# Patient Record
Sex: Male | Born: 1945
Health system: Southern US, Community
[De-identification: ages and names within clinical notes are randomized; demographics above are authoritative.]

## PROBLEM LIST (undated history)

## (undated) DIAGNOSIS — I77819 Aortic ectasia, unspecified site: Secondary | ICD-10-CM

## (undated) DIAGNOSIS — I10 Essential (primary) hypertension: Secondary | ICD-10-CM

## (undated) DIAGNOSIS — I77810 Thoracic aortic ectasia: Secondary | ICD-10-CM

## (undated) DIAGNOSIS — M1990 Unspecified osteoarthritis, unspecified site: Secondary | ICD-10-CM

## (undated) DIAGNOSIS — C801 Malignant (primary) neoplasm, unspecified: Secondary | ICD-10-CM

## (undated) DIAGNOSIS — I4510 Unspecified right bundle-branch block: Secondary | ICD-10-CM

## (undated) DIAGNOSIS — I5032 Chronic diastolic (congestive) heart failure: Secondary | ICD-10-CM

## (undated) DIAGNOSIS — K219 Gastro-esophageal reflux disease without esophagitis: Secondary | ICD-10-CM

## (undated) DIAGNOSIS — J45909 Unspecified asthma, uncomplicated: Secondary | ICD-10-CM

## (undated) DIAGNOSIS — G4733 Obstructive sleep apnea (adult) (pediatric): Secondary | ICD-10-CM

## (undated) DIAGNOSIS — I480 Paroxysmal atrial fibrillation: Secondary | ICD-10-CM

## (undated) HISTORY — DX: Chronic diastolic (congestive) heart failure: I50.32

## (undated) HISTORY — DX: Morbid (severe) obesity due to excess calories: E66.01

## (undated) HISTORY — PX: SHOULDER SURGERY: SHX246

## (undated) HISTORY — PX: CATARACT EXTRACTION W/ INTRAOCULAR LENS IMPLANT: SHX1309

## (undated) HISTORY — DX: Paroxysmal atrial fibrillation: I48.0

## (undated) HISTORY — PX: OTHER SURGICAL HISTORY: SHX169

## (undated) HISTORY — DX: Obstructive sleep apnea (adult) (pediatric): G47.33

## (undated) HISTORY — DX: Thoracic aortic ectasia: I77.810

## (undated) HISTORY — DX: Unspecified right bundle-branch block: I45.10

## (undated) HISTORY — DX: Aortic ectasia, unspecified site: I77.819

---

## 1998-02-22 ENCOUNTER — Emergency Department (HOSPITAL_COMMUNITY): Admission: EM | Admit: 1998-02-22 | Discharge: 1998-02-22 | Payer: Self-pay | Admitting: Emergency Medicine

## 2001-01-03 ENCOUNTER — Encounter: Payer: Self-pay | Admitting: Emergency Medicine

## 2001-01-03 ENCOUNTER — Emergency Department (HOSPITAL_COMMUNITY): Admission: EM | Admit: 2001-01-03 | Discharge: 2001-01-03 | Payer: Self-pay | Admitting: Emergency Medicine

## 2004-11-02 ENCOUNTER — Inpatient Hospital Stay (HOSPITAL_COMMUNITY): Admission: EM | Admit: 2004-11-02 | Discharge: 2004-11-11 | Payer: Self-pay | Admitting: Emergency Medicine

## 2005-06-18 ENCOUNTER — Encounter: Admission: RE | Admit: 2005-06-18 | Discharge: 2005-06-18 | Payer: Self-pay | Admitting: Internal Medicine

## 2005-10-13 ENCOUNTER — Encounter: Admission: RE | Admit: 2005-10-13 | Discharge: 2005-10-13 | Payer: Self-pay | Admitting: Gastroenterology

## 2006-10-21 ENCOUNTER — Encounter: Admission: RE | Admit: 2006-10-21 | Discharge: 2006-10-21 | Payer: Self-pay | Admitting: Internal Medicine

## 2010-08-30 ENCOUNTER — Encounter: Payer: Self-pay | Admitting: Gastroenterology

## 2012-08-16 ENCOUNTER — Other Ambulatory Visit: Payer: Self-pay | Admitting: Orthopedic Surgery

## 2012-08-16 DIAGNOSIS — M25512 Pain in left shoulder: Secondary | ICD-10-CM

## 2012-08-17 ENCOUNTER — Ambulatory Visit
Admission: RE | Admit: 2012-08-17 | Discharge: 2012-08-17 | Disposition: A | Discharge status: Home / Self Care | Payer: Medicare Other | Source: Ambulatory Visit | Attending: Orthopedic Surgery | Admitting: Orthopedic Surgery

## 2012-08-17 DIAGNOSIS — M25512 Pain in left shoulder: Secondary | ICD-10-CM

## 2012-08-24 ENCOUNTER — Ambulatory Visit
Admission: RE | Admit: 2012-08-24 | Discharge: 2012-08-24 | Disposition: A | Discharge status: Home / Self Care | Payer: Medicare Other | Source: Ambulatory Visit | Attending: Orthopedic Surgery | Admitting: Orthopedic Surgery

## 2012-11-29 ENCOUNTER — Emergency Department (HOSPITAL_COMMUNITY): Payer: Medicare Other

## 2012-11-29 ENCOUNTER — Inpatient Hospital Stay (HOSPITAL_COMMUNITY)
Admission: EM | Admit: 2012-11-29 | Discharge: 2012-12-02 | DRG: 419 | Disposition: A | Discharge status: Home / Self Care | Payer: Medicare Other | Attending: General Surgery | Admitting: General Surgery

## 2012-11-29 ENCOUNTER — Encounter (HOSPITAL_COMMUNITY): Payer: Self-pay | Admitting: *Deleted

## 2012-11-29 DIAGNOSIS — K8000 Calculus of gallbladder with acute cholecystitis without obstruction: Secondary | ICD-10-CM

## 2012-11-29 DIAGNOSIS — G4733 Obstructive sleep apnea (adult) (pediatric): Secondary | ICD-10-CM | POA: Diagnosis present

## 2012-11-29 DIAGNOSIS — Z7982 Long term (current) use of aspirin: Secondary | ICD-10-CM

## 2012-11-29 DIAGNOSIS — Z79899 Other long term (current) drug therapy: Secondary | ICD-10-CM

## 2012-11-29 DIAGNOSIS — K810 Acute cholecystitis: Principal | ICD-10-CM

## 2012-11-29 DIAGNOSIS — J45909 Unspecified asthma, uncomplicated: Secondary | ICD-10-CM | POA: Diagnosis present

## 2012-11-29 HISTORY — DX: Unspecified asthma, uncomplicated: J45.909

## 2012-11-29 LAB — CBC WITH DIFFERENTIAL/PLATELET
Eosinophils Relative: 0 % (ref 0–5)
HCT: 43.7 % (ref 39.0–52.0)
Hemoglobin: 15.1 g/dL (ref 13.0–17.0)
Lymphs Abs: 0.8 10*3/uL (ref 0.7–4.0)
MCH: 28.1 pg (ref 26.0–34.0)
MCV: 81.2 fL (ref 78.0–100.0)
Monocytes Absolute: 2 10*3/uL — ABNORMAL HIGH (ref 0.1–1.0)
Neutro Abs: 17.3 10*3/uL — ABNORMAL HIGH (ref 1.7–7.7)
Platelets: 208 10*3/uL (ref 150–400)
RBC: 5.38 MIL/uL (ref 4.22–5.81)
RDW: 13.9 % (ref 11.5–15.5)
WBC: 20.1 10*3/uL — ABNORMAL HIGH (ref 4.0–10.5)

## 2012-11-29 LAB — COMPREHENSIVE METABOLIC PANEL
ALT: 21 U/L (ref 0–53)
AST: 20 U/L (ref 0–37)
Alkaline Phosphatase: 64 U/L (ref 39–117)
BUN: 24 mg/dL — ABNORMAL HIGH (ref 6–23)
CO2: 22 mEq/L (ref 19–32)
Calcium: 9.1 mg/dL (ref 8.4–10.5)
Creatinine, Ser: 0.96 mg/dL (ref 0.50–1.35)
Glucose, Bld: 140 mg/dL — ABNORMAL HIGH (ref 70–99)
Potassium: 3.8 mEq/L (ref 3.5–5.1)
Sodium: 132 mEq/L — ABNORMAL LOW (ref 135–145)
Total Protein: 6.9 g/dL (ref 6.0–8.3)

## 2012-11-29 MED ORDER — HYDROMORPHONE HCL PF 1 MG/ML IJ SOLN
1.0000 mg | Freq: Once | INTRAMUSCULAR | Status: AC
  Administered 2012-11-29: 1 mg via INTRAVENOUS
  Filled 2012-11-29: qty 1

## 2012-11-29 MED ORDER — PIPERACILLIN-TAZOBACTAM 3.375 G IVPB 30 MIN
3.3750 g | Freq: Once | INTRAVENOUS | Status: AC
  Administered 2012-11-29: 3.375 g via INTRAVENOUS
  Filled 2012-11-29: qty 50

## 2012-11-29 MED ORDER — MORPHINE SULFATE 2 MG/ML IJ SOLN
2.0000 mg | INTRAMUSCULAR | Status: DC | PRN
  Administered 2012-11-29 – 2012-11-30 (×2): 2 mg via INTRAVENOUS
  Filled 2012-11-29 (×2): qty 1

## 2012-11-29 MED ORDER — ONDANSETRON HCL 4 MG/2ML IJ SOLN
4.0000 mg | Freq: Once | INTRAMUSCULAR | Status: AC
  Administered 2012-11-29: 4 mg via INTRAVENOUS
  Filled 2012-11-29: qty 2

## 2012-11-29 MED ORDER — PIPERACILLIN-TAZOBACTAM 3.375 G IVPB 30 MIN: 3.3750 g | Freq: Once | INTRAVENOUS | Status: DC

## 2012-11-29 MED ORDER — ACETAMINOPHEN 650 MG RE SUPP: 650.0000 mg | Freq: Four times a day (QID) | RECTAL | Status: DC | PRN

## 2012-11-29 MED ORDER — SODIUM CHLORIDE 0.9 % IV BOLUS (SEPSIS)
1000.0000 mL | Freq: Once | INTRAVENOUS | Status: AC
  Administered 2012-11-29: 1000 mL via INTRAVENOUS

## 2012-11-29 MED ORDER — ONDANSETRON HCL 4 MG/2ML IJ SOLN
4.0000 mg | Freq: Four times a day (QID) | INTRAMUSCULAR | Status: DC | PRN
  Administered 2012-12-01: 4 mg via INTRAVENOUS
  Filled 2012-11-29: qty 2

## 2012-11-29 MED ORDER — PIPERACILLIN-TAZOBACTAM 3.375 G IVPB
3.3750 g | Freq: Three times a day (TID) | INTRAVENOUS | Status: DC
  Administered 2012-11-30 – 2012-12-02 (×7): 3.375 g via INTRAVENOUS
  Filled 2012-11-29 (×10): qty 50

## 2012-11-29 MED ORDER — HEPARIN SODIUM (PORCINE) 5000 UNIT/ML IJ SOLN
5000.0000 [IU] | Freq: Three times a day (TID) | INTRAMUSCULAR | Status: DC
  Administered 2012-11-30 – 2012-12-02 (×6): 5000 [IU] via SUBCUTANEOUS
  Filled 2012-11-29 (×12): qty 1

## 2012-11-29 MED ORDER — PANTOPRAZOLE SODIUM 40 MG IV SOLR
40.0000 mg | Freq: Every day | INTRAVENOUS | Status: DC
  Administered 2012-11-30 – 2012-12-01 (×3): 40 mg via INTRAVENOUS
  Filled 2012-11-29 (×4): qty 40

## 2012-11-29 MED ORDER — ACETAMINOPHEN 325 MG PO TABS: 650.0000 mg | ORAL_TABLET | Freq: Four times a day (QID) | ORAL | Status: DC | PRN

## 2012-11-29 MED ORDER — SODIUM CHLORIDE 0.9 % IV SOLN
INTRAVENOUS | Status: DC
  Administered 2012-11-30 (×2): 75 mL/h via INTRAVENOUS
  Administered 2012-12-02: 09:00:00 via INTRAVENOUS

## 2012-11-29 NOTE — ED Notes (Signed)
Pt has CBC and urinalysis results from today here with him.

## 2012-11-29 NOTE — H&P (Addendum)
Victor Medina is an 67 y.o. male.   Chief Complaint: ruq pain, n/v HPI: 34 yom who presents with first episode of ruq pain.  He ate dinner on Easter and then had n/v overnight.  Since then he has had ruq pain that has been unrelenting and worsening.  He has not had more n/v.  Has had low grade fevers.  There have been no relieving factors.  Aggravated by palpation.  He has not really eaten anything since Sunday either.  He denies any prior symptoms.    Past Medical History  Diagnosis Date  . Asthma     Past Surgical History  Procedure Laterality Date  . Shoulder surgery    recently with Dr. Ave Filter  History reviewed. No pertinent family history. Social History:  reports that he has never smoked. He does not have any smokeless tobacco history on file. He reports that  drinks alcohol. He reports that he does not use illicit drugs.  Allergies: No Known Allergies  Meds advair  Results for orders placed during the hospital encounter of 11/29/12 (from the past 48 hour(s))  COMPREHENSIVE METABOLIC PANEL     Status: Abnormal   Collection Time    11/29/12  7:44 PM      Result Value Range   Sodium 132 (*) 135 - 145 mEq/L   Potassium 3.8  3.5 - 5.1 mEq/L   Chloride 96  96 - 112 mEq/L   CO2 22  19 - 32 mEq/L   Glucose, Bld 140 (*) 70 - 99 mg/dL   BUN 24 (*) 6 - 23 mg/dL   Creatinine, Ser 1.61  0.50 - 1.35 mg/dL   Calcium 9.1  8.4 - 09.6 mg/dL   Total Protein 6.9  6.0 - 8.3 g/dL   Albumin 3.3 (*) 3.5 - 5.2 g/dL   AST 20  0 - 37 U/L   ALT 21  0 - 53 U/L   Alkaline Phosphatase 64  39 - 117 U/L   Total Bilirubin 1.6 (*) 0.3 - 1.2 mg/dL   GFR calc non Af Amer 84 (*) >90 mL/min   GFR calc Af Amer >90  >90 mL/min   Comment:            The eGFR has been calculated     using the CKD EPI equation.     This calculation has not been     validated in all clinical     situations.     eGFR's persistently     <90 mL/min signify     possible Chronic Kidney Disease.  LIPASE, BLOOD      Status: None   Collection Time    11/29/12  7:44 PM      Result Value Range   Lipase 11  11 - 59 U/L  CBC WITH DIFFERENTIAL     Status: Abnormal   Collection Time    11/29/12  9:09 PM      Result Value Range   WBC 20.1 (*) 4.0 - 10.5 K/uL   Comment: WHITE COUNT CONFIRMED ON SMEAR   RBC 5.38  4.22 - 5.81 MIL/uL   Hemoglobin 15.1  13.0 - 17.0 g/dL   HCT 04.5  40.9 - 81.1 %   MCV 81.2  78.0 - 100.0 fL   MCH 28.1  26.0 - 34.0 pg   MCHC 34.6  30.0 - 36.0 g/dL   RDW 91.4  78.2 - 95.6 %   Platelets 208  150 - 400 K/uL  Neutrophils Relative 86 (*) 43 - 77 %   Lymphocytes Relative 4 (*) 12 - 46 %   Monocytes Relative 10  3 - 12 %   Eosinophils Relative 0  0 - 5 %   Basophils Relative 0  0 - 1 %   Smear Review MORPHOLOGY UNREMARKABLE     Neutro Abs 17.3 (*) 1.7 - 7.7 K/uL   Lymphs Abs 0.8  0.7 - 4.0 K/uL   Monocytes Absolute 2.0 (*) 0.1 - 1.0 K/uL   Eosinophils Absolute 0.0  0.0 - 0.7 K/uL   Basophils Absolute 0.0  0.0 - 0.1 K/uL   US Abdomen Complete  11/29/2012  *RADIOLOGY REPORT*  Clinical Data:  Right upper quadrant abdominal pain  COMPLETE ABDOMINAL ULTRASOUND  Comparison:  None.  Findings:  Gallbladder:  The gallbladder is distended with echogenic debris. There is gallbladder wall thickening at 7 mm.  Negative Murphy's sign per the sonographer.  Common bile duct:  Proximally measures 4.5 mm. Distal duct obscured.  Liver:  Enlarged and increased in echogenicity.  Focal lesion detection is limited in this setting.  IVC:  Poorly visualized.  Pancreas:  Inadequately visualized due to limited acoustic windows/bowel gas artifact.  Spleen:  Measures 9.2 cm.  Right Kidney:  Measures 11.7 cm.  No hydronephrosis or focal abnormality.  Left Kidney:  Measures 12 cm.  No hydronephrosis or focal abnormality.  Abdominal aorta:  Poorly visualized due to limited acoustic windows/bowel gas artifact.  The mid/distal aorta was obscured. Proximally, measures approximately 2.6 cm.  IMPRESSION: Technically  challenging examination due to patient body habitus and bowel gas artifact, resulting limited acoustic windows as above.  The gallbladder is distended with echogenic debris (sludge , blood, or pus) and thick-walled.  No shadowing gallstones. Acute cholecystitis not excluded.  Hepatic steatosis.   Original Report Authenticated By: Jearld Lesch, M.D.     Review of Systems  Constitutional: Positive for fever and malaise/fatigue. Negative for chills.  Respiratory: Negative for cough.   Cardiovascular: Positive for leg swelling. Negative for chest pain and palpitations.  Gastrointestinal: Positive for nausea, vomiting and abdominal pain.    Blood pressure 136/76, pulse 96, temperature 100.8 F (38.2 C), temperature source Oral, resp. rate 20, SpO2 99.00%. Physical Exam  Vitals reviewed. Constitutional: He is oriented to person, place, and time. He appears well-developed and well-nourished.  Eyes: No scleral icterus.  Neck: Neck supple.  Cardiovascular: Normal rate, regular rhythm and normal heart sounds.   Respiratory: Effort normal and breath sounds normal. He has no wheezes.  GI: Normal appearance and bowel sounds are normal. He exhibits no distension. There is tenderness in the right upper quadrant. There is positive Murphy's sign.  Musculoskeletal: Normal range of motion. He exhibits edema.  Lymphadenopathy:    He has no cervical adenopathy.  Neurological: He is alert and oriented to person, place, and time.  Skin: He is not diaphoretic.     Assessment/Plan Acute cholecystitis  I discussed the procedure in detail.  Will place on antibiotics, npo plan lap chole possible open chole which I quoted 10-20% risk of.  We discussed the risks and benefits of a laparoscopic cholecystectomy and possible cholangiogram including, but not limited to bleeding, infection, injury to surrounding structures such as the intestine or liver, bile leak, retained gallstones, need to convert to an open  procedure, prolonged diarrhea, blood clots such as  DVT, common bile duct injury, anesthesia risks, and possible need for additional procedures.  The likelihood of improvement in symptoms  and return to the patient's normal status is good. We discussed the typical post-operative recovery course.   Victor Medina 11/29/2012, 11:25 PM

## 2012-11-29 NOTE — ED Notes (Signed)
Surgery in to assess - wife remains at bedside

## 2012-11-29 NOTE — ED Notes (Signed)
Pt began vomiting Sunday night through Monday after traveling for Easter.  Since then, has very little appetite and RLQ abdominal pain.  Denies n/v/d at this time.

## 2012-11-29 NOTE — ED Provider Notes (Signed)
History     CSN: [id number]  Arrival date & time 11/29/12  4098   First MD Initiated Contact with Patient 11/29/12 2049      Chief Complaint  Patient presents with  . Abdominal Pain    (Consider location/radiation/quality/duration/timing/severity/associated sxs/prior treatment) Patient is a 67 y.o. male presenting with abdominal pain. The history is provided by the patient and the spouse.  Abdominal Pain Pain location:  RUQ Pain quality: aching and fullness   Pain radiates to:  Does not radiate Pain severity:  Moderate Onset quality:  Gradual Duration:  3 days Timing:  Constant Progression:  Worsening Chronicity:  New Context comment:  Biliary colic 40 yrs ago Worsened by:  Palpation and movement Ineffective treatments:  None tried Associated symptoms: anorexia, chills, fatigue, fever and nausea   Associated symptoms: no chest pain, no constipation, no cough, no diarrhea, no shortness of breath and no vomiting   Risk factors: obesity     Past Medical History  Diagnosis Date  . Asthma     Past Surgical History  Procedure Laterality Date  . Shoulder surgery      History reviewed. No pertinent family history.  History  Substance Use Topics  . Smoking status: Never Smoker   . Smokeless tobacco: Not on file  . Alcohol Use: Yes     Comment: rarely      Review of Systems  Constitutional: Positive for fever, chills, activity change, appetite change and fatigue. Negative for diaphoresis.  HENT: Negative for neck pain.   Respiratory: Negative for cough, chest tightness, shortness of breath and wheezing.   Cardiovascular: Negative for chest pain, palpitations and leg swelling.  Gastrointestinal: Positive for nausea, abdominal pain (RUQ) and anorexia. Negative for vomiting, diarrhea, constipation, blood in stool, abdominal distention and anal bleeding.  Skin: Negative for rash and wound.  Neurological: Negative for dizziness, syncope, weakness, light-headedness and  headaches.  All other systems reviewed and are negative.    Allergies  Review of patient's allergies indicates no known allergies.  Home Medications   Current Outpatient Rx  Name  Route  Sig  Dispense  Refill  . Fluticasone-Salmeterol (ADVAIR) 250-50 MCG/DOSE AEPB   Inhalation   Inhale 1 puff into the lungs every 12 (twelve) hours.           BP 129/72  Pulse 100  Temp(Src) 98.8 F (37.1 C) (Oral)  Resp 20  SpO2 97%  Physical Exam  Nursing note and vitals reviewed. Constitutional: He appears well-developed and well-nourished.  HENT:  Head: Normocephalic and atraumatic.  Right Ear: External ear normal.  Left Ear: External ear normal.  Nose: Nose normal.  Mouth/Throat: Oropharynx is clear and moist. No oropharyngeal exudate.  Scleral icterus   Eyes: Conjunctivae are normal. Pupils are equal, round, and reactive to light.  Neck: Normal range of motion. Neck supple.  Cardiovascular: Normal rate, regular rhythm, normal heart sounds and intact distal pulses.  Exam reveals no gallop and no friction rub.   No murmur heard. Pulmonary/Chest: Effort normal and breath sounds normal. No respiratory distress. He has no wheezes. He has no rales. He exhibits no tenderness.  Abdominal: Soft. Bowel sounds are normal. He exhibits no distension and no mass. There is tenderness (RUQ; Positive Murphy's sign). There is no rebound and no guarding.  Musculoskeletal: Normal range of motion. He exhibits no edema and no tenderness.  Neurological: He is alert. He displays normal reflexes. No cranial nerve deficit. He exhibits normal muscle tone. Coordination normal.  Skin: Skin  is warm and dry. No rash noted. No erythema. No pallor.  Psychiatric: He has a normal mood and affect. His behavior is normal. Judgment and thought content normal.    ED Course  Procedures (including critical care time)  Labs Reviewed  COMPREHENSIVE METABOLIC PANEL - Abnormal; Notable for the following:    Sodium 132  (*)    Glucose, Bld 140 (*)    BUN 24 (*)    Albumin 3.3 (*)    Total Bilirubin 1.6 (*)    GFR calc non Af Amer 84 (*)    All other components within normal limits  CBC WITH DIFFERENTIAL - Abnormal; Notable for the following:    WBC 20.1 (*)    Neutrophils Relative 86 (*)    Lymphocytes Relative 4 (*)    Neutro Abs 17.3 (*)    Monocytes Absolute 2.0 (*)    All other components within normal limits  CULTURE, BLOOD (ROUTINE X 2)  CULTURE, BLOOD (ROUTINE X 2)  LIPASE, BLOOD    Date: 11/30/2012  Rate: 95 bpm  Rhythm: normal sinus rhythm  QRS Axis: left  Intervals: normal  ST/T Wave abnormalities: normal  Conduction Disutrbances:right bundle branch block  Narrative Interpretation: Normal sinus rhythm with right bundle branch block  Old EKG Reviewed: none available    US Abdomen Complete  11/29/2012  *RADIOLOGY REPORT*  Clinical Data:  Right upper quadrant abdominal pain  COMPLETE ABDOMINAL ULTRASOUND  Comparison:  None.  Findings:  Gallbladder:  The gallbladder is distended with echogenic debris. There is gallbladder wall thickening at 7 mm.  Negative Murphy's sign per the sonographer.  Common bile duct:  Proximally measures 4.5 mm. Distal duct obscured.  Liver:  Enlarged and increased in echogenicity.  Focal lesion detection is limited in this setting.  IVC:  Poorly visualized.  Pancreas:  Inadequately visualized due to limited acoustic windows/bowel gas artifact.  Spleen:  Measures 9.2 cm.  Right Kidney:  Measures 11.7 cm.  No hydronephrosis or focal abnormality.  Left Kidney:  Measures 12 cm.  No hydronephrosis or focal abnormality.  Abdominal aorta:  Poorly visualized due to limited acoustic windows/bowel gas artifact.  The mid/distal aorta was obscured. Proximally, measures approximately 2.6 cm.  IMPRESSION: Technically challenging examination due to patient body habitus and bowel gas artifact, resulting limited acoustic windows as above.  The gallbladder is distended with echogenic  debris (sludge , blood, or pus) and thick-walled.  No shadowing gallstones. Acute cholecystitis not excluded.  Hepatic steatosis.   Original Report Authenticated By: Jearld Lesch, M.D.      1. Acute cholecystitis     MDM  67 yo M presents for 4 days of RUQ abdominal pain with associated nausea/vomiting. Limited PO intake since onset of symptoms. AFVSS. Scleral icterus on exam. TTP exquisitely over RUQ. Not peritonic. Concern for acute cholangitis vs acute cholecystitis; will obtain labs, RUQ ultrasound, and administer IVF, analgesics, anti-emetics.   Imaging c/w acute cholecystitis. Blood cultures obtained and IV Zosyn administered. General Surgery to admit.         Clemetine Marker, MD 11/30/12 [phone]

## 2012-11-30 ENCOUNTER — Encounter (HOSPITAL_COMMUNITY): Payer: Self-pay | Admitting: *Deleted

## 2012-11-30 ENCOUNTER — Inpatient Hospital Stay (HOSPITAL_COMMUNITY): Payer: Medicare Other

## 2012-11-30 LAB — COMPREHENSIVE METABOLIC PANEL
AST: 75 U/L — ABNORMAL HIGH (ref 0–37)
Albumin: 3 g/dL — ABNORMAL LOW (ref 3.5–5.2)
BUN: 26 mg/dL — ABNORMAL HIGH (ref 6–23)
Calcium: 8.6 mg/dL (ref 8.4–10.5)
Creatinine, Ser: 1.03 mg/dL (ref 0.50–1.35)
Total Bilirubin: 1.9 mg/dL — ABNORMAL HIGH (ref 0.3–1.2)
Total Protein: 6.3 g/dL (ref 6.0–8.3)

## 2012-11-30 LAB — CBC
Hemoglobin: 14.9 g/dL (ref 13.0–17.0)
MCH: 28.6 pg (ref 26.0–34.0)
MCV: 82.5 fL (ref 78.0–100.0)
Platelets: 209 10*3/uL (ref 150–400)
RBC: 5.21 MIL/uL (ref 4.22–5.81)
WBC: 20.7 10*3/uL — ABNORMAL HIGH (ref 4.0–10.5)

## 2012-11-30 LAB — SURGICAL PCR SCREEN
MRSA, PCR: NEGATIVE
Staphylococcus aureus: POSITIVE — AB

## 2012-11-30 MED ORDER — HYDROMORPHONE HCL PF 1 MG/ML IJ SOLN
1.0000 mg | INTRAMUSCULAR | Status: DC | PRN
  Administered 2012-11-30 – 2012-12-02 (×11): 1 mg via INTRAVENOUS
  Filled 2012-11-30 (×11): qty 1

## 2012-11-30 MED ORDER — MUPIROCIN 2 % EX OINT
1.0000 "application " | TOPICAL_OINTMENT | Freq: Two times a day (BID) | CUTANEOUS | Status: DC
  Administered 2012-11-30 – 2012-12-02 (×3): 1 via NASAL
  Filled 2012-11-30: qty 22

## 2012-11-30 MED ORDER — CHLORHEXIDINE GLUCONATE CLOTH 2 % EX PADS
6.0000 | MEDICATED_PAD | Freq: Every day | CUTANEOUS | Status: DC
  Administered 2012-12-01 – 2012-12-02 (×2): 6 via TOPICAL

## 2012-11-30 NOTE — Progress Notes (Signed)
UR complete. Ronny Flurry RN BSN

## 2012-11-30 NOTE — Progress Notes (Signed)
ANTIBIOTIC CONSULT NOTE - INITIAL  Pharmacy Consult for Zosyn Indication: Cholecystitis  No Known Allergies  Patient Measurements: Height: 6\' 1"  (185.4 cm) Weight: 343 lb 7.6 oz (155.8 kg) (bed weight) IBW/kg (Calculated) : 79.9 Adjusted Body Weight:   Vital Signs: Temp: 98.1 F (36.7 C) (04/24 1337) Temp src: Oral (04/24 1337) BP: 158/82 mmHg (04/24 1337) Pulse Rate: 101 (04/24 1337) Intake/Output from previous day:   Intake/Output from this shift: Total I/O In: 867.5 [P.O.:120; I.V.:697.5; IV Piggyback:50] Out: -   Labs:  Recent Labs  11/29/12 1944 11/29/12 2109 11/30/12 0535  WBC  --  20.1* 20.7*  HGB  --  15.1 14.9  PLT  --  208 209  CREATININE 0.96  --  1.03   Estimated Creatinine Clearance: 108.6 ml/min (by C-G formula based on Cr of 1.03). No results found for this basename: VANCOTROUGH, Leodis Binet, VANCORANDOM, GENTTROUGH, GENTPEAK, GENTRANDOM, TOBRATROUGH, TOBRAPEAK, TOBRARND, AMIKACINPEAK, AMIKACINTROU, AMIKACIN,  in the last 72 hours   Microbiology: Recent Results (from the past 720 hour(s))  SURGICAL PCR SCREEN     Status: Abnormal   Collection Time    11/30/12 12:51 AM      Result Value Range Status   MRSA, PCR NEGATIVE  NEGATIVE Final   Staphylococcus aureus POSITIVE (*) NEGATIVE Final   Comment:            The Xpert SA Assay (FDA     approved for NASAL specimens     in patients over 58 years of age),     is one component of     a comprehensive surveillance     program.  Test performance has     been validated by [employer] for patients greater     than or equal to 62 year old.     It is not intended     to diagnose infection nor to     guide or monitor treatment.    Medical History: Past Medical History  Diagnosis Date  . Asthma   . CHF (congestive heart failure)   . Acute CHF (congestive heart failure)     Medications:  Scheduled:  . Chlorhexidine Gluconate Cloth  6 each Topical Daily  . heparin  5,000 Units Subcutaneous  Q8H  . [COMPLETED]  HYDROmorphone (DILAUDID) injection  1 mg Intravenous Once  . mupirocin ointment  1 application Nasal BID  . [COMPLETED] ondansetron (ZOFRAN) IV  4 mg Intravenous Once  . pantoprazole (PROTONIX) IV  40 mg Intravenous QHS  . [COMPLETED] piperacillin-tazobactam  3.375 g Intravenous Once  . piperacillin-tazobactam  3.375 g Intravenous Once  . piperacillin-tazobactam (ZOSYN)  IV  3.375 g Intravenous Q8H  . [COMPLETED] sodium chloride  1,000 mL Intravenous Once  . [COMPLETED] sodium chloride  1,000 mL Intravenous Once   Assessment: 67yo male with cholecystitis, receiving Zosyn 3.375gm IV q8 with 4hr infusion.  Dose is appropriate for renal function.  Plan is for OR on Friday.  Goal of Therapy:  Resolution of infection  Plan:  1.  Continue Zosyn at current dose 2.  F/U in AM  Marisue Humble, PharmD Clinical Pharmacist [facility]

## 2012-11-30 NOTE — Progress Notes (Signed)
[facility] Progress Note    Subjective: Patient wants surgery right away.  I believe that he would benefit from 24 hours of antibiotics.  Objective: Vital signs in last 24 hours: Temp:  [98.6 F (37 C)-100.8 F (38.2 C)] 98.6 F (37 C) (04/24 0558) Pulse Rate:  [94-114] 102 (04/24 0558) Resp:  [18-20] 20 (04/24 0558) BP: (129-157)/(72-78) 138/74 mmHg (04/24 0558) SpO2:  [96 %-99 %] 96 % (04/24 0558) Weight:  [155.8 kg (343 lb 7.6 oz)] 155.8 kg (343 lb 7.6 oz) (04/24 0017) Last BM Date: 11/26/12  Intake/Output from previous day:   Intake/Output this shift:    General: Mild acute distress  Lungs: Clear.  On CPAP machine.  Abd: Soft except for RUQ where there is a distinct fullness around the inflamed gallbladder.  Extremities: No DVT signs or symptoms  Neuro: Intact  Lab Results:  @LABLAST2 (wbc:2,hgb:2,hct:2,plt:2) BMET  Recent Labs  11/29/12 1944 11/30/12 0535  NA 132* 137  K 3.8 3.6  CL 96 102  CO2 22 25  GLUCOSE 140* 125*  BUN 24* 26*  CREATININE 0.96 1.03  CALCIUM 9.1 8.6   PT/INR No results found for this basename: LABPROT, INR,  in the last 72 hours ABG No results found for this basename: PHART, PCO2, PO2, HCO3,  in the last 72 hours  Studies/Results: US Abdomen Complete  11/29/2012  *RADIOLOGY REPORT*  Clinical Data:  Right upper quadrant abdominal pain  COMPLETE ABDOMINAL ULTRASOUND  Comparison:  None.  Findings:  Gallbladder:  The gallbladder is distended with echogenic debris. There is gallbladder wall thickening at 7 mm.  Negative Murphy's sign per the sonographer.  Common bile duct:  Proximally measures 4.5 mm. Distal duct obscured.  Liver:  Enlarged and increased in echogenicity.  Focal lesion detection is limited in this setting.  IVC:  Poorly visualized.  Pancreas:  Inadequately visualized due to limited acoustic windows/bowel gas artifact.  Spleen:  Measures 9.2 cm.  Right Kidney:  Measures 11.7 cm.  No hydronephrosis or focal abnormality.  Left  Kidney:  Measures 12 cm.  No hydronephrosis or focal abnormality.  Abdominal aorta:  Poorly visualized due to limited acoustic windows/bowel gas artifact.  The mid/distal aorta was obscured. Proximally, measures approximately 2.6 cm.  IMPRESSION: Technically challenging examination due to patient body habitus and bowel gas artifact, resulting limited acoustic windows as above.  The gallbladder is distended with echogenic debris (sludge , blood, or pus) and thick-walled.  No shadowing gallstones. Acute cholecystitis not excluded.  Hepatic steatosis.   Original Report Authenticated By: Jearld Lesch, M.D.    Dg Chest Port 1 View  11/30/2012  *RADIOLOGY REPORT*  Clinical Data: Preoperative for cholecystectomy  PORTABLE CHEST - 1 VIEW  Comparison: 02/08/2012  Findings: Cardiomegaly.  Central vascular congestion.  Aortic atherosclerosis.  Mild lung base opacity on the left.  Interstitial prominence may be accentuated by overlying soft tissues.  No pleural effusion or pneumothorax.  No acute osseous finding.  IMPRESSION: Cardiomediastinal prominence, similar to prior.  Mild lung base opacity; atelectasis (favored) versus infiltrate.   Original Report Authenticated By: Jearld Lesch, M.D.     Anti-infectives: Anti-infectives   Start     Dose/Rate Route Frequency Ordered Stop   11/30/12 0600  piperacillin-tazobactam (ZOSYN) IVPB 3.375 g     3.375 g 12.5 mL/hr over 240 Minutes Intravenous 3 times per day 11/29/12 2334     11/29/12 2345  piperacillin-tazobactam (ZOSYN) IVPB 3.375 g     3.375 g 100 mL/hr over 30 Minutes  Intravenous  Once 11/29/12 2333     11/29/12 2300  piperacillin-tazobactam (ZOSYN) IVPB 3.375 g     3.375 g 100 mL/hr over 30 Minutes Intravenous  Once 11/29/12 2259 11/29/12 2329      Assessment/Plan: s/p  Advance diet NPO after MNContinue antibiotics for 24 hours.   Add to surgery schedule for tomorrow AM.  LOS: 1 day   Marta Lamas. Gae Bon, MD,  FACS [phone] [facility] 11/30/2012

## 2012-11-30 NOTE — Progress Notes (Signed)
Pt placed self on home CPAP and is resting comfortably.

## 2012-11-30 NOTE — Progress Notes (Signed)
Nutrition Brief Note  Patient identified on the Malnutrition Screening Tool (MST) Report  Body mass index is 45.33 kg/(m^2). Patient meets criteria for morbid obesity based on current BMI.   Current diet order is NPO for surgery. Labs and medications reviewed.   Pt states he hasn't had anything to eat in 3 days.  Pt reports some hunger, however no interest in food due to pain.  Pt is scheduled for gall bladder surgery tomorrow and is anticipating resuming diet. No nutrition interventions warranted at this time. If nutrition issues arise, please consult RD.   Loyce Dys, MS RD LDN Clinical Inpatient Dietitian Pager: [phone] Weekend/After hours pager: [phone]

## 2012-11-30 NOTE — Progress Notes (Signed)
Respiratory protocol assessment complete. No respiratory interventions indicated. Patient has asthma hx and takes Advair at home. Patient says he will not be taking any maintenance inhalers while at the hospital. Patient has a home CPAP.

## 2012-12-01 ENCOUNTER — Encounter (HOSPITAL_COMMUNITY): Admission: EM | Disposition: A | Discharge status: Home / Self Care | Payer: Self-pay | Source: Home / Self Care

## 2012-12-01 ENCOUNTER — Encounter (HOSPITAL_COMMUNITY): Payer: Self-pay | Admitting: Anesthesiology

## 2012-12-01 ENCOUNTER — Inpatient Hospital Stay (HOSPITAL_COMMUNITY): Payer: Medicare Other | Admitting: Anesthesiology

## 2012-12-01 HISTORY — PX: CHOLECYSTECTOMY: SHX55

## 2012-12-01 SURGERY — LAPAROSCOPIC CHOLECYSTECTOMY WITH INTRAOPERATIVE CHOLANGIOGRAM
Anesthesia: General

## 2012-12-01 SURGERY — LAPAROSCOPIC CHOLECYSTECTOMY
Anesthesia: General | Site: Abdomen | Wound class: Contaminated

## 2012-12-01 MED ORDER — PHENYLEPHRINE HCL 10 MG/ML IJ SOLN
INTRAMUSCULAR | Status: DC | PRN
  Administered 2012-12-01 (×2): 120 ug via INTRAVENOUS
  Administered 2012-12-01: 80 ug via INTRAVENOUS

## 2012-12-01 MED ORDER — GLYCOPYRROLATE 0.2 MG/ML IJ SOLN
INTRAMUSCULAR | Status: DC | PRN
  Administered 2012-12-01: 0.6 mg via INTRAVENOUS

## 2012-12-01 MED ORDER — ROCURONIUM BROMIDE 100 MG/10ML IV SOLN
INTRAVENOUS | Status: DC | PRN
  Administered 2012-12-01: 50 mg via INTRAVENOUS

## 2012-12-01 MED ORDER — SODIUM CHLORIDE 0.9 % IR SOLN
Status: DC | PRN
  Administered 2012-12-01 (×2): 1000 mL
  Administered 2012-12-01: 3000 mL

## 2012-12-01 MED ORDER — LACTATED RINGERS IV SOLN: INTRAVENOUS | Status: DC

## 2012-12-01 MED ORDER — FENTANYL CITRATE 0.05 MG/ML IJ SOLN
INTRAMUSCULAR | Status: DC | PRN
  Administered 2012-12-01: 100 ug via INTRAVENOUS
  Administered 2012-12-01 (×3): 50 ug via INTRAVENOUS

## 2012-12-01 MED ORDER — LACTATED RINGERS IV SOLN
INTRAVENOUS | Status: DC | PRN
  Administered 2012-12-01 (×2): via INTRAVENOUS

## 2012-12-01 MED ORDER — NEOSTIGMINE METHYLSULFATE 1 MG/ML IJ SOLN
INTRAMUSCULAR | Status: DC | PRN
  Administered 2012-12-01: 4 mg via INTRAVENOUS

## 2012-12-01 MED ORDER — LIDOCAINE HCL (CARDIAC) 20 MG/ML IV SOLN
INTRAVENOUS | Status: DC | PRN
  Administered 2012-12-01: 100 mg via INTRAVENOUS

## 2012-12-01 MED ORDER — ONDANSETRON HCL 4 MG/2ML IJ SOLN
INTRAMUSCULAR | Status: AC
  Filled 2012-12-01: qty 2

## 2012-12-01 MED ORDER — ONDANSETRON HCL 4 MG/2ML IJ SOLN
INTRAMUSCULAR | Status: DC | PRN
  Administered 2012-12-01: 4 mg via INTRAVENOUS

## 2012-12-01 MED ORDER — HYDROMORPHONE HCL PF 1 MG/ML IJ SOLN
0.2500 mg | INTRAMUSCULAR | Status: DC | PRN
  Administered 2012-12-01 (×2): 0.5 mg via INTRAVENOUS

## 2012-12-01 MED ORDER — 0.9 % SODIUM CHLORIDE (POUR BTL) OPTIME
TOPICAL | Status: DC | PRN
  Administered 2012-12-01: 1000 mL

## 2012-12-01 MED ORDER — SUCCINYLCHOLINE CHLORIDE 20 MG/ML IJ SOLN
INTRAMUSCULAR | Status: DC | PRN
  Administered 2012-12-01: 140 mg via INTRAVENOUS

## 2012-12-01 MED ORDER — HEMOSTATIC AGENTS (NO CHARGE) OPTIME
TOPICAL | Status: DC | PRN
  Administered 2012-12-01: 1 via TOPICAL

## 2012-12-01 MED ORDER — MIDAZOLAM HCL 5 MG/5ML IJ SOLN
INTRAMUSCULAR | Status: DC | PRN
  Administered 2012-12-01 (×2): 1 mg via INTRAVENOUS

## 2012-12-01 MED ORDER — OXYCODONE HCL 5 MG/5ML PO SOLN: 5.0000 mg | Freq: Once | ORAL | Status: AC | PRN

## 2012-12-01 MED ORDER — OXYCODONE HCL 5 MG PO TABS: 5.0000 mg | ORAL_TABLET | Freq: Once | ORAL | Status: AC | PRN

## 2012-12-01 MED ORDER — HYDROCODONE-ACETAMINOPHEN 5-325 MG PO TABS
1.0000 | ORAL_TABLET | ORAL | Status: DC | PRN
  Administered 2012-12-02 (×2): 2 via ORAL
  Filled 2012-12-01 (×2): qty 2

## 2012-12-01 MED ORDER — HYDROMORPHONE HCL PF 1 MG/ML IJ SOLN
INTRAMUSCULAR | Status: AC
  Administered 2012-12-01: 0.5 mg via INTRAVENOUS
  Filled 2012-12-01: qty 1

## 2012-12-01 MED ORDER — BUPIVACAINE-EPINEPHRINE 0.25% -1:200000 IJ SOLN
INTRAMUSCULAR | Status: DC | PRN
  Administered 2012-12-01: 20 mL

## 2012-12-01 MED ORDER — VECURONIUM BROMIDE 10 MG IV SOLR
INTRAVENOUS | Status: DC | PRN
  Administered 2012-12-01 (×2): 2 mg via INTRAVENOUS

## 2012-12-01 MED ORDER — PROMETHAZINE HCL 25 MG/ML IJ SOLN: 6.2500 mg | INTRAMUSCULAR | Status: DC | PRN

## 2012-12-01 MED ORDER — PROPOFOL 10 MG/ML IV BOLUS
INTRAVENOUS | Status: DC | PRN
  Administered 2012-12-01: 200 mg via INTRAVENOUS

## 2012-12-01 SURGICAL SUPPLY — 52 items
ADH SKN CLS APL DERMABOND .7 (GAUZE/BANDAGES/DRESSINGS) ×2
APPLIER CLIP 5 13 M/L LIGAMAX5 (MISCELLANEOUS) ×3
APPLIER CLIP ROT 10 11.4 M/L (STAPLE) ×3
APR CLP MED LRG 11.4X10 (STAPLE) ×2
APR CLP MED LRG 5 ANG JAW (MISCELLANEOUS) ×2
BAG SPEC RTRVL LRG 6X4 10 (ENDOMECHANICALS) ×2
BLADE SURG ROTATE 9660 (MISCELLANEOUS) ×2 IMPLANT
CANISTER SUCTION 2500CC (MISCELLANEOUS) ×3 IMPLANT
CHLORAPREP W/TINT 26ML (MISCELLANEOUS) ×3 IMPLANT
CLIP APPLIE 5 13 M/L LIGAMAX5 (MISCELLANEOUS) ×2 IMPLANT
CLIP APPLIE ROT 10 11.4 M/L (STAPLE) ×1 IMPLANT
CLOTH BEACON ORANGE TIMEOUT ST (SAFETY) ×3 IMPLANT
COVER MAYO STAND STRL (DRAPES) ×3 IMPLANT
COVER SURGICAL LIGHT HANDLE (MISCELLANEOUS) ×3 IMPLANT
DECANTER SPIKE VIAL GLASS SM (MISCELLANEOUS) ×2 IMPLANT
DERMABOND ADVANCED (GAUZE/BANDAGES/DRESSINGS) ×1
DERMABOND ADVANCED .7 DNX12 (GAUZE/BANDAGES/DRESSINGS) ×2 IMPLANT
DRAPE C-ARM 42X72 X-RAY (DRAPES) ×3 IMPLANT
DRAPE UTILITY 15X26 W/TAPE STR (DRAPE) ×6 IMPLANT
ELECT REM PT RETURN 9FT ADLT (ELECTROSURGICAL) ×3
ELECTRODE REM PT RTRN 9FT ADLT (ELECTROSURGICAL) ×2 IMPLANT
FILTER SMOKE EVAC LAPAROSHD (FILTER) ×2 IMPLANT
GLOVE BIO SURGEON STRL SZ8 (GLOVE) ×3 IMPLANT
GLOVE BIOGEL PI IND STRL 7.0 (GLOVE) ×1 IMPLANT
GLOVE BIOGEL PI IND STRL 7.5 (GLOVE) ×1 IMPLANT
GLOVE BIOGEL PI IND STRL 8 (GLOVE) ×3 IMPLANT
GLOVE BIOGEL PI INDICATOR 7.0 (GLOVE) ×1
GLOVE BIOGEL PI INDICATOR 7.5 (GLOVE) ×1
GLOVE BIOGEL PI INDICATOR 8 (GLOVE) ×2
GLOVE ECLIPSE 7.5 STRL STRAW (GLOVE) ×5 IMPLANT
GLOVE SURG SS PI 7.0 STRL IVOR (GLOVE) ×2 IMPLANT
GOWN PREVENTION PLUS XLARGE (GOWN DISPOSABLE) ×2 IMPLANT
GOWN STRL NON-REIN LRG LVL3 (GOWN DISPOSABLE) ×8 IMPLANT
HEMOSTAT SNOW SURGICEL 2X4 (HEMOSTASIS) ×2 IMPLANT
KIT BASIN OR (CUSTOM PROCEDURE TRAY) ×3 IMPLANT
KIT ROOM TURNOVER OR (KITS) ×3 IMPLANT
NS IRRIG 1000ML POUR BTL (IV SOLUTION) ×3 IMPLANT
PAD ARMBOARD 7.5X6 YLW CONV (MISCELLANEOUS) ×6 IMPLANT
POUCH SPECIMEN RETRIEVAL 10MM (ENDOMECHANICALS) ×2 IMPLANT
SCISSORS LAP 5X35 DISP (ENDOMECHANICALS) IMPLANT
SET CHOLANGIOGRAPH 5 50 .035 (SET/KITS/TRAYS/PACK) ×3 IMPLANT
SET IRRIG TUBING LAPAROSCOPIC (IRRIGATION / IRRIGATOR) ×3 IMPLANT
SLEEVE ENDOPATH XCEL 5M (ENDOMECHANICALS) ×6 IMPLANT
SPECIMEN JAR SMALL (MISCELLANEOUS) ×3 IMPLANT
SUT MNCRL AB 4-0 PS2 18 (SUTURE) ×3 IMPLANT
TOWEL OR 17X24 6PK STRL BLUE (TOWEL DISPOSABLE) ×3 IMPLANT
TOWEL OR 17X26 10 PK STRL BLUE (TOWEL DISPOSABLE) ×3 IMPLANT
TRAY LAPAROSCOPIC (CUSTOM PROCEDURE TRAY) ×3 IMPLANT
TROCAR XCEL BLUNT TIP 100MML (ENDOMECHANICALS) ×3 IMPLANT
TROCAR XCEL NON-BLD 11X100MML (ENDOMECHANICALS) ×2 IMPLANT
TROCAR XCEL NON-BLD 5MMX100MML (ENDOMECHANICALS) ×3 IMPLANT
WATER STERILE IRR 1000ML POUR (IV SOLUTION) IMPLANT

## 2012-12-01 NOTE — Progress Notes (Signed)
Patient sets self on cpap tolerating well at thsi time. sats 97% home machine

## 2012-12-01 NOTE — Anesthesia Preprocedure Evaluation (Addendum)
Anesthesia Evaluation  Patient identified by MRN, date of birth, ID band Patient awake    Reviewed: Allergy & Precautions, H&P , NPO status , Patient's Chart, lab work & pertinent test results  History of Anesthesia Complications Negative for: history of anesthetic complications  Airway Mallampati: II TM Distance: >3 FB Neck ROM: Full    Dental  (+) Caps and Dental Advisory Given   Pulmonary asthma , sleep apnea and Continuous Positive Airway Pressure Ventilation ,  CXR 11/30/12 Cardiomegaly.  Central vascular congestion.  Aortic atherosclerosis.  Mild lung base opacity on the left.  Interstitial prominence may be accentuated by overlying soft tissues.  No pleural effusion or pneumothorax.  No acute osseous finding.   IMPRESSION: Cardiomediastinal prominence, similar to prior.   Mild lung base opacity; atelectasis (favored) versus infiltrate.    Pulmonary exam normal       Cardiovascular +CHF (2006)  BLE swelling. Off lasix. 2006 admit with 60lb fluid gain. No problems since.   Neuro/Psych negative neurological ROS  negative psych ROS   GI/Hepatic Neg liver ROS, Acute GB dz +nausea    Endo/Other  Morbid obesity  Renal/GU negative Renal ROS     Musculoskeletal negative musculoskeletal ROS (+)   Abdominal (+) + obese,   Peds negative pediatric ROS (+)  Hematology negative hematology ROS (+)   Anesthesia Other Findings   Reproductive/Obstetrics negative OB ROS                      Anesthesia Physical Anesthesia Plan  ASA: III  Anesthesia Plan: General   Post-op Pain Management:    Induction: Intravenous and Rapid sequence  Airway Management Planned: Oral ETT  Additional Equipment:   Intra-op Plan:   Post-operative Plan: Extubation in OR  Informed Consent: I have reviewed the patients History and Physical, chart, labs and discussed the procedure including the risks, benefits and  alternatives for the proposed anesthesia with the patient or authorized representative who has indicated his/her understanding and acceptance.   Dental advisory given  Plan Discussed with: CRNA, Anesthesiologist and Surgeon  Anesthesia Plan Comments:        Anesthesia Quick Evaluation

## 2012-12-01 NOTE — Progress Notes (Signed)
For surgery today.  About 50% chance of needing to open.  Victor Medina. Gae Bon, MD, FACS [phone] [facility] Surgery

## 2012-12-01 NOTE — Preoperative (Signed)
Beta Blockers   Reason not to administer Beta Blockers:Not Applicable 

## 2012-12-01 NOTE — Op Note (Signed)
OPERATIVE REPORT  DATE OF OPERATION: 11/29/2012 - 12/01/2012  PATIENT:  Victor Medina  67 y.o. male  PRE-OPERATIVE DIAGNOSIS:  acute cholecystitis  POST-OPERATIVE DIAGNOSIS:  acute cholecystitis  PROCEDURE:  Procedure(s): LAPAROSCOPIC CHOLECYSTECTOMY  SURGEON:  Surgeon(s): Cherylynn Ridges, MD Liz Malady, MD  ASSISTANT: Janee Morn, M.D.  ANESTHESIA:   general  EBL: 50 ml  BLOOD ADMINISTERED: none  DRAINS: none   SPECIMEN:  Source of Specimen:  Gallbladder and stones  COUNTS CORRECT:  YES  PROCEDURE DETAILS: The patient was taken to the operating room and placed on the table in the supine position.  After an adequate endotracheal anesthetic was administered, (she/he) was prepped with (ChloroPrep/Betadine), and then draped in the usual manner exposing the entire abdomen laterally, inferiorly and up  to the costal margins.  After a proper timeout was performed including identifying the patient and the procedure to be performed, a supra-umbilical 1.5cm midline incision was made using a #15 blade.  This was taken down to the fascia which was then incised with a #15 blade.  The edges of the fascia were tented up with Kocher clamps as the preperitoneal space was penetrated with a Kelly clamp into the peritoneum.  Once this was done, a pursestring suture of 0 Vicryl was passed around the fascial opening.  This was subsequently used to secure the [facility] cannula which was passed into the peritoneal cavity.  Once the [facility] cannula was in place, carbon dioxide gas was insufflated into the peritoneal cavity up to a maximal intra-abdominal pressure of 15mm Hg.The laparoscope, with attached camera and light source, was passed into the peritoneal cavity to visualize the direct insertion of two right upper quadrant 5mm cannulas, and a sup-xiphoid 10mm cannula.  Once all cannulas were in place, the dissection was begun.  Two ratcheted graspers were attached to the dome and infundibulum of the  gallbladder and retracted towards the anterior abdominal wall and the right upper quadrant.  Using cautery attached to a dissecting forceps, the peritoneum overlaying the triangle of Chalot and the hepatoduodenal triangle was dissected away exposing the cystic duct and the cystic artery.  A clip was placed on the gallbladder side of the cystic duct, then the distal cystic duct was clipped multiple times then transected.  No cholangiogram was performed.  There was intense inflammation of the gallbladder, the wall was markedly thickened, and it was difficult to retract throughout the case.  A 30 degree 10mm scope was used and we placed and additional 5mm cannula for retraction. The gallbladder was then dissected out of the hepatic bed with significant difficulty, but safely and completely.  It was retrieved from the abdomen (using an EndoCatch bag).  Once the gallbladder was removed, the bed was inspected for hemostasis.  SurgiCel snow had to be used to help with hemostasis.  Electrocautery was also used.  Once excellent hemostasis was obtained all gas and fluids were aspirated from above the liver, then the cannulas were removed.  The supra-umbilical incision was closed using the pursestring suture which was in place.  0.25% bupivicaine with epinephrine was injected at all sites.  All 10mm or greater cannula sites were close using a running subcuticular stitch of 4-0 Monocryl.  5.32mm cannula sites were closed with Dermabond only.Steri-Strips and Tagaderm were used to complete the dressings at all sites.  At this point all needle, sponge, and instrument counts were correct.The patient was awakened from anesthesia and taken to the PACU in stable condition.  PATIENT DISPOSITION:  PACU - hemodynamically stable.   Cherylynn Ridges 4/25/201412:37 PM

## 2012-12-01 NOTE — Transfer of Care (Signed)
Immediate Anesthesia Transfer of Care Note  Patient: Victor Medina  Procedure(s) Performed: Procedure(s): LAPAROSCOPIC CHOLECYSTECTOMY (N/A)  Patient Location: PACU  Anesthesia Type:General  Level of Consciousness: awake, alert  and patient cooperative  Airway & Oxygen Therapy: Patient Spontanous Breathing and Patient connected to face mask oxygen  Post-op Assessment: Report given to PACU RN and Post -op Vital signs reviewed and stable  Post vital signs: Reviewed and stable  Complications: No apparent anesthesia complications

## 2012-12-01 NOTE — Anesthesia Procedure Notes (Signed)
Procedure Name: Intubation Date/Time: 12/01/2012 10:19 AM Performed by: Leona Singleton A Pre-anesthesia Checklist: Patient identified Patient Re-evaluated:Patient Re-evaluated prior to inductionOxygen Delivery Method: Circle system utilized Preoxygenation: Pre-oxygenation with 100% oxygen Intubation Type: IV induction Ventilation: Mask ventilation without difficulty and Oral airway inserted - appropriate to patient size Laryngoscope Size: Hyacinth Meeker and 2 Grade View: Grade II Tube type: Oral Tube size: 7.5 mm Number of attempts: 1 Airway Equipment and Method: Stylet,  LTA kit utilized and Patient positioned with wedge pillow Placement Confirmation: ETT inserted through vocal cords under direct vision,  positive ETCO2 and breath sounds checked- equal and bilateral Secured at: 23 cm Tube secured with: Tape Dental Injury: Teeth and Oropharynx as per pre-operative assessment

## 2012-12-02 DIAGNOSIS — K810 Acute cholecystitis: Secondary | ICD-10-CM

## 2012-12-02 LAB — COMPREHENSIVE METABOLIC PANEL
Albumin: 2.1 g/dL — ABNORMAL LOW (ref 3.5–5.2)
Alkaline Phosphatase: 104 U/L (ref 39–117)
BUN: 25 mg/dL — ABNORMAL HIGH (ref 6–23)
Chloride: 102 mEq/L (ref 96–112)
Glucose, Bld: 123 mg/dL — ABNORMAL HIGH (ref 70–99)
Potassium: 3.4 mEq/L — ABNORMAL LOW (ref 3.5–5.1)
Total Bilirubin: 0.9 mg/dL (ref 0.3–1.2)

## 2012-12-02 LAB — CBC WITH DIFFERENTIAL/PLATELET
Basophils Relative: 0 % (ref 0–1)
Hemoglobin: 12.6 g/dL — ABNORMAL LOW (ref 13.0–17.0)
Lymphs Abs: 0.9 10*3/uL (ref 0.7–4.0)
Monocytes Relative: 10 % (ref 3–12)
Neutro Abs: 7.7 10*3/uL (ref 1.7–7.7)
Neutrophils Relative %: 80 % — ABNORMAL HIGH (ref 43–77)
RBC: 4.4 MIL/uL (ref 4.22–5.81)
WBC: 9.7 10*3/uL (ref 4.0–10.5)

## 2012-12-02 MED ORDER — AMOXICILLIN-POT CLAVULANATE 875-125 MG PO TABS: 1.0000 | ORAL_TABLET | Freq: Two times a day (BID) | ORAL | Status: DC

## 2012-12-02 MED ORDER — HYDROCODONE-ACETAMINOPHEN 5-325 MG PO TABS: 1.0000 | ORAL_TABLET | ORAL | Status: DC | PRN

## 2012-12-02 NOTE — Progress Notes (Signed)
1 Day Post-Op  Subjective: He c/o incisional soreness.  Tolerating liquid diet.  Objective: Vital signs in last 24 hours: Temp:  [97 F (36.1 C)-100 F (37.8 C)] 98.6 F (37 C) (04/26 0936) Pulse Rate:  [89-106] 101 (04/26 0936) Resp:  [14-22] 20 (04/26 0936) BP: (120-187)/(73-87) 124/79 mmHg (04/26 0936) SpO2:  [93 %-99 %] 93 % (04/26 0936) Last BM Date: 11/26/12  Intake/Output from previous day: 04/25 0701 - 04/26 0700 In: 2950 [I.V.:2950] Out: 400 [Urine:200; Blood:200] Intake/Output this shift: Total I/O In: 240 [P.O.:240] Out: -   PE: General- In NAD Abdomen-soft,obese, incisions clean and intact  Lab Results:   Recent Labs  11/30/12 0535 12/02/12 0730  WBC 20.7* 9.7  HGB 14.9 12.6*  HCT 43.0 36.1*  PLT 209 207   BMET  Recent Labs  11/30/12 0535 12/02/12 0730  NA 137 135  K 3.6 3.4*  CL 102 102  CO2 25 25  GLUCOSE 125* 123*  BUN 26* 25*  CREATININE 1.03 0.94  CALCIUM 8.6 8.6   PT/INR No results found for this basename: LABPROT, INR,  in the last 72 hours Comprehensive Metabolic Panel:    Component Value Date/Time   NA 135 12/02/2012 0730   K 3.4* 12/02/2012 0730   CL 102 12/02/2012 0730   CO2 25 12/02/2012 0730   BUN 25* 12/02/2012 0730   CREATININE 0.94 12/02/2012 0730   GLUCOSE 123* 12/02/2012 0730   CALCIUM 8.6 12/02/2012 0730   AST 90* 12/02/2012 0730   ALT 115* 12/02/2012 0730   ALKPHOS 104 12/02/2012 0730   BILITOT 0.9 12/02/2012 0730   PROT 5.6* 12/02/2012 0730   ALBUMIN 2.1* 12/02/2012 0730     Studies/Results: No results found.  Anti-infectives: Anti-infectives   Start     Dose/Rate Route Frequency Ordered Stop   11/30/12 0600  piperacillin-tazobactam (ZOSYN) IVPB 3.375 g     3.375 g 12.5 mL/hr over 240 Minutes Intravenous 3 times per day 11/29/12 2334     11/29/12 2345  piperacillin-tazobactam (ZOSYN) IVPB 3.375 g     3.375 g 100 mL/hr over 30 Minutes Intravenous  Once 11/29/12 2333     11/29/12 2300  piperacillin-tazobactam  (ZOSYN) IVPB 3.375 g     3.375 g 100 mL/hr over 30 Minutes Intravenous  Once 11/29/12 2259 11/29/12 2329      Assessment Active Problems:   Cholecystitis, acute s/p laparoscopic cholecystectomy 12/01/12-stable overnight   OSA-using CPAP    LOS: 3 days   Plan: If he tolerates lunch okay, he can be discharged later today.  Discharge instructions were discussed with him.   Arrielle Mcginn J 12/02/2012

## 2012-12-02 NOTE — Progress Notes (Signed)
Discharge instructions gone over with patient. Home medications gone over. Follow up appointment to be made. Prescription given. Signs and symptoms of infection and when to call the doctor gone over. Diet, activity, and incisional care gone over. My chart discussed. Patient verbalized understanding of instructions.

## 2012-12-04 ENCOUNTER — Encounter (HOSPITAL_COMMUNITY): Payer: Self-pay | Admitting: General Surgery

## 2012-12-04 NOTE — ED Provider Notes (Signed)
I saw and evaluated the patient, reviewed the resident's note and I agree with the findings and plan.  Patient seen by me. Patient with four-day history of right upper quadrant abdominal pain. Workup an ultrasound consistent with acute cholecystitis. Neurosurgery contacted for admission they concur they will admit. EKG reviewed by me concur with the resident's interpretation of EKG.  Results for orders placed during the hospital encounter of 11/29/12  CULTURE, BLOOD (ROUTINE X 2)      Result Value Range   Specimen Description BLOOD LEFT ARM     Special Requests BOTTLES DRAWN AEROBIC ONLY 10CC     Culture  Setup Time 11/30/2012 04:21     Culture       Value:        BLOOD CULTURE RECEIVED NO GROWTH TO DATE CULTURE WILL BE HELD FOR 5 DAYS BEFORE ISSUING A FINAL NEGATIVE REPORT   Report Status PENDING    SURGICAL PCR SCREEN      Result Value Range   MRSA, PCR NEGATIVE  NEGATIVE   Staphylococcus aureus POSITIVE (*) NEGATIVE  CULTURE, BLOOD (ROUTINE X 2)      Result Value Range   Specimen Description BLOOD RIGHT ARM     Special Requests BOTTLES DRAWN AEROBIC ONLY 10CC     Culture  Setup Time 11/30/2012 04:21     Culture       Value:        BLOOD CULTURE RECEIVED NO GROWTH TO DATE CULTURE WILL BE HELD FOR 5 DAYS BEFORE ISSUING A FINAL NEGATIVE REPORT   Report Status PENDING    COMPREHENSIVE METABOLIC PANEL      Result Value Range   Sodium 132 (*) 135 - 145 mEq/L   Potassium 3.8  3.5 - 5.1 mEq/L   Chloride 96  96 - 112 mEq/L   CO2 22  19 - 32 mEq/L   Glucose, Bld 140 (*) 70 - 99 mg/dL   BUN 24 (*) 6 - 23 mg/dL   Creatinine, Ser 4.54  0.50 - 1.35 mg/dL   Calcium 9.1  8.4 - 09.8 mg/dL   Total Protein 6.9  6.0 - 8.3 g/dL   Albumin 3.3 (*) 3.5 - 5.2 g/dL   AST 20  0 - 37 U/L   ALT 21  0 - 53 U/L   Alkaline Phosphatase 64  39 - 117 U/L   Total Bilirubin 1.6 (*) 0.3 - 1.2 mg/dL   GFR calc non Af Amer 84 (*) >90 mL/min   GFR calc Af Amer >90  >90 mL/min  LIPASE, BLOOD      Result Value  Range   Lipase 11  11 - 59 U/L  CBC WITH DIFFERENTIAL      Result Value Range   WBC 20.1 (*) 4.0 - 10.5 K/uL   RBC 5.38  4.22 - 5.81 MIL/uL   Hemoglobin 15.1  13.0 - 17.0 g/dL   HCT 11.9  14.7 - 82.9 %   MCV 81.2  78.0 - 100.0 fL   MCH 28.1  26.0 - 34.0 pg   MCHC 34.6  30.0 - 36.0 g/dL   RDW 56.2  13.0 - 86.5 %   Platelets 208  150 - 400 K/uL   Neutrophils Relative 86 (*) 43 - 77 %   Lymphocytes Relative 4 (*) 12 - 46 %   Monocytes Relative 10  3 - 12 %   Eosinophils Relative 0  0 - 5 %   Basophils Relative 0  0 - 1 %  Smear Review MORPHOLOGY UNREMARKABLE     Neutro Abs 17.3 (*) 1.7 - 7.7 K/uL   Lymphs Abs 0.8  0.7 - 4.0 K/uL   Monocytes Absolute 2.0 (*) 0.1 - 1.0 K/uL   Eosinophils Absolute 0.0  0.0 - 0.7 K/uL   Basophils Absolute 0.0  0.0 - 0.1 K/uL  COMPREHENSIVE METABOLIC PANEL      Result Value Range   Sodium 137  135 - 145 mEq/L   Potassium 3.6  3.5 - 5.1 mEq/L   Chloride 102  96 - 112 mEq/L   CO2 25  19 - 32 mEq/L   Glucose, Bld 125 (*) 70 - 99 mg/dL   BUN 26 (*) 6 - 23 mg/dL   Creatinine, Ser 4.09  0.50 - 1.35 mg/dL   Calcium 8.6  8.4 - 81.1 mg/dL   Total Protein 6.3  6.0 - 8.3 g/dL   Albumin 3.0 (*) 3.5 - 5.2 g/dL   AST 75 (*) 0 - 37 U/L   ALT 73 (*) 0 - 53 U/L   Alkaline Phosphatase 97  39 - 117 U/L   Total Bilirubin 1.9 (*) 0.3 - 1.2 mg/dL   GFR calc non Af Amer 73 (*) >90 mL/min   GFR calc Af Amer 85 (*) >90 mL/min  CBC      Result Value Range   WBC 20.7 (*) 4.0 - 10.5 K/uL   RBC 5.21  4.22 - 5.81 MIL/uL   Hemoglobin 14.9  13.0 - 17.0 g/dL   HCT 91.4  78.2 - 95.6 %   MCV 82.5  78.0 - 100.0 fL   MCH 28.6  26.0 - 34.0 pg   MCHC 34.7  30.0 - 36.0 g/dL   RDW 21.3  08.6 - 57.8 %   Platelets 209  150 - 400 K/uL  CBC WITH DIFFERENTIAL      Result Value Range   WBC 9.7  4.0 - 10.5 K/uL   RBC 4.40  4.22 - 5.81 MIL/uL   Hemoglobin 12.6 (*) 13.0 - 17.0 g/dL   HCT 46.9 (*) 62.9 - 52.8 %   MCV 82.0  78.0 - 100.0 fL   MCH 28.6  26.0 - 34.0 pg   MCHC 34.9   30.0 - 36.0 g/dL   RDW 41.3  24.4 - 01.0 %   Platelets 207  150 - 400 K/uL   Neutrophils Relative 80 (*) 43 - 77 %   Neutro Abs 7.7  1.7 - 7.7 K/uL   Lymphocytes Relative 9 (*) 12 - 46 %   Lymphs Abs 0.9  0.7 - 4.0 K/uL   Monocytes Relative 10  3 - 12 %   Monocytes Absolute 1.0  0.1 - 1.0 K/uL   Eosinophils Relative 1  0 - 5 %   Eosinophils Absolute 0.1  0.0 - 0.7 K/uL   Basophils Relative 0  0 - 1 %   Basophils Absolute 0.0  0.0 - 0.1 K/uL  COMPREHENSIVE METABOLIC PANEL      Result Value Range   Sodium 135  135 - 145 mEq/L   Potassium 3.4 (*) 3.5 - 5.1 mEq/L   Chloride 102  96 - 112 mEq/L   CO2 25  19 - 32 mEq/L   Glucose, Bld 123 (*) 70 - 99 mg/dL   BUN 25 (*) 6 - 23 mg/dL   Creatinine, Ser 2.72  0.50 - 1.35 mg/dL   Calcium 8.6  8.4 - 53.6 mg/dL   Total Protein 5.6 (*) 6.0 - 8.3  g/dL   Albumin 2.1 (*) 3.5 - 5.2 g/dL   AST 90 (*) 0 - 37 U/L   ALT 115 (*) 0 - 53 U/L   Alkaline Phosphatase 104  39 - 117 U/L   Total Bilirubin 0.9  0.3 - 1.2 mg/dL   GFR calc non Af Amer 85 (*) >90 mL/min   GFR calc Af Amer >90  >90 mL/min   Patient with marked elevation of the white blood cell count. Bilirubin is normal. As stated above symptoms consistent with acute cholecystitis antibodies will be initiated as per general surgery's recommendation. Patient still with marked tenderness over the right upper quadrant is tenderness to palpation.  Shelda Jakes, MD 12/04/12 1055

## 2012-12-04 NOTE — Discharge Summary (Signed)
Patient ID: Victor Medina MRN: [id number] DOB/AGE: 1946-04-10 67 y.o.  Admit date: 11/29/2012 Discharge date: 12/02/2012  Procedures: lap chole   Consults: none  Reason for Admission:67 yom who presents with first episode of ruq pain. He ate dinner on Easter and then had n/v overnight. Since then he has had ruq pain that has been unrelenting and worsening. He has not had more n/v. Has had low grade fevers. There have been no relieving factors. Aggravated by palpation. He has not really eaten anything since Sunday either. He denies any prior symptoms.     Admission Diagnoses:  1. Acute cholecystitis 2. asthma  Hospital Course: The patient was admitted and placed on IV abx therapy.  He was given 24 hrs to cool down.  The following day he was taken to the operating room where he underwent a lap chole.  The patient tolerated this procedure well.  The following day the patient was tolerating a regular diet and oral pain medications.  He was stable for dc home.  Discharge Diagnoses:  Active Problems:   Cholecystitis, acute s/p laparoscopic cholecystectomy 12/01/12   Discharge Medications:   Medication List    TAKE these medications       acetaminophen 500 MG tablet  Commonly known as:  TYLENOL  Take 1,000 mg by mouth every 6 (six) hours as needed for pain.     amoxicillin-clavulanate 875-125 MG per tablet  Commonly known as:  AUGMENTIN  Take 1 tablet by mouth 2 (two) times daily.     aspirin EC 81 MG tablet  Take 81 mg by mouth daily.     cholecalciferol 1000 UNITS tablet  Commonly known as:  VITAMIN D  Take 2,000 Units by mouth daily.     fish oil-omega-3 fatty acids 1000 MG capsule  Take 2 g by mouth daily.     Fluticasone-Salmeterol 250-50 MCG/DOSE Aepb  Commonly known as:  ADVAIR  Inhale 1 puff into the lungs every 12 (twelve) hours.     HYDROcodone-acetaminophen 5-325 MG per tablet  Commonly known as:  NORCO/VICODIN  Take 1-2 tablets by mouth every 4 (four) hours  as needed.     multivitamin with minerals Tabs  Take 1 tablet by mouth daily.        Discharge Instructions: Follow up in 2-3 weeks for appointment in our office.  Signed: Letha Cape 12/04/2012, 7:46 PM

## 2012-12-05 ENCOUNTER — Other Ambulatory Visit (INDEPENDENT_AMBULATORY_CARE_PROVIDER_SITE_OTHER): Payer: Self-pay | Admitting: General Surgery

## 2012-12-05 NOTE — Discharge Summary (Signed)
Agree with summary. 

## 2012-12-06 ENCOUNTER — Telehealth (INDEPENDENT_AMBULATORY_CARE_PROVIDER_SITE_OTHER): Payer: Self-pay | Admitting: *Deleted

## 2012-12-06 LAB — CULTURE, BLOOD (ROUTINE X 2): Culture: NO GROWTH

## 2012-12-06 NOTE — Telephone Encounter (Signed)
Patient called to report that he has not had a bowel movement since his surgery and is having abdominal pain.  Patient encouraged to take Milk of Mag.  Instructed patient he can take up to 4 tablespoons every 6 hours until a bowel movement is produced.  Also called in the prescription of Norco from yesterday since [facility] states they never received it.  Patient states understanding of all and agreeable at this time.

## 2012-12-07 NOTE — Anesthesia Postprocedure Evaluation (Signed)
  Anesthesia Post-op Note  Patient: Victor Medina  Procedure(s) Performed: Procedure(s): LAPAROSCOPIC CHOLECYSTECTOMY (N/A)  Patient Location: PACU  Anesthesia Type:General  Level of Consciousness: awake and alert   Airway and Oxygen Therapy: Patient Spontanous Breathing and Patient connected to nasal cannula oxygen  Post-op Pain: mild  Post-op Assessment: Post-op Vital signs reviewed, Patient's Cardiovascular Status Stable, Respiratory Function Stable and Patent Airway  Post-op Vital Signs: Reviewed and stable  Complications: No apparent anesthesia complications

## 2013-02-02 ENCOUNTER — Other Ambulatory Visit: Payer: Self-pay | Admitting: Orthopedic Surgery

## 2013-02-02 DIAGNOSIS — M25562 Pain in left knee: Secondary | ICD-10-CM

## 2013-02-05 ENCOUNTER — Ambulatory Visit (HOSPITAL_COMMUNITY)
Admission: RE | Admit: 2013-02-05 | Discharge: 2013-02-05 | Disposition: A | Discharge status: Home / Self Care | Payer: Medicare Other | Source: Ambulatory Visit | Attending: Orthopedic Surgery | Admitting: Orthopedic Surgery

## 2013-02-05 DIAGNOSIS — M25469 Effusion, unspecified knee: Secondary | ICD-10-CM | POA: Insufficient documentation

## 2013-02-05 DIAGNOSIS — M25562 Pain in left knee: Secondary | ICD-10-CM

## 2013-02-05 DIAGNOSIS — M7120 Synovial cyst of popliteal space [Baker], unspecified knee: Secondary | ICD-10-CM | POA: Insufficient documentation

## 2013-02-05 DIAGNOSIS — M2240 Chondromalacia patellae, unspecified knee: Secondary | ICD-10-CM | POA: Insufficient documentation

## 2013-02-05 DIAGNOSIS — X58XXXA Exposure to other specified factors, initial encounter: Secondary | ICD-10-CM | POA: Insufficient documentation

## 2013-02-05 DIAGNOSIS — IMO0002 Reserved for concepts with insufficient information to code with codable children: Secondary | ICD-10-CM | POA: Insufficient documentation

## 2013-02-10 ENCOUNTER — Other Ambulatory Visit: Payer: Medicare Other

## 2013-05-04 ENCOUNTER — Ambulatory Visit (HOSPITAL_COMMUNITY)
Admission: RE | Admit: 2013-05-04 | Discharge: 2013-05-04 | Disposition: A | Discharge status: Home / Self Care | Payer: Medicare Other | Source: Ambulatory Visit | Attending: Family Medicine | Admitting: Family Medicine

## 2013-05-04 ENCOUNTER — Other Ambulatory Visit (HOSPITAL_COMMUNITY): Payer: Self-pay | Admitting: Family Medicine

## 2013-05-04 DIAGNOSIS — M7989 Other specified soft tissue disorders: Secondary | ICD-10-CM

## 2013-05-04 DIAGNOSIS — M25562 Pain in left knee: Secondary | ICD-10-CM

## 2013-05-04 DIAGNOSIS — M25569 Pain in unspecified knee: Secondary | ICD-10-CM | POA: Insufficient documentation

## 2013-05-04 NOTE — Progress Notes (Signed)
Left lower extremity venous duplex completed.  Left:  No evidence of DVT or superficial thrombosis.  There appears to be a Baker's cyst.  Right:  Negative for DVT in the common femoral vein.  

## 2013-08-16 ENCOUNTER — Encounter: Payer: Self-pay | Admitting: General Surgery

## 2013-08-16 DIAGNOSIS — M67919 Unspecified disorder of synovium and tendon, unspecified shoulder: Secondary | ICD-10-CM

## 2013-08-16 DIAGNOSIS — G4733 Obstructive sleep apnea (adult) (pediatric): Secondary | ICD-10-CM | POA: Insufficient documentation

## 2013-09-06 ENCOUNTER — Ambulatory Visit: Payer: Medicare Other | Admitting: Cardiology

## 2013-10-02 ENCOUNTER — Ambulatory Visit: Payer: Medicare Other | Admitting: Cardiology

## 2013-10-26 ENCOUNTER — Ambulatory Visit (INDEPENDENT_AMBULATORY_CARE_PROVIDER_SITE_OTHER): Payer: Medicare Other | Admitting: Cardiology

## 2013-10-26 ENCOUNTER — Encounter: Payer: Self-pay | Admitting: Cardiology

## 2013-10-26 VITALS — BP 140/100 | HR 91 | Ht 73.0 in | Wt 353.0 lb

## 2013-10-26 DIAGNOSIS — I4510 Unspecified right bundle-branch block: Secondary | ICD-10-CM | POA: Insufficient documentation

## 2013-10-26 DIAGNOSIS — R609 Edema, unspecified: Secondary | ICD-10-CM

## 2013-10-26 DIAGNOSIS — I77819 Aortic ectasia, unspecified site: Secondary | ICD-10-CM | POA: Insufficient documentation

## 2013-10-26 DIAGNOSIS — I509 Heart failure, unspecified: Secondary | ICD-10-CM

## 2013-10-26 DIAGNOSIS — I5032 Chronic diastolic (congestive) heart failure: Secondary | ICD-10-CM

## 2013-10-26 DIAGNOSIS — I77810 Thoracic aortic ectasia: Secondary | ICD-10-CM

## 2013-10-26 DIAGNOSIS — I10 Essential (primary) hypertension: Secondary | ICD-10-CM

## 2013-10-26 DIAGNOSIS — R600 Localized edema: Secondary | ICD-10-CM

## 2013-10-26 DIAGNOSIS — G4733 Obstructive sleep apnea (adult) (pediatric): Secondary | ICD-10-CM

## 2013-10-26 MED ORDER — HYDROCHLOROTHIAZIDE 25 MG PO TABS: 25.0000 mg | ORAL_TABLET | Freq: Every day | ORAL | Status: DC

## 2013-10-26 NOTE — Patient Instructions (Addendum)
Your physician has recommended you make the following change in your medication: 1. Start HCTZ 25 MG 1 Tablet Daily  Your physician has requested that you have an echocardiogram. Echocardiography is a painless test that uses sound waves to create images of your heart. It provides your doctor with information about the size and shape of your heart and how well your heart's chambers and valves are working. This procedure takes approximately one hour. There are no restrictions for this procedure. (Schedule same Day as Nurse visit and BMET)  Your physician recommends that you schedule a follow-up appointment in: 1-2 Week Nurse Visit for BP Check  And BMET in lab (same day as echo)  Your physician wants you to follow-up in: 6 months with Dr Mallie Snooks will receive a reminder letter in the mail two months in advance. If you don't receive a letter, please call our office to schedule the follow-up appointment.

## 2013-10-26 NOTE — Progress Notes (Signed)
Faxed to patient's PCP  [place], [address] [place], [place]  [address] Phone: [phone] Fax:  [phone]  Date:  10/26/2013   ID:  Victor Medina, DOB 1946-06-21, MRN [id number]  PCP:  Gavin Pound, MD  Sleep Medicine:  Fransico Him, MD   History of Present Illness: Victor Medina is a 68 y.o. male with a history of OSA and morbid obesity who presents today for followup.Since I saw him last he has had knee and shoulder surgery.  He is doing well.  He tolerates his CPAP without problems.  He tolerates his full face mask and feels the pressure is adequate.  He feels rested in the am if he sleeps ok at night and does not have to get up much to go to the bathroom and has no daytime sleepiness.  He denies any chest pain, SOB, DOE, dizziness, palpitations or syncope.  He has chronic LE edema that he notices at the end of the day.   Wt Readings from Last 3 Encounters:  10/26/13 353 lb (160.12 kg)  11/30/12 343 lb 7.6 oz (155.8 kg)  11/30/12 343 lb 7.6 oz (155.8 kg)     Past Medical History  Diagnosis Date  . Asthma   . OSA (obstructive sleep apnea)   . Morbid obesity   . Chronic diastolic CHF (congestive heart failure)   . RBBB     Current Outpatient Prescriptions  Medication Sig Dispense Refill  . albuterol (PROVENTIL HFA;VENTOLIN HFA) 108 (90 BASE) MCG/ACT inhaler Inhale 2 puffs into the lungs every 4 (four) hours as needed for wheezing or shortness of breath.      Marland Kitchen aspirin EC 81 MG tablet Take 81 mg by mouth daily.      . Cholecalciferol (VITAMIN D3) 2000 UNITS TABS Take 2,000 Units by mouth daily.      . fish oil-omega-3 fatty acids 1000 MG capsule Take 2 g by mouth daily.      . fluticasone (VERAMYST) 27.5 MCG/SPRAY nasal spray Place 1 spray into the nose daily.      . Multiple Vitamin (MULTIVITAMIN WITH MINERALS) TABS Take 1 tablet by mouth daily.      . Probiotic Product (PROBIOTIC DAILY PO) Take by mouth daily.       No current facility-administered  medications for this visit.    Allergies:   No Known Allergies  Social History:  The patient  reports that he has never smoked. He does not have any smokeless tobacco history on file. He reports that he drinks alcohol. He reports that he does not use illicit drugs.   Family History:  The patient's family history includes Hypotension in his mother.   ROS:  Please see the history of present illness.      All other systems reviewed and negative.   PHYSICAL EXAM: VS:  BP 140/100  Pulse 91  Ht 6\' 1"  (1.854 m)  Wt 353 lb (160.12 kg)  BMI 46.58 kg/m2 Well nourished, well developed, in no acute distress HEENT: normal Neck: no JVD Cardiac:  normal S1, S2; RRR; no murmur Lungs:  clear to auscultation bilaterally, no wheezing, rhonchi or rales Abd: soft, nontender, no hepatomegaly Ext: 1+ edema Skin: warm and dry Neuro:  CNs 2-12 intact, no focal abnormalities noted  EKG:  NSR with RBBB     ASSESSMENT AND PLAN:  1. OSA on CPAP and tolerating well - I will get a d/l from his DME 2. Morbid Obesity - I have stressed  the importance of a combination of diet as well as aerobic exercise. 3. Chronic diastolic CHF - well compensated 4. Mildly dilated aortic root - recheck 2D echo to assess aortic root 5.  Chronic RBBB 6.  HTN with elevated BP today - I have recommended that we start HCTZ 25mg  daily which for BP as well as LE edema.    Nurse check in 1 week for BP and BMET  Followup with me in 6 months.  Signed, Fransico Him, MD 10/26/2013 3:50 PM

## 2013-11-09 ENCOUNTER — Encounter: Payer: Self-pay | Admitting: Cardiovascular Disease

## 2013-11-09 ENCOUNTER — Ambulatory Visit (HOSPITAL_COMMUNITY): Payer: Medicare Other | Attending: Cardiovascular Disease | Admitting: Radiology

## 2013-11-09 ENCOUNTER — Ambulatory Visit (INDEPENDENT_AMBULATORY_CARE_PROVIDER_SITE_OTHER): Payer: Medicare Other | Admitting: *Deleted

## 2013-11-09 ENCOUNTER — Other Ambulatory Visit: Payer: Self-pay

## 2013-11-09 VITALS — BP 132/70 | HR 91

## 2013-11-09 DIAGNOSIS — I77819 Aortic ectasia, unspecified site: Secondary | ICD-10-CM | POA: Insufficient documentation

## 2013-11-09 DIAGNOSIS — I77810 Thoracic aortic ectasia: Secondary | ICD-10-CM

## 2013-11-09 DIAGNOSIS — I10 Essential (primary) hypertension: Secondary | ICD-10-CM

## 2013-11-09 DIAGNOSIS — I5032 Chronic diastolic (congestive) heart failure: Secondary | ICD-10-CM

## 2013-11-09 LAB — BASIC METABOLIC PANEL
BUN: 26 mg/dL — AB (ref 6–23)
CHLORIDE: 102 meq/L (ref 96–112)
CO2: 29 meq/L (ref 19–32)
Calcium: 9.3 mg/dL (ref 8.4–10.5)
Creat: 1.1 mg/dL (ref 0.50–1.35)
Glucose, Bld: 123 mg/dL — ABNORMAL HIGH (ref 70–99)
POTASSIUM: 3.8 meq/L (ref 3.5–5.3)
Sodium: 140 mEq/L (ref 135–145)

## 2013-11-09 NOTE — Patient Instructions (Signed)
NO CHANGE IN MEDICATIONS AT THIS TIME

## 2013-11-09 NOTE — Progress Notes (Signed)
1.) Reason for visit: bp check  2.) Name of MD requesting visit: TURNER  H&P: PT WITH EL;EVATED BP AT LAST OFFICE VISIT. HCTZ WAS STARTED AND THE PT RETURNED FOR F/U BP.  3.) ROS related to problem: NO COMPLAINTS   4.) Assessment and plan per MD: NO CHANGE IN CURRENT THERAPY. WILL FORWARD FOR DR TURNER REVIEW  5.) Provider sign-of(MD statement):

## 2013-11-09 NOTE — Progress Notes (Signed)
Echocardiogram performed.  

## 2013-11-13 ENCOUNTER — Telehealth: Payer: Self-pay | Admitting: Cardiology

## 2013-11-13 ENCOUNTER — Other Ambulatory Visit: Payer: Self-pay | Admitting: Cardiology

## 2013-11-13 DIAGNOSIS — I77810 Thoracic aortic ectasia: Secondary | ICD-10-CM

## 2013-11-13 NOTE — Telephone Encounter (Signed)
Returned pt's call.

## 2013-11-13 NOTE — Telephone Encounter (Signed)
New Message:  Pt is requesting a call back from Amy w/ his test results

## 2014-04-24 ENCOUNTER — Encounter: Payer: Self-pay | Admitting: Cardiology

## 2014-04-25 ENCOUNTER — Encounter: Payer: Self-pay | Admitting: Cardiology

## 2014-04-25 ENCOUNTER — Ambulatory Visit (INDEPENDENT_AMBULATORY_CARE_PROVIDER_SITE_OTHER): Payer: Medicare Other | Admitting: Cardiology

## 2014-04-25 VITALS — BP 132/70 | HR 88 | Ht 73.0 in | Wt 344.8 lb

## 2014-04-25 DIAGNOSIS — I5032 Chronic diastolic (congestive) heart failure: Secondary | ICD-10-CM

## 2014-04-25 DIAGNOSIS — R600 Localized edema: Secondary | ICD-10-CM

## 2014-04-25 DIAGNOSIS — I509 Heart failure, unspecified: Secondary | ICD-10-CM

## 2014-04-25 DIAGNOSIS — G4733 Obstructive sleep apnea (adult) (pediatric): Secondary | ICD-10-CM

## 2014-04-25 DIAGNOSIS — R609 Edema, unspecified: Secondary | ICD-10-CM

## 2014-04-25 DIAGNOSIS — I4510 Unspecified right bundle-branch block: Secondary | ICD-10-CM

## 2014-04-25 DIAGNOSIS — I10 Essential (primary) hypertension: Secondary | ICD-10-CM

## 2014-04-25 DIAGNOSIS — I77810 Thoracic aortic ectasia: Secondary | ICD-10-CM

## 2014-04-25 NOTE — Progress Notes (Signed)
[name], [name], [name]  [address] Phone: [phone] Fax:  [phone]  Date:  04/25/2014   ID:  Victor Medina, DOB 12/16/1945, MRN [id number]  PCP:  Gavin Pound, MD  Sleep medicine:  Fransico Him, MD     History of Present Illness: Victor Medina is a 68 y.o. male with a history of OSA and morbid obesity, chronic diastolic CHF, chronic RBBB and mildly dilated aortic root who presents today for followup. He is doing well. He tolerates his CPAP without problems. He tolerates his full face mask and feels the pressure is adequate. He does not feel rested in the am because he has to get up to go to the bathroom and has no daytime sleepiness. He does not think that he snores.  He denies any chest pain, SOB, DOE, dizziness, palpitations or syncope.  He has chronic LE edema controlled on diuretics.  Wt Readings from Last 3 Encounters:  04/25/14 344 lb 12.8 oz (156.4 kg)  10/26/13 353 lb (160.12 kg)  11/30/12 343 lb 7.6 oz (155.8 kg)     Past Medical History  Diagnosis Date  . Asthma   . OSA (obstructive sleep apnea)   . Morbid obesity   . Chronic diastolic CHF (congestive heart failure)   . RBBB     Current Outpatient Prescriptions  Medication Sig Dispense Refill  . albuterol (PROVENTIL HFA;VENTOLIN HFA) 108 (90 BASE) MCG/ACT inhaler Inhale 2 puffs into the lungs every 4 (four) hours as needed for wheezing or shortness of breath.      Marland Kitchen aspirin EC 81 MG tablet Take 81 mg by mouth daily.      . beclomethasone (QVAR) 80 MCG/ACT inhaler Inhale 1 puff into the lungs 2 (two) times daily.      . Cholecalciferol (VITAMIN D3) 2000 UNITS TABS Take 1,000 Units by mouth daily.       . fish oil-omega-3 fatty acids 1000 MG capsule Take 2 g by mouth daily.      . fluticasone (FLONASE) 50 MCG/ACT nasal spray Place 1 spray into both nostrils daily.      . hydrochlorothiazide (HYDRODIURIL) 25 MG tablet Take 1 tablet (25 mg total) by mouth daily.  30 tablet  11  . Multiple  Vitamin (MULTIVITAMIN WITH MINERALS) TABS Take 1 tablet by mouth daily.      . Probiotic Product (PROBIOTIC DAILY PO) Take 1 tablet by mouth 3 (three) times daily before meals.       . simvastatin (ZOCOR) 20 MG tablet Take 20 mg by mouth daily.       No current facility-administered medications for this visit.    Allergies:   No Known Allergies  Social History:  The patient  reports that he has never smoked. He does not have any smokeless tobacco history on file. He reports that he drinks alcohol. He reports that he does not use illicit drugs.   Family History:  The patient's family history includes Hypotension in his mother.   ROS:  Please see the history of present illness.      All other systems reviewed and negative.   PHYSICAL EXAM: VS:  BP 132/70  Pulse 88  Ht 6\' 1"  (1.854 m)  Wt 344 lb 12.8 oz (156.4 kg)  BMI 45.50 kg/m2  SpO2 97% Well nourished, well developed, in no acute distress HEENT: normal Neck: no JVD Cardiac:  normal S1, S2; RRR; no murmur Lungs:  clear to auscultation bilaterally, no wheezing, rhonchi or  rales Abd: soft, nontender, no hepatomegaly Ext: no edema Skin: warm and dry Neuro:  CNs 2-12 intact, no focal abnormalities noted     ASSESSMENT AND PLAN:  1. OSA on CPAP and tolerating well - his d/l today showed 100% compliance in using more than 4 hours nightly - He will continue on current settings and I will try to get an AHI 2. Morbid Obesity -I congratulated him on his weight loss since I saw him last and I continued to stress the importance of a combination of diet as well as aerobic exercise. 3. Chronic diastolic CHF - well compensated - continue diuretic 4. Mildly dilated aortic root - recheck 2D echo to assess aortic root in April 2016       5. Chronic RBBB        6. HTN controlled - continue diuretic    Followup with me in 6 months.   Signed, Fransico Him, MD 04/25/2014 10:29 AM

## 2014-04-25 NOTE — Patient Instructions (Signed)
Continue current CPAP/BiPAP Settings.  Your physician wants you to follow-up in: 6 months with Dr Mallie Snooks will receive a reminder letter in the mail two months in advance. If you don't receive a letter, please call our office to schedule the follow-up appointment.

## 2014-05-23 ENCOUNTER — Telehealth: Payer: Self-pay | Admitting: Cardiology

## 2014-05-23 NOTE — Telephone Encounter (Signed)
New message    Status of new CPAP form - fax over few times  9/30,. 10/5 , 10/8

## 2014-05-23 NOTE — Telephone Encounter (Signed)
Spoke with Robin at [phone], re form to be signed for new CPap machine. Asked her to fax form to [phone] to my attention.

## 2014-05-23 NOTE — Telephone Encounter (Signed)
Form received and placed in Dr. Theodosia Blender folder.

## 2014-06-14 ENCOUNTER — Other Ambulatory Visit (INDEPENDENT_AMBULATORY_CARE_PROVIDER_SITE_OTHER): Payer: Self-pay | Admitting: Surgery

## 2014-06-14 ENCOUNTER — Other Ambulatory Visit (INDEPENDENT_AMBULATORY_CARE_PROVIDER_SITE_OTHER): Payer: Self-pay

## 2014-06-14 DIAGNOSIS — L989 Disorder of the skin and subcutaneous tissue, unspecified: Secondary | ICD-10-CM

## 2014-07-18 ENCOUNTER — Other Ambulatory Visit: Payer: Self-pay | Admitting: Gastroenterology

## 2014-08-14 ENCOUNTER — Encounter: Payer: Self-pay | Admitting: Cardiology

## 2014-08-20 ENCOUNTER — Encounter (HOSPITAL_COMMUNITY): Payer: Self-pay | Admitting: Emergency Medicine

## 2014-08-20 ENCOUNTER — Emergency Department (HOSPITAL_COMMUNITY): Payer: Medicare Other

## 2014-08-20 ENCOUNTER — Emergency Department (HOSPITAL_COMMUNITY)
Admission: EM | Admit: 2014-08-20 | Discharge: 2014-08-20 | Disposition: A | Discharge status: Home / Self Care | Payer: Medicare Other | Attending: Emergency Medicine | Admitting: Emergency Medicine

## 2014-08-20 DIAGNOSIS — Z7982 Long term (current) use of aspirin: Secondary | ICD-10-CM | POA: Diagnosis not present

## 2014-08-20 DIAGNOSIS — R6883 Chills (without fever): Secondary | ICD-10-CM | POA: Diagnosis present

## 2014-08-20 DIAGNOSIS — Z8669 Personal history of other diseases of the nervous system and sense organs: Secondary | ICD-10-CM | POA: Insufficient documentation

## 2014-08-20 DIAGNOSIS — I5032 Chronic diastolic (congestive) heart failure: Secondary | ICD-10-CM | POA: Insufficient documentation

## 2014-08-20 DIAGNOSIS — B349 Viral infection, unspecified: Secondary | ICD-10-CM

## 2014-08-20 DIAGNOSIS — J45909 Unspecified asthma, uncomplicated: Secondary | ICD-10-CM | POA: Insufficient documentation

## 2014-08-20 DIAGNOSIS — Z79899 Other long term (current) drug therapy: Secondary | ICD-10-CM | POA: Diagnosis not present

## 2014-08-20 DIAGNOSIS — Z7951 Long term (current) use of inhaled steroids: Secondary | ICD-10-CM | POA: Diagnosis not present

## 2014-08-20 DIAGNOSIS — R0989 Other specified symptoms and signs involving the circulatory and respiratory systems: Secondary | ICD-10-CM

## 2014-08-20 LAB — URINALYSIS, ROUTINE W REFLEX MICROSCOPIC
BILIRUBIN URINE: NEGATIVE
GLUCOSE, UA: NEGATIVE mg/dL
Hgb urine dipstick: NEGATIVE
Ketones, ur: NEGATIVE mg/dL
Nitrite: NEGATIVE
PH: 7.5 (ref 5.0–8.0)
Protein, ur: NEGATIVE mg/dL
Specific Gravity, Urine: 1.023 (ref 1.005–1.030)
Urobilinogen, UA: 1 mg/dL (ref 0.0–1.0)

## 2014-08-20 LAB — URINE MICROSCOPIC-ADD ON

## 2014-08-20 MED ORDER — IBUPROFEN 800 MG PO TABS
800.0000 mg | ORAL_TABLET | Freq: Once | ORAL | Status: AC
  Administered 2014-08-20: 800 mg via ORAL
  Filled 2014-08-20: qty 1

## 2014-08-20 MED ORDER — NAPROXEN 500 MG PO TABS: 500.0000 mg | ORAL_TABLET | Freq: Two times a day (BID) | ORAL | Status: DC

## 2014-08-20 NOTE — ED Provider Notes (Signed)
CSN: [id number]     Arrival date & time 08/20/14  0126 History  This chart was scribed for Derrich Gaby Alfonso Patten, MD by Randa Evens, ED Scribe. This patient was seen in room A04C/A04C and the patient's care was started at 1:39 AM.    Chief Complaint  Patient presents with  . Chills   Patient is a 69 y.o. male presenting with URI. The history is provided by the patient. No language interpreter was used.  URI Presenting symptoms: rhinorrhea   Presenting symptoms: no cough, no ear pain, no facial pain, no fever and no sore throat   Presenting symptoms comment:  Itchy watery eyes Severity:  Moderate Onset quality:  Sudden Timing:  Constant Progression:  Unchanged Chronicity:  New Relieved by:  Nothing Worsened by:  Nothing tried Ineffective treatments:  None tried Associated symptoms: no arthralgias, no myalgias, no neck pain and no wheezing   Risk factors: no recent illness and no recent travel    HPI Comments: Victor Medina is a 69 y.o. male who presents to the Emergency Department complaining of new sudden chills onset tonight 2-3 hours PTA. Pt states that he has some heart burn and has taken 1 aspirin. Pt state that he has had some rhinorrhea today as well. Denies any recent sick contacts. Denies dysuria,fever, sore throat or other related symptoms.  Past Medical History  Diagnosis Date  . Asthma   . OSA (obstructive sleep apnea)   . Morbid obesity   . Chronic diastolic CHF (congestive heart failure)   . RBBB    Past Surgical History  Procedure Laterality Date  . Shoulder surgery    . Cholecystectomy N/A 12/01/2012    Procedure: LAPAROSCOPIC CHOLECYSTECTOMY;  Surgeon: Gwenyth Ober, MD;  [facility];  Service: General;  Laterality: N/A;   Family History  Problem Relation Age of Onset  . Hypotension Mother    History  Substance Use Topics  . Smoking status: Never Smoker   . Smokeless tobacco: Not on file  . Alcohol Use: Yes     Comment: rarely    Review  of Systems  Constitutional: Positive for chills. Negative for fever.  HENT: Positive for rhinorrhea. Negative for drooling, ear pain, nosebleeds and sore throat.   Eyes: Negative for photophobia.  Respiratory: Negative for cough and wheezing.   Gastrointestinal: Negative for abdominal pain.  Genitourinary: Negative for dysuria, frequency, flank pain and difficulty urinating.  Musculoskeletal: Negative for myalgias, arthralgias, neck pain and neck stiffness.  Skin: Negative for rash.  All other systems reviewed and are negative.     Allergies  Review of patient's allergies indicates no known allergies.  Home Medications   Prior to Admission medications   Medication Sig Start Date End Date Taking? Authorizing Provider  albuterol (PROVENTIL HFA;VENTOLIN HFA) 108 (90 BASE) MCG/ACT inhaler Inhale 2 puffs into the lungs every 4 (four) hours as needed for wheezing or shortness of breath.    Historical Provider, MD  aspirin EC 81 MG tablet Take 81 mg by mouth daily.    Historical Provider, MD  beclomethasone (QVAR) 80 MCG/ACT inhaler Inhale 1 puff into the lungs 2 (two) times daily.    Historical Provider, MD  Cholecalciferol (VITAMIN D3) 2000 UNITS TABS Take 1,000 Units by mouth daily.     Historical Provider, MD  fish oil-omega-3 fatty acids 1000 MG capsule Take 2 g by mouth daily.    Historical Provider, MD  fluticasone (FLONASE) 50 MCG/ACT nasal spray Place 1 spray into both  nostrils daily.    Historical Provider, MD  hydrochlorothiazide (HYDRODIURIL) 25 MG tablet Take 1 tablet (25 mg total) by mouth daily. 10/26/13   Sueanne Margarita, MD  Multiple Vitamin (MULTIVITAMIN WITH MINERALS) TABS Take 1 tablet by mouth daily.    Historical Provider, MD  Probiotic Product (PROBIOTIC DAILY PO) Take 1 tablet by mouth 3 (three) times daily before meals.     Historical Provider, MD  simvastatin (ZOCOR) 20 MG tablet Take 20 mg by mouth daily.    Historical Provider, MD   Triage Vitals: BP 123/67 mmHg   Pulse 112  Temp(Src) 98.1 F (36.7 C) (Oral)  Resp 24  Ht 6\' 1"  (1.854 m)  Wt 342 lb (155.13 kg)  BMI 45.13 kg/m2  SpO2 96%  Physical Exam  Constitutional: He is oriented to person, place, and time. He appears well-developed and well-nourished. No distress.  HENT:  Head: Normocephalic and atraumatic.  Mouth/Throat: Oropharynx is clear and moist. No oropharyngeal exudate.  Clear colored postnasal drip.   Eyes: Conjunctivae and EOM are normal. Pupils are equal, round, and reactive to light.  Neck: Normal range of motion. Neck supple. No tracheal deviation present.  Cardiovascular: Normal rate and regular rhythm.   Pulmonary/Chest: Effort normal and breath sounds normal. No stridor. No respiratory distress. He has no wheezes. He has no rales.  Abdominal: Soft. He exhibits no mass. Bowel sounds are increased. There is no tenderness. There is no rebound and no guarding.  Musculoskeletal: Normal range of motion.  Lymphadenopathy:    He has no cervical adenopathy.  Neurological: He is alert and oriented to person, place, and time. He has normal reflexes.  Skin: Skin is warm and dry. No rash noted.  Psychiatric: He has a normal mood and affect. His behavior is normal.  Nursing note and vitals reviewed.   ED Course  Procedures (including critical care time) DIAGNOSTIC STUDIES: Oxygen Saturation is 96% on RA, adequate by my interpretation.    COORDINATION OF CARE: 1:49 AM-Discussed treatment plan with pt at bedside and pt agreed to plan.     Labs Review Labs Reviewed  URINALYSIS, ROUTINE W REFLEX MICROSCOPIC - Abnormal; Notable for the following:    APPearance CLOUDY (*)    Leukocytes, UA SMALL (*)    All other components within normal limits  URINE MICROSCOPIC-ADD ON - Abnormal; Notable for the following:    Squamous Epithelial / LPF FEW (*)    All other components within normal limits    Imaging Review Dg Chest 2 View  08/20/2014   CLINICAL DATA:  Acute onset of runny nose  and chills. Initial encounter.  EXAM: CHEST  2 VIEW  COMPARISON:  Chest radiograph performed 11/30/2012  FINDINGS: The lungs are well-aerated. Mild chronically increased interstitial markings are seen. There is no evidence of pleural effusion or pneumothorax.  The heart is borderline normal in size; the mediastinal contour is within normal limits. No acute osseous abnormalities are seen.  IMPRESSION: Mild chronically increased interstitial markings noted. No acute focal airspace consolidation seen.   Electronically Signed   By: Garald Balding M.D.   On: 08/20/2014 02:57     EKG Interpretation None      MDM   Final diagnoses:  Runny nose    Exam and vitals are benign and reassuring.  Has symptoms of rhinorrhea and itchy watery eyes.  Has had flu vaccine.  No rigors no teeth chattering well appearing patient is well appearing.  Exam and vitals are benign and reassuring.  No cough no urinary symptoms.  CXR negative no PNA.  Urine is contaminated.  Symptoms not consistent with urinary source.  No fever.  Suspect viral illness.   Follow up with your PMD in 24 hours for recheck.  Return sooner for any new or concerning symptoms, fever, stiffneck, rashes, cough productive or any concerns patient and wife verbalize understanding and agree to follow up  I personally performed the services described in this documentation, which was scribed in my presence. The recorded information has been reviewed and is accurate.      Carlisle Beers, MD 08/20/14 [phone]

## 2014-08-20 NOTE — ED Notes (Signed)
Pt. reports chills onset this evening , denies fever , no cough or congestion / denies body aches .

## 2014-08-20 NOTE — ED Notes (Signed)
Family at bedside. 

## 2014-10-15 ENCOUNTER — Encounter: Payer: Self-pay | Admitting: Cardiology

## 2014-11-19 ENCOUNTER — Other Ambulatory Visit: Payer: Self-pay | Admitting: Cardiology

## 2014-12-31 ENCOUNTER — Other Ambulatory Visit: Payer: Self-pay | Admitting: Dermatology

## 2015-03-31 ENCOUNTER — Other Ambulatory Visit: Payer: Self-pay | Admitting: Cardiology

## 2015-05-07 ENCOUNTER — Other Ambulatory Visit: Payer: Self-pay | Admitting: Cardiology

## 2015-05-08 ENCOUNTER — Other Ambulatory Visit: Payer: Self-pay | Admitting: Cardiology

## 2015-05-16 ENCOUNTER — Observation Stay (HOSPITAL_COMMUNITY)
Admission: EM | Admit: 2015-05-16 | Discharge: 2015-05-19 | Disposition: A | Discharge status: Home / Self Care | Payer: Medicare Other | Attending: Cardiovascular Disease | Admitting: Cardiovascular Disease

## 2015-05-16 ENCOUNTER — Encounter (HOSPITAL_COMMUNITY): Payer: Self-pay | Admitting: Nurse Practitioner

## 2015-05-16 DIAGNOSIS — G4733 Obstructive sleep apnea (adult) (pediatric): Secondary | ICD-10-CM | POA: Diagnosis not present

## 2015-05-16 DIAGNOSIS — K5900 Constipation, unspecified: Secondary | ICD-10-CM | POA: Insufficient documentation

## 2015-05-16 DIAGNOSIS — Z7982 Long term (current) use of aspirin: Secondary | ICD-10-CM | POA: Diagnosis not present

## 2015-05-16 DIAGNOSIS — Z6841 Body Mass Index (BMI) 40.0 and over, adult: Secondary | ICD-10-CM | POA: Insufficient documentation

## 2015-05-16 DIAGNOSIS — I5032 Chronic diastolic (congestive) heart failure: Secondary | ICD-10-CM | POA: Insufficient documentation

## 2015-05-16 DIAGNOSIS — Z7951 Long term (current) use of inhaled steroids: Secondary | ICD-10-CM | POA: Diagnosis not present

## 2015-05-16 DIAGNOSIS — Z79899 Other long term (current) drug therapy: Secondary | ICD-10-CM | POA: Insufficient documentation

## 2015-05-16 DIAGNOSIS — I4892 Unspecified atrial flutter: Secondary | ICD-10-CM | POA: Insufficient documentation

## 2015-05-16 DIAGNOSIS — Z791 Long term (current) use of non-steroidal anti-inflammatories (NSAID): Secondary | ICD-10-CM | POA: Insufficient documentation

## 2015-05-16 DIAGNOSIS — I4510 Unspecified right bundle-branch block: Secondary | ICD-10-CM | POA: Diagnosis present

## 2015-05-16 DIAGNOSIS — I77810 Thoracic aortic ectasia: Secondary | ICD-10-CM | POA: Diagnosis present

## 2015-05-16 DIAGNOSIS — I4891 Unspecified atrial fibrillation: Principal | ICD-10-CM | POA: Diagnosis present

## 2015-05-16 DIAGNOSIS — I77819 Aortic ectasia, unspecified site: Secondary | ICD-10-CM | POA: Diagnosis not present

## 2015-05-16 DIAGNOSIS — I110 Hypertensive heart disease with heart failure: Secondary | ICD-10-CM | POA: Insufficient documentation

## 2015-05-16 DIAGNOSIS — I10 Essential (primary) hypertension: Secondary | ICD-10-CM | POA: Diagnosis present

## 2015-05-16 DIAGNOSIS — J45909 Unspecified asthma, uncomplicated: Secondary | ICD-10-CM | POA: Insufficient documentation

## 2015-05-16 LAB — BASIC METABOLIC PANEL
Anion gap: 12 (ref 5–15)
BUN: 17 mg/dL (ref 6–20)
CALCIUM: 9.7 mg/dL (ref 8.9–10.3)
CHLORIDE: 102 mmol/L (ref 101–111)
CO2: 26 mmol/L (ref 22–32)
Creatinine, Ser: 1.19 mg/dL (ref 0.61–1.24)
GFR calc Af Amer: >60 mL/min (ref >60)
GFR calc non Af Amer: >60 mL/min (ref >60)
Glucose, Bld: 110 mg/dL — ABNORMAL HIGH (ref 65–99)
Potassium: 4.6 mmol/L (ref 3.5–5.1)
SODIUM: 140 mmol/L (ref 135–145)

## 2015-05-16 LAB — CBC
HCT: 52.4 % — ABNORMAL HIGH (ref 39.0–52.0)
Hemoglobin: 17.8 g/dL — ABNORMAL HIGH (ref 13.0–17.0)
MCH: 29.2 pg (ref 26.0–34.0)
MCHC: 34 g/dL (ref 30.0–36.0)
MCV: 85.9 fL (ref 78.0–100.0)
PLATELETS: 219 10*3/uL (ref 150–400)
RBC: 6.1 MIL/uL — AB (ref 4.22–5.81)
RDW: 13.5 % (ref 11.5–15.5)
WBC: 6.9 10*3/uL (ref 4.0–10.5)

## 2015-05-16 LAB — I-STAT TROPONIN, ED: TROPONIN I, POC: 0.01 ng/mL (ref 0.00–0.08)

## 2015-05-16 LAB — MAGNESIUM: MAGNESIUM: 2.2 mg/dL (ref 1.7–2.4)

## 2015-05-16 LAB — PHOSPHORUS: PHOSPHORUS: 3.4 mg/dL (ref 2.5–4.6)

## 2015-05-16 LAB — TSH: TSH: 2.848 u[IU]/mL (ref 0.350–4.500)

## 2015-05-16 LAB — TROPONIN I: Troponin I: <0.03 ng/mL (ref <0.031)

## 2015-05-16 LAB — BRAIN NATRIURETIC PEPTIDE: B Natriuretic Peptide: 372.7 pg/mL — ABNORMAL HIGH (ref 0.0–100.0)

## 2015-05-16 MED ORDER — FLUTICASONE PROPIONATE 50 MCG/ACT NA SUSP
1.0000 | Freq: Two times a day (BID) | NASAL | Status: DC
  Filled 2015-05-16 (×2): qty 16

## 2015-05-16 MED ORDER — OMEGA-3 FATTY ACIDS 1000 MG PO CAPS: 2.0000 g | ORAL_CAPSULE | Freq: Every day | ORAL | Status: DC

## 2015-05-16 MED ORDER — ATORVASTATIN CALCIUM 40 MG PO TABS
40.0000 mg | ORAL_TABLET | Freq: Every day | ORAL | Status: DC
  Administered 2015-05-17: 40 mg via ORAL
  Filled 2015-05-16: qty 1

## 2015-05-16 MED ORDER — VITAMIN D3 50 MCG (2000 UT) PO TABS: 1000.0000 [IU] | ORAL_TABLET | Freq: Every day | ORAL | Status: DC

## 2015-05-16 MED ORDER — ALBUTEROL SULFATE HFA 108 (90 BASE) MCG/ACT IN AERS: 2.0000 | INHALATION_SPRAY | RESPIRATORY_TRACT | Status: DC | PRN

## 2015-05-16 MED ORDER — DILTIAZEM HCL 100 MG IV SOLR
5.0000 mg/h | Freq: Once | INTRAVENOUS | Status: AC
  Administered 2015-05-16: 5 mg/h via INTRAVENOUS
  Filled 2015-05-16: qty 100

## 2015-05-16 MED ORDER — DILTIAZEM HCL 25 MG/5ML IV SOLN
10.0000 mg | Freq: Once | INTRAVENOUS | Status: AC
Start: 2015-05-16 — End: 2015-05-16
  Administered 2015-05-16: 10 mg via INTRAVENOUS
  Filled 2015-05-16: qty 5

## 2015-05-16 MED ORDER — LORATADINE 10 MG PO TABS
10.0000 mg | ORAL_TABLET | Freq: Every day | ORAL | Status: DC
  Administered 2015-05-17 – 2015-05-19 (×3): 10 mg via ORAL
  Filled 2015-05-16 (×3): qty 1

## 2015-05-16 MED ORDER — DILTIAZEM HCL 100 MG IV SOLR
5.0000 mg/h | INTRAVENOUS | Status: DC
  Administered 2015-05-16 – 2015-05-17 (×2): 15 mg/h via INTRAVENOUS
  Filled 2015-05-16 (×2): qty 100

## 2015-05-16 MED ORDER — ACETAMINOPHEN 325 MG PO TABS: 650.0000 mg | ORAL_TABLET | ORAL | Status: DC | PRN

## 2015-05-16 MED ORDER — BECLOMETHASONE DIPROPIONATE 80 MCG/ACT IN AERS: 1.0000 | INHALATION_SPRAY | Freq: Two times a day (BID) | RESPIRATORY_TRACT | Status: DC

## 2015-05-16 MED ORDER — ONDANSETRON HCL 4 MG/2ML IJ SOLN: 4.0000 mg | Freq: Four times a day (QID) | INTRAMUSCULAR | Status: DC | PRN

## 2015-05-16 MED ORDER — BUDESONIDE 0.25 MG/2ML IN SUSP
0.2500 mg | Freq: Two times a day (BID) | RESPIRATORY_TRACT | Status: DC
  Administered 2015-05-17 – 2015-05-19 (×4): 0.25 mg via RESPIRATORY_TRACT
  Filled 2015-05-16 (×5): qty 2

## 2015-05-16 MED ORDER — HYDROCHLOROTHIAZIDE 25 MG PO TABS
25.0000 mg | ORAL_TABLET | Freq: Every day | ORAL | Status: DC
  Administered 2015-05-17 – 2015-05-19 (×3): 25 mg via ORAL
  Filled 2015-05-16 (×3): qty 1

## 2015-05-16 MED ORDER — NITROGLYCERIN 0.4 MG SL SUBL: 0.4000 mg | SUBLINGUAL_TABLET | SUBLINGUAL | Status: DC | PRN

## 2015-05-16 MED ORDER — ALBUTEROL SULFATE (2.5 MG/3ML) 0.083% IN NEBU
2.5000 mg | INHALATION_SOLUTION | RESPIRATORY_TRACT | Status: DC | PRN
Start: 2015-05-16 — End: 2015-05-19

## 2015-05-16 MED ORDER — RIVAROXABAN 20 MG PO TABS
20.0000 mg | ORAL_TABLET | Freq: Every day | ORAL | Status: DC
  Administered 2015-05-16 – 2015-05-19 (×4): 20 mg via ORAL
  Filled 2015-05-16 (×4): qty 1

## 2015-05-16 MED ORDER — OMEGA-3-ACID ETHYL ESTERS 1 G PO CAPS
2.0000 g | ORAL_CAPSULE | Freq: Every day | ORAL | Status: DC
  Administered 2015-05-17 – 2015-05-19 (×3): 2 g via ORAL
  Filled 2015-05-16 (×3): qty 2

## 2015-05-16 MED ORDER — VITAMIN D 1000 UNITS PO TABS
1000.0000 [IU] | ORAL_TABLET | Freq: Every day | ORAL | Status: DC
  Administered 2015-05-17 – 2015-05-19 (×3): 1000 [IU] via ORAL
  Filled 2015-05-16 (×3): qty 1

## 2015-05-16 NOTE — Progress Notes (Signed)
ANTICOAGULATION CONSULT NOTE - Initial Consult  Pharmacy Consult for Xarelto Indication: atrial fibrillation  No Known Allergies  Patient Measurements:   Heparin Dosing Weight:   Vital Signs: Temp: 98.6 F (37 C) (10/07 1403) Temp Source: Oral (10/07 1403) BP: 131/78 mmHg (10/07 1600) Pulse Rate: 129 (10/07 1600)  Labs:  Recent Labs  05/16/15 1358  HGB 17.8*  HCT 52.4*  PLT 219  CREATININE 1.19    CrCl cannot be calculated (Unknown ideal weight.).   Medical History: Past Medical History  Diagnosis Date  . Asthma   . OSA (obstructive sleep apnea)   . Morbid obesity ([place])   . Chronic diastolic CHF (congestive heart failure) ([place])   . RBBB     Medications:   (Not in a hospital admission) Scheduled:  Infusions:   Assessment: 69yo male with history of asthma, morbid obesity, and CHF presents with acute onset Afib with RVR. Pharmacy is consulted to dose xarelto for atrial fibrillation. Hgb 17.8, Plt 219, sCr 1.19, BNP 372.7, Trop neg x1  Goal of Therapy:  Monitor platelets by anticoagulation protocol: Yes   Plan:  Xarelto 20mg  PO daily with supper Daily CBC Monitor s/sx of bleeding Educate patient on xarelto  Andrey Cota. Diona Foley, PharmD Clinical Pharmacist Pager [phone] 05/16/2015,4:37 PM

## 2015-05-16 NOTE — H&P (Signed)
Patient ID: TAMMIE ELLSWORTH MRN: [id number], DOB/AGE: [birth year]   Admit date: 05/16/2015   Primary Physician: Gavin Pound, MD Primary Cardiologist: Dr. Radford Pax  Pt. Profile:  69 year old male with past medical history of OSA on CPAP, morbid obesity, chronic diastolic HF, chronic RBBB, and dilated aortic root presented with new afib with RVR  Problem List  Past Medical History  Diagnosis Date  . Asthma   . OSA (obstructive sleep apnea)   . Morbid obesity ([place])   . Chronic diastolic CHF (congestive heart failure) ([place])   . RBBB     Past Surgical History  Procedure Laterality Date  . Shoulder surgery    . Cholecystectomy N/A 12/01/2012    Procedure: LAPAROSCOPIC CHOLECYSTECTOMY;  Surgeon: Gwenyth Ober, MD;  Location: [facility];  Service: General;  Laterality: N/A;     Allergies  No Known Allergies  HPI  The patient is a 69 year old male with PMH of OSA on CPAP, morbid obesity, chronic diastolic HF, chronic RBBB, and dilated aortic root. He is on HCTZ for chronic LE edema. His last Myoview in 2014 showed inferior perfusion defect in the inferior myocardium region consistent with diaphragmatic attenuation, remaining myocardium demonstrated normal myocardial perfusion with no evidence of ischemia or infarction, EF 52%, exercise capacity 7.0 METs. He has been doing well since his last office visit in April 2015. Last echocardiogram obtained on 11/09/2013 showed EF 55-60%, no regional wall motion abnormality, grade 1 diastolic dysfunction, mildly dilated left atrium, aortic root was normal in size, ascending aorta was mildly dilated.   He was in his usual state of health until this past Tuesday, since then, he has not been feeling well due to weakness and loss of energy. He denies any recent fever, chill, cough, presyncope or syncope. He denies any chest pain or significant shortness breath. In the last 2 days, he did have mild dizziness which was not very significant. Yesterday, he  started noticing palpitation and a fast heart rate. Eventually he decided to seek medical attention at [facility] on 05/16/2015, on arrival, he was noted to be in atrial fibrillation with RVR with heart rate 130s and also underlying RBBB. Initial troponin was negative, significant laboratory finding include creatinine of 1.19, hemoglobin 17.8. IV diltiazem was started in the ED. Cardiology has been consulted for newly diagnosed atrial fibrillation with RVR.   Home Medications  Prior to Admission medications   Medication Sig Start Date End Date Taking? Authorizing Provider  albuterol (PROVENTIL HFA;VENTOLIN HFA) 108 (90 BASE) MCG/ACT inhaler Inhale 2 puffs into the lungs every 4 (four) hours as needed for wheezing or shortness of breath.    Historical Provider, MD  aspirin EC 81 MG tablet Take 81 mg by mouth daily.    Historical Provider, MD  beclomethasone (QVAR) 80 MCG/ACT inhaler Inhale 1 puff into the lungs 2 (two) times daily.    Historical Provider, MD  Cholecalciferol (VITAMIN D3) 2000 UNITS TABS Take 1,000 Units by mouth daily.     Historical Provider, MD  fish oil-omega-3 fatty acids 1000 MG capsule Take 2 g by mouth daily.    Historical Provider, MD  fluticasone (FLONASE) 50 MCG/ACT nasal spray Place 1 spray into both nostrils 2 (two) times daily.     Historical Provider, MD  hydrochlorothiazide (HYDRODIURIL) 25 MG tablet Take 1 tablet (25 mg total) by mouth daily. 05/07/15   Sueanne Margarita, MD  hydrochlorothiazide (HYDRODIURIL) 25 MG tablet TAKE 1 TABLET (25 MG TOTAL) BY MOUTH DAILY. *  NEED APPT FOR MORE REFILLS* 05/08/15   Sueanne Margarita, MD  loratadine (CLARITIN) 10 MG tablet Take 10 mg by mouth daily.    Historical Provider, MD  Multiple Vitamin (MULTIVITAMIN WITH MINERALS) TABS Take 1 tablet by mouth daily.    Historical Provider, MD  naproxen (NAPROSYN) 500 MG tablet Take 1 tablet (500 mg total) by mouth 2 (two) times daily with a meal. 08/20/14   April Palumbo, MD  Probiotic  Product (PROBIOTIC DAILY PO) Take 1 tablet by mouth 3 (three) times daily before meals.     Historical Provider, MD  simvastatin (ZOCOR) 20 MG tablet Take 20 mg by mouth daily.    Historical Provider, MD  simvastatin (ZOCOR) 40 MG tablet Take 40 mg by mouth daily.    Historical Provider, MD    Family History  Family History  Problem Relation Age of Onset  . Hypotension Mother     Social History  Social History   Social History  . Marital Status: Married    Spouse Name: N/A  . Number of Children: N/A  . Years of Education: N/A   Occupational History  . Not on file.   Social History Main Topics  . Smoking status: Never Smoker   . Smokeless tobacco: Not on file  . Alcohol Use: Yes     Comment: rarely  . Drug Use: No  . Sexual Activity: No   Other Topics Concern  . Not on file   Social History Narrative     Review of Systems General:  No chills, fever, night sweats or weight changes.  Cardiovascular:  No chest pain, dyspnea on exertion, edema, orthopnea, paroxysmal nocturnal dyspnea. +palpitations Dermatological: No rash, lesions/masses  Respiratory: No cough, dyspnea Urologic: No hematuria, dysuria Abdominal:   No nausea, vomiting, diarrhea, bright red blood per rectum, melena, or hematemesis Neurologic:  No visual changes, changes in mental status. +dizziness, weakness All other systems reviewed and are otherwise negative except as noted above.  Physical Exam  Blood pressure 114/79, pulse 86, temperature 98.6 F (37 C), temperature source Oral, resp. rate 17, SpO2 98 %.  General: Pleasant, NAD Psych: Normal affect. Neuro: Alert and oriented X 3. Moves all extremities spontaneously. HEENT: Normal  Neck: Supple without bruits or JVD. Lungs:  Resp regular and unlabored, CTA. Heart: RRR no s3, s4, or murmurs. Abdomen: Soft, non-tender, non-distended, BS + x 4.  Extremities: No clubbing, cyanosis or edema. DP/PT/Radials 2+ and equal bilaterally. +pain with  pressing anterior tibial location which is chronic for him   Labs  Troponin [facility] of Care Test)  Recent Labs  05/16/15 1424  TROPIPOC 0.01   No results for input(s): CKTOTAL, CKMB, TROPONINI in the last 72 hours. Lab Results  Component Value Date   WBC 6.9 05/16/2015   HGB 17.8* 05/16/2015   HCT 52.4* 05/16/2015   MCV 85.9 05/16/2015   PLT 219 05/16/2015    Recent Labs Lab 05/16/15 1358  NA 140  K 4.6  CL 102  CO2 26  BUN 17  CREATININE 1.19  CALCIUM 9.7  GLUCOSE 110*    Radiology/Studies  No results found.  ECG  afib with RBBB  Echocardiogram 11/09/2013  LV EF: 55% -  60%  ------------------------------------------------------------ Indications:   447.71. CHF - 428.0.  ------------------------------------------------------------ History:  PMH: Acquired from the patient and from the patient's chart. Bilateral lower extremity edema. Right bundle branch block. Congestive heart failure. The dysfunction is primarily diastolic. Risk factors: Asthma OSA Hypertension. Morbidly obese.  ------------------------------------------------------------  Study Conclusions  - Left ventricle: The cavity size was normal. There was mild focal basal hypertrophy of the septum. Systolic function was normal. The estimated ejection fraction was in the range of 55% to 60%. Although no diagnostic regional wall motion abnormality was identified, this possibility cannot be completely excluded on the basis of this study. Doppler parameters are consistent with abnormal left ventricular relaxation (grade 1 diastolic dysfunction). - Left atrium: The atrium was mildly dilated.    ASSESSMENT AND PLAN  1. Newly diagnosed atrial flutter fibrillation with RVR: Unknown duration   - CHA2DS2-VASC score 2 (HF and age)  - Per patient, he was on Coumadin for unknown reason many years ago for short period, he had easy bruising, however no significant bleeding. He  has no recent bleeding episode or history of stroke.  - likely good candidate for NOAC. Discussed the need for systemic anticoagulation  - check TSH. Repeat Echo. Ideally will control HR and anticoagulate for 3 weeks before outpatient DCCV. If unable to control HR over the weekend, then will consider TEE DCCV on Monday  2. OSA on CPAP: compliant with CPAP  3. chronic diastolic HF: euvolemic on exam  4. morbid obesity  5. chronic RBBB  6. dilated aortic root: noted mildly dilated ascending aorta with normal aortic root in Apr 2015  Hilbert Corrigan, [place] 05/16/2015, 2:58 PM  I have seen and examined the patient along with Almyra Deforest, PA-C.  I have reviewed the chart, notes and new data.  I agree with PA's note.  Key new complaints: other than palpitations, he feels well. Lying fully supine, no dyspnea. Key examination changes: no overt CHF; severely obese, plethoric face, irregular rhythm Key new findings / data: polycythemia  PLAN: Start DOAC. Had a detailed discussion re: pros/cons/side effects/choices Rate control meds - IV dilt to transition to PO dilt (has asthma).  If he converts to NSR or we achieve good rate control by tomorrow, DC with AFib clinic follow up and, if necessary discuss DCCV in 3 weeks. If rate control is elusive or he remains very symptomatic after rate control is achieved, plan TEE-CV Monday. Should reevaluate CPAP prescription in view of elevated Hgb level and new AFib. Weigfht loss strongly recommended to reduce arrhythmia burden.  Sanda Klein, MD, [place] [phone] 05/16/2015, 4:09 PM

## 2015-05-16 NOTE — ED Notes (Signed)
MD at bedside. Cardiology 

## 2015-05-16 NOTE — ED Notes (Signed)
Cardiology PA at bedside. 

## 2015-05-16 NOTE — ED Provider Notes (Signed)
CSN: [id number]     Arrival date & time 05/16/15  1323 History   First MD Initiated Contact with Patient 05/16/15 1351     Chief Complaint  Patient presents with  . Atrial Fibrillation     (Consider location/radiation/quality/duration/timing/severity/associated sxs/prior Treatment) HPI   69 year old male who presents with new onset atrial fibrillation. History of OSA and chronic diastolic heart failure. Reports feeling unwell and "off" for the past few days. Initially attributed this to not taking his allergy medications, but symptoms persisted when he started taking his Claritin. Was working in the garden over the course of these past few days and reports that he has been feeling more tired with this. Yesterday evening developed chest pressure, and reports that he had difficulty breathing with his CPAP at night. During this period of time is also felt like his heart has been racing intermittently.  Denies lower extremity edema, abdominal distention, orthopnea or PND. Has not had any exertional chest pain, shortness of breath, or syncope. Currently just feels unwell.  Past Medical History  Diagnosis Date  . Asthma   . OSA (obstructive sleep apnea)   . Morbid obesity ([place])   . Chronic diastolic CHF (congestive heart failure) ([place])   . RBBB    Past Surgical History  Procedure Laterality Date  . Shoulder surgery    . Cholecystectomy N/A 12/01/2012    Procedure: LAPAROSCOPIC CHOLECYSTECTOMY;  Surgeon: Gwenyth Ober, MD;  Location: [facility] OR;  Service: General;  Laterality: N/A;   Family History  Problem Relation Age of Onset  . Hypotension Mother    Social History  Substance Use Topics  . Smoking status: Never Smoker   . Smokeless tobacco: None  . Alcohol Use: Yes     Comment: rarely    Review of Systems 10/14 systems reviewed and are negative other than those stated in the HPI    Allergies  Review of patient's allergies indicates no known allergies.  Home Medications   Prior  to Admission medications   Medication Sig Start Date End Date Taking? Authorizing Provider  albuterol (PROVENTIL HFA;VENTOLIN HFA) 108 (90 BASE) MCG/ACT inhaler Inhale 2 puffs into the lungs every 4 (four) hours as needed for wheezing or shortness of breath.   Yes Historical Provider, MD  aspirin EC 81 MG tablet Take 81 mg by mouth daily.   Yes Historical Provider, MD  atorvastatin (LIPITOR) 40 MG tablet Take 40 mg by mouth daily.   Yes Historical Provider, MD  beclomethasone (QVAR) 80 MCG/ACT inhaler Inhale 1 puff into the lungs 2 (two) times daily.   Yes Historical Provider, MD  Cholecalciferol (VITAMIN D3) 2000 UNITS TABS Take 1,000 Units by mouth daily.    Yes Historical Provider, MD  fish oil-omega-3 fatty acids 1000 MG capsule Take 2 g by mouth daily.   Yes Historical Provider, MD  fluticasone (FLONASE) 50 MCG/ACT nasal spray Place 1 spray into both nostrils 2 (two) times daily.    Yes Historical Provider, MD  hydrochlorothiazide (HYDRODIURIL) 25 MG tablet Take 1 tablet (25 mg total) by mouth daily. 05/07/15  Yes Sueanne Margarita, MD  loratadine (CLARITIN) 10 MG tablet Take 10 mg by mouth daily.   Yes Historical Provider, MD   BP 103/72 mmHg  Pulse 73  Temp(Src) 98 F (36.7 C) (Oral)  Resp 15  Wt 345 lb 9.6 oz (156.763 kg)  SpO2 96% Physical Exam   Physical Exam  Nursing note and vitals reviewed. Constitutional: Well developed, well nourished, non-toxic, and in  no acute distress Head: Normocephalic and atraumatic.  Mouth/Throat: Oropharynx is clear and moist.  Neck: Normal range of motion. Neck supple.  Cardiovascular: Tachycardic rate and irregularly irregular rhythm.   Pulmonary/Chest: Effort normal and breath sounds normal.  Abdominal: Soft. There is no tenderness. There is no rebound and no guarding. Obese. Non-distended Musculoskeletal: Normal range of motion.  Neurological: Alert, no facial droop, fluent speech, moves all extremities symmetrically Skin: Skin is warm and dry.   Psychiatric: Cooperative  ED Course  Procedures (including critical care time) Labs Review Labs Reviewed  BASIC METABOLIC PANEL - Abnormal; Notable for the following:    Glucose, Bld 110 (*)    All other components within normal limits  CBC - Abnormal; Notable for the following:    RBC 6.10 (*)    Hemoglobin 17.8 (*)    HCT 52.4 (*)    All other components within normal limits  BRAIN NATRIURETIC PEPTIDE - Abnormal; Notable for the following:    B Natriuretic Peptide 372.7 (*)    All other components within normal limits  TSH  MAGNESIUM  PHOSPHORUS  BASIC METABOLIC PANEL  CBC  TROPONIN I  TROPONIN I  TROPONIN I  I-STAT TROPOININ, ED    Imaging Review No results found. I have personally reviewed and evaluated these images and lab results as part of my medical decision-making.   EKG Interpretation   Date/Time:  Friday May 16 2015 13:46:02 EDT Ventricular Rate:  153 PR Interval:  156 QRS Duration: 130 QT Interval:  294 QTC Calculation: 469 R Axis:   -35 Text Interpretation:  Atrial fibrillation with RVR Left axis deviation  Right bundle branch block Abnormal ECG afib new Confirmed by Epifanio Labrador MD, Hinton Dyer  ([id number]) on 05/16/2015 2:21:08 PM     CRITICAL CARE Performed by: Forde Dandy   Total critical care time: 35 minutes  Critical care time was exclusive of separately billable procedures and treating other patients.  Critical care was necessary to treat or prevent imminent or life-threatening deterioration.  Critical care was time spent personally by me on the following activities: development of treatment plan with patient and/or surrogate as well as nursing, discussions with consultants, evaluation of patient's response to treatment, examination of patient, obtaining history from patient or surrogate, ordering and performing treatments and interventions, ordering and review of laboratory studies, ordering and review of radiographic studies, pulse oximetry and  re-evaluation of patient's condition.  MDM   Final diagnoses:  Atrial fibrillation with RVR (HCC)     History of OSA and diastolic heart failure with new onset atrial fibrillation with RVR. Hemodynamically stable, but seem very symptomatic. Not fluid overloaded on exam and without s/s of CHF exacerbation. Risk of thrombus given several days of symptoms. CHADs-2vasc score of 2, needing anticoagulation. Will defer heparin drip to cardiology as he may just need to be started on novel oral anticoagulant. No major electrolyte or metabolic derangements. Received 10 mg diltiazem and started on diltiazem drip. Admitted to cardiology service.    Forde Dandy, MD 05/16/15 2038

## 2015-05-16 NOTE — ED Notes (Signed)
Report attempted x 1

## 2015-05-16 NOTE — ED Notes (Signed)
He went into PCP office today for 1 week history of feeling lightheaded and shaky, they found he was in Afib and sent him to ED for evaluation. Denies pain, denies hx Afib

## 2015-05-17 DIAGNOSIS — I4510 Unspecified right bundle-branch block: Secondary | ICD-10-CM

## 2015-05-17 DIAGNOSIS — G4733 Obstructive sleep apnea (adult) (pediatric): Secondary | ICD-10-CM

## 2015-05-17 DIAGNOSIS — I5032 Chronic diastolic (congestive) heart failure: Secondary | ICD-10-CM | POA: Diagnosis not present

## 2015-05-17 DIAGNOSIS — I110 Hypertensive heart disease with heart failure: Secondary | ICD-10-CM | POA: Diagnosis not present

## 2015-05-17 DIAGNOSIS — I4892 Unspecified atrial flutter: Secondary | ICD-10-CM | POA: Diagnosis not present

## 2015-05-17 DIAGNOSIS — I10 Essential (primary) hypertension: Secondary | ICD-10-CM

## 2015-05-17 DIAGNOSIS — I77810 Thoracic aortic ectasia: Secondary | ICD-10-CM | POA: Diagnosis not present

## 2015-05-17 DIAGNOSIS — I4891 Unspecified atrial fibrillation: Secondary | ICD-10-CM | POA: Diagnosis not present

## 2015-05-17 LAB — CBC
HEMATOCRIT: 46.8 % (ref 39.0–52.0)
HEMOGLOBIN: 15.8 g/dL (ref 13.0–17.0)
MCH: 28.9 pg (ref 26.0–34.0)
MCHC: 33.8 g/dL (ref 30.0–36.0)
MCV: 85.6 fL (ref 78.0–100.0)
Platelets: 208 10*3/uL (ref 150–400)
RBC: 5.47 MIL/uL (ref 4.22–5.81)
RDW: 13.3 % (ref 11.5–15.5)
WBC: 7 10*3/uL (ref 4.0–10.5)

## 2015-05-17 LAB — BASIC METABOLIC PANEL
ANION GAP: 8 (ref 5–15)
BUN: 18 mg/dL (ref 6–20)
CALCIUM: 9.2 mg/dL (ref 8.9–10.3)
CHLORIDE: 103 mmol/L (ref 101–111)
CO2: 29 mmol/L (ref 22–32)
Creatinine, Ser: 1.31 mg/dL — ABNORMAL HIGH (ref 0.61–1.24)
GFR calc non Af Amer: 54 mL/min — ABNORMAL LOW (ref >60)
GLUCOSE: 131 mg/dL — AB (ref 65–99)
POTASSIUM: 3.5 mmol/L (ref 3.5–5.1)
Sodium: 140 mmol/L (ref 135–145)

## 2015-05-17 LAB — TROPONIN I: Troponin I: <0.03 ng/mL (ref <0.031)

## 2015-05-17 MED ORDER — METOPROLOL TARTRATE 25 MG PO TABS
25.0000 mg | ORAL_TABLET | Freq: Two times a day (BID) | ORAL | Status: DC
  Administered 2015-05-17 – 2015-05-19 (×5): 25 mg via ORAL
  Filled 2015-05-17 (×5): qty 1

## 2015-05-17 MED ORDER — ROSUVASTATIN CALCIUM 10 MG PO TABS
20.0000 mg | ORAL_TABLET | Freq: Every day | ORAL | Status: DC
  Administered 2015-05-18 – 2015-05-19 (×2): 20 mg via ORAL
  Filled 2015-05-17 (×2): qty 2

## 2015-05-17 MED ORDER — FUROSEMIDE 10 MG/ML IJ SOLN
20.0000 mg | Freq: Once | INTRAMUSCULAR | Status: AC
  Administered 2015-05-17: 20 mg via INTRAVENOUS
  Filled 2015-05-17: qty 2

## 2015-05-17 MED ORDER — DILTIAZEM HCL ER COATED BEADS 120 MG PO CP24
120.0000 mg | ORAL_CAPSULE | Freq: Every day | ORAL | Status: DC
  Administered 2015-05-17 – 2015-05-19 (×3): 120 mg via ORAL
  Filled 2015-05-17 (×4): qty 1

## 2015-05-17 NOTE — Progress Notes (Signed)
   Patient now complains on increased dyspnea and congestion. He appears comfortable on exam. O2 sats are stable at 98% on RA. Lungs are CTAB. However, he notes this is how he feels when he gets volume overloaded. He has known diastolic dysfunction and atrial fibrillation. Renal function, electrolytes and K are all stable. Will give a 1 x dose of IV Lasix to see how he responds. Monitor closely.   SIMMONS, BRITTAINY

## 2015-05-17 NOTE — Progress Notes (Signed)
Patient Profile: 69 year old male with past medical history of OSA on CPAP, morbid obesity, chronic diastolic HF, chronic RBBB, and dilated aortic root presented with new afib with RVR.  Subjective: No complaints. Resting in bed, reading a book. Denies any significant palpitations, dyspnea nor chest pain.   Objective: Vital signs in last 24 hours: Temp:  [97.9 F (36.6 C)-98.6 F (37 C)] 98 F (36.7 C) (10/08 0507) Pulse Rate:  [30-132] 132 (10/08 0836) Resp:  [15-24] 20 (10/08 0836) BP: (101-145)/(54-107) 122/67 mmHg (10/08 0836) SpO2:  [96 %-100 %] 99 % (10/08 0836) Weight:  [344 lb 9.3 oz (156.3 kg)-345 lb 9.6 oz (156.763 kg)] 344 lb 9.3 oz (156.3 kg) (10/08 0507) Last BM Date: 05/16/15  Intake/Output from previous day: 10/07 0701 - 10/08 0700 In: 456.3 [P.O.:240; I.V.:216.3] Out: 300 [Urine:300] Intake/Output this shift:    Medications Current Facility-Administered Medications  Medication Dose Route Frequency Provider Last Rate Last Dose  . acetaminophen (TYLENOL) tablet 650 mg  650 mg Oral Q4H PRN Almyra Deforest, PA      . albuterol (PROVENTIL) (2.5 MG/3ML) 0.083% nebulizer solution 2.5 mg  2.5 mg Nebulization Q4H PRN Mihai Croitoru, MD      . atorvastatin (LIPITOR) tablet 40 mg  40 mg Oral q1800 Almyra Deforest, PA      . budesonide (PULMICORT) nebulizer solution 0.25 mg  0.25 mg Nebulization BID Mihai Croitoru, MD   0.25 mg at 05/16/15 2212  . cholecalciferol (VITAMIN D) tablet 1,000 Units  1,000 Units Oral Daily Mihai Croitoru, MD      . diltiazem (CARDIZEM) 100 mg in dextrose 5 % 100 mL (1 mg/mL) infusion  5-15 mg/hr Intravenous Titrated Almyra Deforest, PA 15 mL/hr at 05/17/15 0623 15 mg/hr at 05/17/15 0623  . fluticasone (FLONASE) 50 MCG/ACT nasal spray 1 spray  1 spray Each Nare BID Almyra Deforest, PA   1 spray at 05/16/15 2107  . hydrochlorothiazide (HYDRODIURIL) tablet 25 mg  25 mg Oral Daily Almyra Deforest, [place]      . loratadine (CLARITIN) tablet 10 mg  10 mg Oral Daily Almyra Deforest, [place]      .  nitroGLYCERIN (NITROSTAT) SL tablet 0.4 mg  0.4 mg Sublingual Q5 Min x 3 PRN Almyra Deforest, PA      . omega-3 acid ethyl esters (LOVAZA) capsule 2 g  2 g Oral Daily Mihai Croitoru, MD      . ondansetron (ZOFRAN) injection 4 mg  4 mg Intravenous Q6H PRN Almyra Deforest, PA      . rivaroxaban (XARELTO) tablet 20 mg  20 mg Oral Q supper Rebecka Apley, RPH   20 mg at 05/16/15 1655    PE: General appearance: alert, cooperative, no distress and morbidly obese Neck: no carotid bruit and no JVD Lungs: clear to auscultation bilaterally Heart: irregularly irregular rhythm and tachy rate Extremities: no LEE Pulses: 2+ and symmetric Skin: warm and dry Neurologic: Grossly normal  Lab Results:   Recent Labs  05/16/15 1358 05/17/15 0126  WBC 6.9 7.0  HGB 17.8* 15.8  HCT 52.4* 46.8  PLT 219 208   BMET  Recent Labs  05/16/15 1358 05/17/15 0126  NA 140 140  K 4.6 3.5  CL 102 103  CO2 26 29  GLUCOSE 110* 131*  BUN 17 18  CREATININE 1.19 1.31*  CALCIUM 9.7 9.2   Cardiac Panel (last 3 results)  Recent Labs  05/16/15 2059 05/17/15 0126 05/17/15 0631  TROPONINI <0.03 <0.03 <0.03    Studies/Results:  2D echo 11/14/14 - pending   Assessment/Plan  Active Problems:   Atrial fibrillation with RVR (HCC)   1. Atrial Fibrillation: Newly diagnosed. TSH normal. 2D echo pending. Cardiac enzymes negative x 3. K and Mg both WNL. CHA2DS2 VASc score is at least 2, (HTN, Age 78-74). Now on Xarelto. Rate is still poorly controlled, in the 120s-130s, but overall tolerating ok. BP is stable. He is currently on IV Cardizem, 15 mg/hr. May consider addition of metoprolol for added rate control.  If unable to control rate, will likely need TEE/ DCCV on Monday.   2. OSA: continue CPAP therapy.   3. Diastolic CHF: euvolemic on physical exam. Continue to monitor volume status and for symptoms of acute exacerbation, given his risk in the setting of rapid atrial fibrillation.   4. RBBB: chronic  6. Dilated Aortic  Root: noted mildly dilated ascending aorta with normal aortic root in April 2015.   Brittainy M. Rosita Fire, PA-C 05/17/2015 9:08 AM

## 2015-05-18 DIAGNOSIS — I110 Hypertensive heart disease with heart failure: Secondary | ICD-10-CM | POA: Diagnosis not present

## 2015-05-18 DIAGNOSIS — I77810 Thoracic aortic ectasia: Secondary | ICD-10-CM

## 2015-05-18 DIAGNOSIS — I4891 Unspecified atrial fibrillation: Secondary | ICD-10-CM | POA: Diagnosis not present

## 2015-05-18 DIAGNOSIS — I10 Essential (primary) hypertension: Secondary | ICD-10-CM | POA: Diagnosis not present

## 2015-05-18 DIAGNOSIS — I4892 Unspecified atrial flutter: Secondary | ICD-10-CM | POA: Diagnosis not present

## 2015-05-18 DIAGNOSIS — I5032 Chronic diastolic (congestive) heart failure: Secondary | ICD-10-CM | POA: Diagnosis not present

## 2015-05-18 DIAGNOSIS — K5900 Constipation, unspecified: Secondary | ICD-10-CM

## 2015-05-18 MED ORDER — SODIUM CHLORIDE 0.9 % IJ SOLN: 3.0000 mL | Freq: Two times a day (BID) | INTRAMUSCULAR | Status: DC

## 2015-05-18 MED ORDER — SODIUM CHLORIDE 0.9 % IJ SOLN: 3.0000 mL | INTRAMUSCULAR | Status: DC | PRN

## 2015-05-18 MED ORDER — OFF THE BEAT BOOK
Freq: Once | Status: AC
  Administered 2015-05-18: 1
  Filled 2015-05-18: qty 1

## 2015-05-18 MED ORDER — POLYETHYLENE GLYCOL 3350 17 G PO PACK
17.0000 g | PACK | Freq: Once | ORAL | Status: AC
  Administered 2015-05-18: 17 g via ORAL
  Filled 2015-05-18: qty 1

## 2015-05-18 MED ORDER — SODIUM CHLORIDE 0.9 % IJ SOLN
3.0000 mL | Freq: Two times a day (BID) | INTRAMUSCULAR | Status: DC
  Administered 2015-05-18: 3 mL via INTRAVENOUS

## 2015-05-18 NOTE — Progress Notes (Signed)
Patient Profile: 69 year old male with past medical history of OSA on CPAP, morbid obesity, chronic diastolic HF, chronic RBBB, and dilated aortic root presented with new afib with RVR.  Subjective: Dyspneic overnight, given IV lasix. Breathing better today. I's/O's about even, but may not be accurate. Says he is constipated. Remains in a-fib, but rate-control is better, around 100. On po meds.  Objective: Vital signs in last 24 hours: Temp:  [97.7 F (36.5 C)] 97.7 F (36.5 C) (10/09 0510) Pulse Rate:  [104-128] 112 (10/09 1004) BP: (81-105)/(57-76) 102/76 mmHg (10/09 1004) SpO2:  [95 %-97 %] 96 % (10/09 0837) Weight:  [344 lb 5.7 oz (156.2 kg)] 344 lb 5.7 oz (156.2 kg) (10/09 0510) Last BM Date: 05/16/15  Intake/Output from previous day: 10/08 0701 - 10/09 0700 In: 360 [P.O.:360] Out: 500 [Urine:500] Intake/Output this shift: Total I/O In: 120 [P.O.:120] Out: -   Medications Current Facility-Administered Medications  Medication Dose Route Frequency Provider Last Rate Last Dose  . acetaminophen (TYLENOL) tablet 650 mg  650 mg Oral Q4H PRN Almyra Deforest, PA      . albuterol (PROVENTIL) (2.5 MG/3ML) 0.083% nebulizer solution 2.5 mg  2.5 mg Nebulization Q4H PRN Mihai Croitoru, MD      . budesonide (PULMICORT) nebulizer solution 0.25 mg  0.25 mg Nebulization BID Mihai Croitoru, MD   0.25 mg at 05/18/15 0837  . cholecalciferol (VITAMIN D) tablet 1,000 Units  1,000 Units Oral Daily Mihai Croitoru, MD   1,000 Units at 05/18/15 1000  . diltiazem (CARDIZEM CD) 24 hr capsule 120 mg  120 mg Oral Daily Mihai Croitoru, MD   120 mg at 05/18/15 1004  . fluticasone (FLONASE) 50 MCG/ACT nasal spray 1 spray  1 spray Each Nare BID Almyra Deforest, PA   1 spray at 05/16/15 2107  . hydrochlorothiazide (HYDRODIURIL) tablet 25 mg  25 mg Oral Daily Almyra Deforest, [place]   25 mg at 05/18/15 1004  . loratadine (CLARITIN) tablet 10 mg  10 mg Oral Daily Almyra Deforest, [place]   10 mg at 05/18/15 1000  . metoprolol tartrate  (LOPRESSOR) tablet 25 mg  25 mg Oral BID Pixie Casino, MD   25 mg at 05/18/15 1004  . nitroGLYCERIN (NITROSTAT) SL tablet 0.4 mg  0.4 mg Sublingual Q5 Min x 3 PRN Almyra Deforest, PA      . omega-3 acid ethyl esters (LOVAZA) capsule 2 g  2 g Oral Daily Mihai Croitoru, MD   2 g at 05/18/15 1000  . ondansetron (ZOFRAN) injection 4 mg  4 mg Intravenous Q6H PRN Almyra Deforest, PA      . rivaroxaban (XARELTO) tablet 20 mg  20 mg Oral Q supper Rebecka Apley, RPH   20 mg at 05/17/15 1601  . rosuvastatin (CRESTOR) tablet 20 mg  20 mg Oral q1800 Brittainy Erie Noe, PA-C   20 mg at 05/18/15 1000    PE: General appearance: alert, cooperative, no distress and morbidly obese Neck: no carotid bruit and no JVD Lungs: clear to auscultation bilaterally Heart: irregularly irregular rhythm and tachy rate Abdomen: protuberant, doughy, no rebound or guarding Extremities: no LEE Pulses: 2+ and symmetric Skin: warm and dry Neurologic: Grossly normal  Lab Results:   Recent Labs  05/16/15 1358 05/17/15 0126  WBC 6.9 7.0  HGB 17.8* 15.8  HCT 52.4* 46.8  PLT 219 208   BMET  Recent Labs  05/16/15 1358 05/17/15 0126  NA 140 140  K 4.6 3.5  CL 102 103  CO2 26 29  GLUCOSE 110* 131*  BUN 17 18  CREATININE 1.19 1.31*  CALCIUM 9.7 9.2   Cardiac Panel (last 3 results)  Recent Labs  05/16/15 2059 05/17/15 0126 05/17/15 0631  TROPONINI <0.03 <0.03 <0.03    Assessment/Plan  Principal Problem:   Atrial fibrillation with RVR (HCC) Active Problems:   Obstructive sleep apnea   Morbid obesity (HCC)   Aortic root dilatation (HCC)   RBBB   HTN (hypertension)   1. Atrial Fibrillation: Newly diagnosed. TSH normal. 2D echo pending. Cardiac enzymes negative x 3. K and Mg both WNL. CHA2DS2 VASc score is at least 2, (HTN, Age 1-74). Now on Xarelto. Rate better controlled on po agents today. Plan for TEE/cardioversion, hopefully tomorrow.  2. OSA: continue CPAP therapy.   3. Diastolic CHF: responded to IV  lasix yesterday. May be due to RVR. Rate better today, but remains in a-fib. Would dose lasix as needed.    4. RBBB: chronic  5. Constipation: will Rx for miralax today.  6. Dilated Aortic Root: noted mildly dilated ascending aorta with normal aortic root in April 2015.   Pixie Casino, MD, [facility] Attending Cardiologist [facility]   05/18/2015 11:35 AM

## 2015-05-19 ENCOUNTER — Other Ambulatory Visit: Payer: Self-pay | Admitting: Physician Assistant

## 2015-05-19 ENCOUNTER — Observation Stay (HOSPITAL_COMMUNITY): Payer: Medicare Other | Admitting: Anesthesiology

## 2015-05-19 ENCOUNTER — Encounter (HOSPITAL_COMMUNITY): Payer: Self-pay

## 2015-05-19 ENCOUNTER — Observation Stay (HOSPITAL_COMMUNITY): Payer: Medicare Other

## 2015-05-19 ENCOUNTER — Encounter (HOSPITAL_COMMUNITY)
Admission: EM | Disposition: A | Discharge status: Home / Self Care | Payer: Self-pay | Source: Home / Self Care | Attending: Emergency Medicine

## 2015-05-19 DIAGNOSIS — G4733 Obstructive sleep apnea (adult) (pediatric): Secondary | ICD-10-CM | POA: Diagnosis not present

## 2015-05-19 DIAGNOSIS — I110 Hypertensive heart disease with heart failure: Secondary | ICD-10-CM | POA: Diagnosis not present

## 2015-05-19 DIAGNOSIS — I480 Paroxysmal atrial fibrillation: Secondary | ICD-10-CM

## 2015-05-19 DIAGNOSIS — I10 Essential (primary) hypertension: Secondary | ICD-10-CM | POA: Diagnosis not present

## 2015-05-19 DIAGNOSIS — I4891 Unspecified atrial fibrillation: Secondary | ICD-10-CM | POA: Diagnosis not present

## 2015-05-19 DIAGNOSIS — I340 Nonrheumatic mitral (valve) insufficiency: Secondary | ICD-10-CM

## 2015-05-19 DIAGNOSIS — I77810 Thoracic aortic ectasia: Secondary | ICD-10-CM | POA: Diagnosis not present

## 2015-05-19 DIAGNOSIS — I5032 Chronic diastolic (congestive) heart failure: Secondary | ICD-10-CM | POA: Diagnosis not present

## 2015-05-19 DIAGNOSIS — I4892 Unspecified atrial flutter: Secondary | ICD-10-CM | POA: Diagnosis not present

## 2015-05-19 HISTORY — PX: CARDIOVERSION: SHX1299

## 2015-05-19 HISTORY — PX: TEE WITHOUT CARDIOVERSION: SHX5443

## 2015-05-19 LAB — BASIC METABOLIC PANEL
Anion gap: 7 (ref 5–15)
BUN: 20 mg/dL (ref 6–20)
CALCIUM: 9.2 mg/dL (ref 8.9–10.3)
CO2: 32 mmol/L (ref 22–32)
CREATININE: 1.3 mg/dL — AB (ref 0.61–1.24)
Chloride: 100 mmol/L — ABNORMAL LOW (ref 101–111)
GFR calc non Af Amer: 54 mL/min — ABNORMAL LOW (ref >60)
GLUCOSE: 108 mg/dL — AB (ref 65–99)
Potassium: 3.8 mmol/L (ref 3.5–5.1)
Sodium: 139 mmol/L (ref 135–145)

## 2015-05-19 SURGERY — ECHOCARDIOGRAM, TRANSESOPHAGEAL
Anesthesia: Monitor Anesthesia Care

## 2015-05-19 MED ORDER — RIVAROXABAN 20 MG PO TABS: 20.0000 mg | ORAL_TABLET | Freq: Every day | ORAL | Status: DC

## 2015-05-19 MED ORDER — BUTAMBEN-TETRACAINE-BENZOCAINE 2-2-14 % EX AERO
INHALATION_SPRAY | CUTANEOUS | Status: DC | PRN
  Administered 2015-05-19: 1 via TOPICAL

## 2015-05-19 MED ORDER — METOPROLOL TARTRATE 25 MG PO TABS: 25.0000 mg | ORAL_TABLET | Freq: Two times a day (BID) | ORAL | Status: DC

## 2015-05-19 MED ORDER — SODIUM CHLORIDE 0.9 % IV SOLN
250.0000 mL | INTRAVENOUS | Status: DC
  Administered 2015-05-19 (×2): via INTRAVENOUS

## 2015-05-19 MED ORDER — SODIUM CHLORIDE 0.9 % IV SOLN
INTRAVENOUS | Status: DC
  Administered 2015-05-19: 500 mL via INTRAVENOUS

## 2015-05-19 MED ORDER — PROPOFOL 10 MG/ML IV BOLUS
INTRAVENOUS | Status: DC | PRN
  Administered 2015-05-19 (×2): 20 mg via INTRAVENOUS
  Administered 2015-05-19: 10 mg via INTRAVENOUS

## 2015-05-19 MED ORDER — SODIUM CHLORIDE 0.9 % IV SOLN: 250.0000 mL | INTRAVENOUS | Status: DC | PRN

## 2015-05-19 MED ORDER — DILTIAZEM HCL ER COATED BEADS 120 MG PO CP24: 120.0000 mg | ORAL_CAPSULE | Freq: Every day | ORAL | Status: DC

## 2015-05-19 MED ORDER — LIDOCAINE HCL (CARDIAC) 20 MG/ML IV SOLN
INTRAVENOUS | Status: DC | PRN
  Administered 2015-05-19: 50 mg via INTRAVENOUS

## 2015-05-19 MED ORDER — PROPOFOL 500 MG/50ML IV EMUL
INTRAVENOUS | Status: DC | PRN
  Administered 2015-05-19: 75 ug/kg/min via INTRAVENOUS

## 2015-05-19 NOTE — Interval H&P Note (Signed)
History and Physical Interval Note:  05/19/2015 12:41 PM  [place]  has presented today for surgery, with the diagnosis of atrial fibrillation  The various methods of treatment have been discussed with the patient and family. After consideration of risks, benefits and other options for treatment, the patient has consented to  Procedure(s): TRANSESOPHAGEAL ECHOCARDIOGRAM (TEE) (N/A) CARDIOVERSION (N/A) as a surgical intervention .  The patient's history has been reviewed, patient examined, no change in status, stable for surgery.  I have reviewed the patient's chart and labs.  Questions were answered to the patient's satisfaction.     Dorris Carnes

## 2015-05-19 NOTE — Discharge Instructions (Addendum)
Information on my medicine - XARELTO (Rivaroxaban)  This medication education was reviewed with me or my healthcare representative as part of my discharge preparation.  The pharmacist that spoke with me during my hospital stay was:  Linda Hedges, [facility]  Why was Xarelto prescribed for you? Xarelto was prescribed for you to reduce the risk of a blood clot forming that can cause a stroke if you have a medical condition called atrial fibrillation (a type of irregular heartbeat).  What do you need to know about xarelto ? Take your Xarelto ONCE DAILY at the same time every day with your evening meal. If you have difficulty swallowing the tablet whole, you may crush it and mix in applesauce just prior to taking your dose.  Take Xarelto exactly as prescribed by your doctor and DO NOT stop taking Xarelto without talking to the doctor who prescribed the medication.  Stopping without other stroke prevention medication to take the place of Xarelto may increase your risk of developing a clot that causes a stroke.  Refill your prescription before you run out.  After discharge, you should have regular check-up appointments with your healthcare provider that is prescribing your Xarelto.  In the future your dose may need to be changed if your kidney function or weight changes by a significant amount.  What do you do if you miss a dose? If you are taking Xarelto ONCE DAILY and you miss a dose, take it as soon as you remember on the same day then continue your regularly scheduled once daily regimen the next day. Do not take two doses of Xarelto at the same time or on the same day.   Important Safety Information A possible side effect of Xarelto is bleeding. You should call your healthcare provider right away if you experience any of the following: ? Bleeding from an injury or your nose that does not stop. ? Unusual colored urine (red or dark brown) or unusual colored stools (red or black). ? Unusual  bruising for unknown reasons. ? A serious fall or if you hit your head (even if there is no bleeding).  Some medicines may interact with Xarelto and might increase your risk of bleeding while on Xarelto. To help avoid this, consult your healthcare provider or pharmacist prior to using any new prescription or non-prescription medications, including herbals, vitamins, non-steroidal anti-inflammatory drugs (NSAIDs) and supplements.  This website has more information on Xarelto: [email or web address]-benson.[email or web address]  Atrial Fibrillation Atrial fibrillation is a type of irregular or rapid heartbeat (arrhythmia). In atrial fibrillation, the heart quivers continuously in a chaotic pattern. This occurs when parts of the heart receive disorganized signals that make the heart unable to pump blood normally. This can increase the risk for stroke, heart failure, and other heart-related conditions. There are different types of atrial fibrillation, including:  Paroxysmal atrial fibrillation. This type starts suddenly, and it usually stops on its own shortly after it starts.  Persistent atrial fibrillation. This type often lasts longer than a week. It may stop on its own or with treatment.  Long-lasting persistent atrial fibrillation. This type lasts longer than 12 months.  Permanent atrial fibrillation. This type does not go away. Talk with your health care provider to learn about the type of atrial fibrillation that you have. CAUSES This condition is caused by some heart-related conditions or procedures, including:  A heart attack.  Coronary artery disease.  Heart failure.  Heart valve conditions.  High blood pressure.  Inflammation of the sac that surrounds  the heart (pericarditis).  Heart surgery.  Certain heart rhythm disorders, such as Wolf-Parkinson-White syndrome. Other causes include:  Pneumonia.  Obstructive sleep apnea.  Blockage of an artery in the lungs (pulmonary embolism, or PE).  Lung  cancer.  Chronic lung disease.  Thyroid problems, especially if the thyroid is overactive (hyperthyroidism).  Caffeine.  Excessive alcohol use or illegal drug use.  Use of some medicines, including certain decongestants and diet pills. Sometimes, the cause cannot be found. RISK FACTORS This condition is more likely to develop in:  People who are older in age.  People who smoke.  People who have diabetes mellitus.  People who are overweight (obese).  Athletes who exercise vigorously. SYMPTOMS Symptoms of this condition include:  A feeling that your heart is beating rapidly or irregularly.  A feeling of discomfort or pain in your chest.  Shortness of breath.  Sudden light-headedness or weakness.  Getting tired easily during exercise. In some cases, there are no symptoms. DIAGNOSIS Your health care provider may be able to detect atrial fibrillation when taking your pulse. If detected, this condition may be diagnosed with:  An electrocardiogram (ECG).  A Holter monitor test that records your heartbeat patterns over a 24-hour period.  Transthoracic echocardiogram (TTE) to evaluate how blood flows through your heart.  Transesophageal echocardiogram (TEE) to view more detailed images of your heart.  A stress test.  Imaging tests, such as a CT scan or chest X-ray.  Blood tests. TREATMENT The main goals of treatment are to prevent blood clots from forming and to keep your heart beating at a normal rate and rhythm. The type of treatment that you receive depends on many factors, such as your underlying medical conditions and how you feel when you are experiencing atrial fibrillation. This condition may be treated with:  Medicine to slow down the heart rate, bring the heart's rhythm back to normal, or prevent clots from forming.  Electrical cardioversion. This is a procedure that resets your heart's rhythm by delivering a controlled, low-energy shock to the heart  through your skin.  Different types of ablation, such as catheter ablation, catheter ablation with pacemaker, or surgical ablation. These procedures destroy the heart tissues that send abnormal signals. When the pacemaker is used, it is placed under your skin to help your heart beat in a regular rhythm. HOME CARE INSTRUCTIONS  Take over-the counter and prescription medicines only as told by your health care provider.  If your health care provider prescribed a blood-thinning medicine (anticoagulant), take it exactly as told. Taking too much blood-thinning medicine can cause bleeding. If you do not take enough blood-thinning medicine, you will not have the protection that you need against stroke and other problems.  Do not use tobacco products, including cigarettes, chewing tobacco, and e-cigarettes. If you need help quitting, ask your health care provider.  If you have obstructive sleep apnea, manage your condition as told by your health care provider.  Do not drink alcohol.  Do not drink beverages that contain caffeine, such as coffee, soda, and tea.  Maintain a healthy weight. Do not use diet pills unless your health care provider approves. Diet pills may make heart problems worse.  Follow diet instructions as told by your health care provider.  Exercise regularly as told by your health care provider.  Keep all follow-up visits as told by your health care provider. This is important. PREVENTION  Avoid drinking beverages that contain caffeine or alcohol.  Avoid certain medicines, especially medicines that are  used for breathing problems.  Avoid certain herbs and herbal medicines, such as those that contain ephedra or ginseng.  Do not use illegal drugs, such as cocaine and amphetamines.  Do not smoke.  Manage your high blood pressure. SEEK MEDICAL CARE IF:  You notice a change in the rate, rhythm, or strength of your heartbeat.  You are taking an anticoagulant and you notice  increased bruising.  You tire more easily when you exercise or exert yourself. SEEK IMMEDIATE MEDICAL CARE IF:  You have chest pain, abdominal pain, sweating, or weakness.  You feel nauseous.  You notice blood in your vomit, bowel movement, or urine.  You have shortness of breath.  You suddenly have swollen feet and ankles.  You feel dizzy.  You have sudden weakness or numbness of the face, arm, or leg, especially on one side of the body.  You have trouble speaking, trouble understanding, or both (aphasia).  Your face or your eyelid droops on one side. These symptoms may represent a serious problem that is an emergency. Do not wait to see if the symptoms will go away. Get medical help right away. Call your local emergency services (911 in the [place] Do not drive yourself to the hospital.   This information is not intended to replace advice given to you by your health care provider. Make sure you discuss any questions you have with your health care provider.   Document Released: 07/26/2005 Document Revised: 04/16/2015 Document Reviewed: 11/20/2014 Elsevier Interactive Patient Education [employer].

## 2015-05-19 NOTE — Progress Notes (Signed)
Subjective: Anxious about the TEE/DCCV  Objective: Vital signs in last 24 hours: Temp:  [98 F (36.7 C)-98.6 F (37 C)] 98 F (36.7 C) (10/10 0516) Pulse Rate:  [46-118] 46 (10/10 0516) Resp:  [18] 18 (10/09 1410) BP: (102-125)/(73-95) 105/78 mmHg (10/10 0516) SpO2:  [96 %-99 %] 96 % (10/10 0516) Weight:  [344 lb 2.2 oz (156.1 kg)] 344 lb 2.2 oz (156.1 kg) (10/10 0516) Last BM Date: 05/16/15  Intake/Output from previous day: 10/09 0701 - 10/10 0700 In: 480 [P.O.:480] Out: 600 [Urine:600] Intake/Output this shift:    Medications Scheduled Meds: . budesonide (PULMICORT) nebulizer solution  0.25 mg Nebulization BID  . cholecalciferol  1,000 Units Oral Daily  . diltiazem  120 mg Oral Daily  . fluticasone  1 spray Each Nare BID  . hydrochlorothiazide  25 mg Oral Daily  . loratadine  10 mg Oral Daily  . metoprolol tartrate  25 mg Oral BID  . omega-3 acid ethyl esters  2 g Oral Daily  . rivaroxaban  20 mg Oral Q supper  . rosuvastatin  20 mg Oral q1800  . sodium chloride  3 mL Intravenous Q12H  . sodium chloride  3 mL Intravenous Q12H   Continuous Infusions:  PRN Meds:.acetaminophen, albuterol, nitroGLYCERIN, ondansetron (ZOFRAN) IV, sodium chloride, sodium chloride  PE: General appearance: alert, cooperative, no distress and morbidly obese Lungs: clear to auscultation bilaterally Heart: irregularly irregular rhythm and No MM Extremities: No LEE Pulses: 1+ right radial and 2+ left Skin: Warm and dry Neurologic: Grossly normal  Lab Results:   Recent Labs  05/16/15 1358 05/17/15 0126  WBC 6.9 7.0  HGB 17.8* 15.8  HCT 52.4* 46.8  PLT 219 208   BMET  Recent Labs  05/16/15 1358 05/17/15 0126 05/19/15 0318  NA 140 140 139  K 4.6 3.5 3.8  CL 102 103 100*  CO2 26 29 32  GLUCOSE 110* 131* 108*  BUN 17 18 20   CREATININE 1.19 1.31* 1.30*  CALCIUM 9.7 9.2 9.2      Assessment/Plan  69 year old male with past medical history of OSA on CPAP, morbid  obesity, chronic diastolic HF, chronic RBBB, and dilated aortic root presented with new afib with RVR.  Principal Problem:   Atrial fibrillation with RVR (HCC) Active Problems:   Obstructive sleep apnea   Morbid obesity (HCC)   Aortic root dilatation (HCC)   RBBB   HTN (hypertension)   1. Atrial Fibrillation:  Newly diagnosed. TSH normal. 2D echo pending. Cardiac enzymes negative x 3. K and Mg both WNL. CHA2DS2 VASc score is at least 2, (HTN, Age 78-74). Now on Xarelto. Rate up this morning but overall ok.  On Dilt 120 and lopressor 25 BID today.  No room to titrate meds with BP.  Plan for TEE/cardioversion, hopefully today  2. OSA: continue CPAP therapy.   3. Diastolic CHF:  Net fluids: -0.1L/-0.1L.   Looks euvolemic.  May be due to RVR.  Remains in a-fib. Would dose lasix as needed.   4. RBBB: chronic  5. Constipation: Getting miralax.  6. Dilated Aortic Root: noted mildly dilated ascending aorta with normal aortic root in April 2015   Tarri Fuller PA-C 05/19/2015 6:37 AM   Agree with note written by Luisa Dago [facility]  Pt admitted with Afib with RVR. Currently rate controlled on BB/CCB. He is on Xarelto. Labs OK. Exam benign. For TEE/DCCV today. Can be D/C d after that with close OP F/U with Dr. Radford Pax.   Gwenlyn Found,  Roderic Palau 05/19/2015 9:43 AM

## 2015-05-19 NOTE — Transfer of Care (Signed)
Immediate Anesthesia Transfer of Care Note  Patient: Victor Medina  Procedure(s) Performed: Procedure(s): TRANSESOPHAGEAL ECHOCARDIOGRAM (TEE) (N/A) CARDIOVERSION (N/A)  Patient Location: Endoscopy Unit  Anesthesia Type:MAC  Level of Consciousness: awake, alert , oriented and patient cooperative  Airway & Oxygen Therapy: Patient Spontanous Breathing and Patient connected to nasal cannula oxygen  Post-op Assessment: Report given to RN and Post -op Vital signs reviewed and stable  Post vital signs: Reviewed  Last Vitals:  Filed Vitals:   05/19/15 1421  BP: 102/73  Pulse:   Temp: 36.7 C  Resp: 16    Complications: No apparent anesthesia complications

## 2015-05-19 NOTE — Care Management Note (Addendum)
Case Management Note  Patient Details  Name: Victor Medina MRN: [id number] Date of Birth: September 15, 1945  Subjective/Objective:    Atrial fibrillation with RVR                 Action/Plan: NCM spoke to pt and gave permission to speak to wife. Provided pt with Xarelto 30 day free trial card and brochure. Wife at home to assist with his care as needed. No DME needed at home.   Expected Discharge Date:  05/19/2015               Expected Discharge Plan:  Home/Self Care  In-House Referral:     Discharge planning Services  CM Consult, Medication Assistance   Status of Service:  Completed, signed off  Medicare Important Message Given:    Date Medicare IM Given:    Medicare IM give by:    Date Additional Medicare IM Given:    Additional Medicare Important Message give by:     If discussed at [place] of Stay Meetings, dates discussed:    Additional Comments: Xarelto copay is $40, no prior auth required.  Erenest Rasher, RN 05/19/2015, 4:32 PM

## 2015-05-19 NOTE — Anesthesia Postprocedure Evaluation (Signed)
  Anesthesia Post-op Note  Patient: Victor Medina  Procedure(s) Performed: Procedure(s): TRANSESOPHAGEAL ECHOCARDIOGRAM (TEE) (N/A) CARDIOVERSION (N/A)  Patient Location: PACU  Anesthesia Type:MAC  Level of Consciousness: awake and alert   Airway and Oxygen Therapy: Patient Spontanous Breathing  Post-op Pain: none  Post-op Assessment: Post-op Vital signs reviewed and Patient's Cardiovascular Status Stable              Post-op Vital Signs: Reviewed and stable  Last Vitals:  Filed Vitals:   05/19/15 1430  BP: 104/64  Pulse: 79  Temp:   Resp: 20    Complications: No apparent anesthesia complications

## 2015-05-19 NOTE — Op Note (Signed)
LA, LA appendage without evidence of thrombus  Full report to follow in CV section of chart.

## 2015-05-19 NOTE — Discharge Summary (Signed)
Discharge Summary   Patient ID: Victor Medina,  MRN: [id number], DOB/AGE: 1945-12-06 69 y.o.  Admit date: 05/16/2015 Discharge date: 05/19/2015  Primary Care Provider: Gavin Pound Primary Cardiologist: Dr. Radford Pax  Discharge Diagnoses Principal Problem:   Atrial fibrillation with RVR [facility]) Active Problems:   Obstructive sleep apnea   Morbid obesity ([place])   Aortic root dilatation (HCC)   RBBB   HTN (hypertension)   Allergies No Known Allergies  Procedures  TEE DCCV 05/19/2015  Aortic valve: AV is mildly thickened. No significant AI.  ------------------------------------------------------------------- Aorta: Minimal fixed plaquing of the thoracic aorta.  ------------------------------------------------------------------- Mitral valve: MV is mildly thickend. Mild MR  ------------------------------------------------------------------- Left atrium:  No evidence of thrombus in the atrial cavity or appendage.  ------------------------------------------------------------------- Tricuspid valve: TV is normal Mild TR.  ------------------------------------------------------------------- Pericardium: A small pericardial effusion was identified.  ------------------------------------------------------------------- Post procedure conclusions Ascending Aorta:  - Minimal fixed plaquing of the thoracic aorta.  Patient anesthetized by anesthesia with Propofol  With pads in AP position patient cardioverted to SR with 200 J synchronized biphasic energy.  Procedure without complication.      Hospital Course  The patient is a 69 year old male with past medical history of OSA on CPAP, morbid obesity, chronic diastolic HF, chronic RBBB, and dilated aortic root. He is on HCTZ for chronic LE edema. His last Myoview in 2014 showed inferior perfusion defect in the inferior myocardium region consistent with diaphragmatic attenuation, remaining myocardium demonstrated  normal myocardial perfusion with no evidence of ischemia or infarction, EF 52%, exercise capacity 7.0 METs. His last echocardiogram obtained on 11/09/2013 showed EF 55-60%, no regional wall motion abnormality, grade 1 diastolic dysfunction, mildly dilated left atrium, aortic root was normal in size, ascending aorta was mildly dilated.   He was in his usual state of health until this past Tuesday when he started having weakness and loss of energy. He denies any chest pain or significant shortness of breath, but had mild dizziness for the past 2 days. On arrival to [facility], he was noted to be in atrial flutter ablation with RVR with heart rate 130s and underlying RBBB. This atrial fibrillation is new for him. TSH was normal. Given CHA2DS2-VASC score 2 (HF and age), he was started on Xarelto. He was started on IV diltiazem for rate control.  He was later transitioned to metoprolol tartrate and PO diltiazem. In the afternoon of 10/8, he did have increasing shortness of breath and was given a single dose of IV Lasix. Unfortunately despite rate control, patient remained in atrial fibrillation.  After discussing various options, he agreed to undergo TEE DCCV.   He underwent the planned procedure on 05/19/2015 by Dr. Harrington Challenger, and was successfully converted to normal sinus rhythm after a single 200 J synchronized biphasic energy shock. Post cardioversion, he did not have significant complication. He was seen 2 hours after cardioversion, at which time he continued to maintain normal sinus rhythm. He is deemed stable for discharge from cardiology perspective.  Of note, his hgb was high on arrival, there was concern that his CPAP setting should be readjusted later. Of course, weight loss should also be encouraged to decrease arrhythmia burden.  Discharge Vitals Blood pressure 113/75, pulse 85, temperature 98 F (36.7 C), temperature source Oral, resp. rate 20, height 5\' 10"  (1.778 m), weight 344 lb 2.2 oz  (156.1 kg), SpO2 96 %.  Filed Weights   05/17/15 0507 05/18/15 0510 05/19/15 0516  Weight: 344 lb 9.3 oz (156.3 kg) 344 lb  5.7 oz (156.2 kg) 344 lb 2.2 oz (156.1 kg)    Labs  CBC  Recent Labs  05/17/15 0126  WBC 7.0  HGB 15.8  HCT 46.8  MCV 85.6  PLT 007   Basic Metabolic Panel  Recent Labs  05/17/15 0126 05/19/15 0318  NA 140 139  K 3.5 3.8  CL 103 100*  CO2 29 32  GLUCOSE 131* 108*  BUN 18 20  CREATININE 1.31* 1.30*  CALCIUM 9.2 9.2   Cardiac Enzymes  Recent Labs  05/16/15 2059 05/17/15 0126 05/17/15 0631  TROPONINI <0.03 <0.03 <0.03    Disposition  Pt is being discharged home today in good condition.  Follow-up Plans & Appointments      Follow-up Information    Follow up with Sueanne Margarita, MD.   Specialty:  Cardiology   Why:  Office scheduler will contact you to arrange followup, please give [place] a call if you do not hear from [place] in 2 business days   Contact information:   [address] [place] [address] [phone]       Discharge Medications    Medication List    STOP taking these medications        aspirin EC 81 MG tablet     fluticasone 50 MCG/ACT nasal spray  Commonly known as:  FLONASE      TAKE these medications        albuterol 108 (90 BASE) MCG/ACT inhaler  Commonly known as:  PROVENTIL HFA;VENTOLIN HFA  Inhale 2 puffs into the lungs every 4 (four) hours as needed for wheezing or shortness of breath.     atorvastatin 40 MG tablet  Commonly known as:  LIPITOR  Take 40 mg by mouth daily.     beclomethasone 80 MCG/ACT inhaler  Commonly known as:  QVAR  Inhale 1 puff into the lungs 2 (two) times daily.     diltiazem 120 MG 24 hr capsule  Commonly known as:  CARDIZEM CD  Take 1 capsule (120 mg total) by mouth daily.     fish oil-omega-3 fatty acids 1000 MG capsule  Take 2 g by mouth daily.     hydrochlorothiazide 25 MG tablet  Commonly known as:  HYDRODIURIL  Take 1 tablet (25 mg total) by  mouth daily.     loratadine 10 MG tablet  Commonly known as:  CLARITIN  Take 10 mg by mouth daily.     metoprolol tartrate 25 MG tablet  Commonly known as:  LOPRESSOR  Take 1 tablet (25 mg total) by mouth 2 (two) times daily.     rivaroxaban 20 MG Tabs tablet  Commonly known as:  XARELTO  Take 1 tablet (20 mg total) by mouth daily with supper.     Vitamin D3 2000 UNITS Tabs  Take 1,000 Units by mouth daily.        Duration of Discharge Encounter   Greater than 30 minutes including physician time.  Hilbert Corrigan PA-C Pager: [phone] 05/19/2015, 6:31 PM

## 2015-05-19 NOTE — Op Note (Signed)
Patient anesthetized by anesthesia with Propofol  With pads in AP position patient cardioverted to SR with 200 J synchronized biphasic energy.   Procedure without complication.

## 2015-05-19 NOTE — Anesthesia Preprocedure Evaluation (Addendum)
Anesthesia Evaluation  Patient identified by MRN, date of birth, ID band Patient awake    Reviewed: Allergy & Precautions, NPO status , Patient's Chart, lab work & pertinent test results  History of Anesthesia Complications Negative for: history of anesthetic complications  Airway Mallampati: II  TM Distance: >3 FB Neck ROM: Full    Dental  (+) Teeth Intact, Dental Advisory Given   Pulmonary asthma , sleep apnea and Continuous Positive Airway Pressure Ventilation ,    breath sounds clear to auscultation       Cardiovascular hypertension, Pt. on medications +CHF  + dysrhythmias  Rhythm:Regular Rate:Normal     Neuro/Psych negative neurological ROS  negative psych ROS   GI/Hepatic negative GI ROS, Neg liver ROS,   Endo/Other  Morbid obesity  Renal/GU negative Renal ROS  negative genitourinary   Musculoskeletal negative musculoskeletal ROS (+)   Abdominal   Peds negative pediatric ROS (+)  Hematology negative hematology ROS (+)   Anesthesia Other Findings   Reproductive/Obstetrics negative OB ROS                           Lab Results  Component Value Date   WBC 7.0 05/17/2015   HGB 15.8 05/17/2015   HCT 46.8 05/17/2015   MCV 85.6 05/17/2015   PLT 208 05/17/2015   Lab Results  Component Value Date   CREATININE 1.30* 05/19/2015   BUN 20 05/19/2015   NA 139 05/19/2015   K 3.8 05/19/2015   CL 100* 05/19/2015   CO2 32 05/19/2015   No results found for: INR, PROTIME  EKG: atrial fibrillation.   Anesthesia Physical Anesthesia Plan  ASA: III  Anesthesia Plan: MAC   Post-op Pain Management:    Induction: Intravenous  Airway Management Planned: Natural Airway and Nasal Cannula  Additional Equipment:   Intra-op Plan:   Post-operative Plan:   Informed Consent: I have reviewed the patients History and Physical, chart, labs and discussed the procedure including the risks,  benefits and alternatives for the proposed anesthesia with the patient or authorized representative who has indicated his/her understanding and acceptance.   Dental advisory given  Plan Discussed with:   Anesthesia Plan Comments:         Anesthesia Quick Evaluation

## 2015-05-19 NOTE — Progress Notes (Signed)
*  PRELIMINARY RESULTS* Echocardiogram Echocardiogram Transesophageal has been performed.  Leavy Cella 05/19/2015, 2:42 PM

## 2015-05-19 NOTE — Progress Notes (Signed)
ANTICOAGULATION CONSULT NOTE - Follow Up Consult  Pharmacy Consult for xarelto Indication: atrial fibrillation  No Known Allergies  Patient Measurements: Height: 5\' 10"  (177.8 cm) Weight: (!) 344 lb 2.2 oz (156.1 kg) IBW/kg (Calculated) : 73   Vital Signs: Temp: 98 F (36.7 C) (10/10 1421) Temp Source: Oral (10/10 1421) BP: 113/75 mmHg (10/10 1521) Pulse Rate: 79 (10/10 1430)  Labs:  Recent Labs  05/16/15 2059 05/17/15 0126 05/17/15 0631 05/19/15 0318  HGB  --  15.8  --   --   HCT  --  46.8  --   --   PLT  --  208  --   --   CREATININE  --  1.31*  --  1.30*  TROPONINI <0.03 <0.03 <0.03  --     Estimated Creatinine Clearance: 80.6 mL/min (by C-G formula based on Cr of 1.3).  Assessment: 69 yo male with afib on xarelto for afib. He is noted s/p DCCV to SR today. SCr= 1.3 with CrCl > 50.  Patient was educated on 05/17/15  Goal of Therapy:  Monitor platelets by anticoagulation protocol: Yes   Plan:   -Continue xarelto 20mg  po daily -Will sign off for now. Please contact pharmacy with any other needs  Alain Marion D 05/19/2015 3:33 PM

## 2015-05-19 NOTE — H&P (View-Only) (Signed)
Subjective: Anxious about the TEE/DCCV  Objective: Vital signs in last 24 hours: Temp:  [98 F (36.7 C)-98.6 F (37 C)] 98 F (36.7 C) (10/10 0516) Pulse Rate:  [46-118] 46 (10/10 0516) Resp:  [18] 18 (10/09 1410) BP: (102-125)/(73-95) 105/78 mmHg (10/10 0516) SpO2:  [96 %-99 %] 96 % (10/10 0516) Weight:  [344 lb 2.2 oz (156.1 kg)] 344 lb 2.2 oz (156.1 kg) (10/10 0516) Last BM Date: 05/16/15  Intake/Output from previous day: 10/09 0701 - 10/10 0700 In: 480 [P.O.:480] Out: 600 [Urine:600] Intake/Output this shift:    Medications Scheduled Meds: . budesonide (PULMICORT) nebulizer solution  0.25 mg Nebulization BID  . cholecalciferol  1,000 Units Oral Daily  . diltiazem  120 mg Oral Daily  . fluticasone  1 spray Each Nare BID  . hydrochlorothiazide  25 mg Oral Daily  . loratadine  10 mg Oral Daily  . metoprolol tartrate  25 mg Oral BID  . omega-3 acid ethyl esters  2 g Oral Daily  . rivaroxaban  20 mg Oral Q supper  . rosuvastatin  20 mg Oral q1800  . sodium chloride  3 mL Intravenous Q12H  . sodium chloride  3 mL Intravenous Q12H   Continuous Infusions:  PRN Meds:.acetaminophen, albuterol, nitroGLYCERIN, ondansetron (ZOFRAN) IV, sodium chloride, sodium chloride  PE: General appearance: alert, cooperative, no distress and morbidly obese Lungs: clear to auscultation bilaterally Heart: irregularly irregular rhythm and No MM Extremities: No LEE Pulses: 1+ right radial and 2+ left Skin: Warm and dry Neurologic: Grossly normal  Lab Results:   Recent Labs  05/16/15 1358 05/17/15 0126  WBC 6.9 7.0  HGB 17.8* 15.8  HCT 52.4* 46.8  PLT 219 208   BMET  Recent Labs  05/16/15 1358 05/17/15 0126 05/19/15 0318  NA 140 140 139  K 4.6 3.5 3.8  CL 102 103 100*  CO2 26 29 32  GLUCOSE 110* 131* 108*  BUN 17 18 20   CREATININE 1.19 1.31* 1.30*  CALCIUM 9.7 9.2 9.2      Assessment/Plan  69 year old male with past medical history of OSA on CPAP, morbid  obesity, chronic diastolic HF, chronic RBBB, and dilated aortic root presented with new afib with RVR.  Principal Problem:   Atrial fibrillation with RVR (HCC) Active Problems:   Obstructive sleep apnea   Morbid obesity (HCC)   Aortic root dilatation (HCC)   RBBB   HTN (hypertension)   1. Atrial Fibrillation:  Newly diagnosed. TSH normal. 2D echo pending. Cardiac enzymes negative x 3. K and Mg both WNL. CHA2DS2 VASc score is at least 2, (HTN, Age 83-74). Now on Xarelto. Rate up this morning but overall ok.  On Dilt 120 and lopressor 25 BID today.  No room to titrate meds with BP.  Plan for TEE/cardioversion, hopefully today  2. OSA: continue CPAP therapy.   3. Diastolic CHF:  Net fluids: -0.1L/-0.1L.   Looks euvolemic.  May be due to RVR.  Remains in a-fib. Would dose lasix as needed.   4. RBBB: chronic  5. Constipation: Getting miralax.  6. Dilated Aortic Root: noted mildly dilated ascending aorta with normal aortic root in April 2015   Tarri Fuller PA-C 05/19/2015 6:37 AM   Agree with note written by Luisa Dago [facility]  Pt admitted with Afib with RVR. Currently rate controlled on BB/CCB. He is on Xarelto. Labs OK. Exam benign. For TEE/DCCV today. Can be D/C d after that with close OP F/U with Dr. Radford Pax.   Gwenlyn Found,  Roderic Palau 05/19/2015 9:43 AM

## 2015-05-19 NOTE — Anesthesia Procedure Notes (Signed)
Procedure Name: MAC Date/Time: 05/19/2015 1:42 PM Performed by: Jenne Campus Pre-anesthesia Checklist: Patient identified, Emergency Drugs available, Suction available, Patient being monitored and Timeout performed Patient Re-evaluated:Patient Re-evaluated prior to inductionOxygen Delivery Method: Nasal cannula and Circle system utilized Preoxygenation: Pre-oxygenation with 100% oxygen

## 2015-05-19 NOTE — Interval H&P Note (Signed)
History and Physical Interval Note:  05/19/2015 1:07 PM  Victor Medina  has presented today for surgery, with the diagnosis of atrial fibrillation  The various methods of treatment have been discussed with the patient and family. After consideration of risks, benefits and other options for treatment, the patient has consented to  Procedure(s): TRANSESOPHAGEAL ECHOCARDIOGRAM (TEE) (N/A) CARDIOVERSION (N/A) as a surgical intervention .  The patient's history has been reviewed, patient examined, no change in status, stable for surgery.  I have reviewed the patient's chart and labs.  Questions were answered to the patient's satisfaction.     Dorris Carnes

## 2015-05-20 ENCOUNTER — Encounter (HOSPITAL_COMMUNITY): Payer: Self-pay | Admitting: Internal Medicine

## 2015-06-02 ENCOUNTER — Other Ambulatory Visit: Payer: Self-pay | Admitting: Cardiology

## 2015-06-03 ENCOUNTER — Encounter: Payer: Self-pay | Admitting: Cardiology

## 2015-06-03 ENCOUNTER — Ambulatory Visit (INDEPENDENT_AMBULATORY_CARE_PROVIDER_SITE_OTHER): Payer: Medicare Other | Admitting: Cardiology

## 2015-06-03 VITALS — BP 120/74 | HR 62 | Ht 70.0 in | Wt 352.8 lb

## 2015-06-03 DIAGNOSIS — I5032 Chronic diastolic (congestive) heart failure: Secondary | ICD-10-CM | POA: Diagnosis not present

## 2015-06-03 DIAGNOSIS — I10 Essential (primary) hypertension: Secondary | ICD-10-CM | POA: Diagnosis not present

## 2015-06-03 DIAGNOSIS — I4510 Unspecified right bundle-branch block: Secondary | ICD-10-CM

## 2015-06-03 DIAGNOSIS — Z7901 Long term (current) use of anticoagulants: Secondary | ICD-10-CM

## 2015-06-03 DIAGNOSIS — I4891 Unspecified atrial fibrillation: Secondary | ICD-10-CM | POA: Diagnosis not present

## 2015-06-03 NOTE — Patient Instructions (Addendum)
Medication Instructions:  Your physician recommends that you continue on your current medications as directed. Please refer to the Current Medication list given to you today.   Labwork: None ordered  Testing/Procedures: None ordered  Follow-Up: Your physician recommends that you schedule a follow-up appointment in: [place]   If you need a refill on your cardiac medications before your next appointment, please call your pharmacy.  If your dreams continue to be bizarre, just give [place] a call and we will look at changing your medication.Medication Instructions:

## 2015-06-03 NOTE — Progress Notes (Signed)
Cardiology Office Note   Date:  06/03/2015   ID:  Victor Medina, DOB 04-24-1946, MRN [id number]  PCP:  Cammy Copa, MD  Cardiologist:  Dr. Radford Pax    Chief Complaint  Patient presents with  . Hospitalization Follow-up    a fib with RVR      History of Present Illness: Victor Medina is a 69 y.o. male who presents for post hospitalization.    CHA2DS2-VASC score 2 (HF and age), he was started on Xarelto.  Underwent TEE DCCV to SR.Marland Kitchen   Today no complaints except nightmares which is on new meds.  Possible BB.  He can tolerate for now but once this bottle is out he may want to adjust meds.    He is maintaining SR and is not aware of any rapid or irregular beats.  No chest pain and no SOB. Denies any bleeding.  He has OSA and wears his CPAP, he brought in his disk which were downloaded.  He cannot afford a new CPap unit now.   His last Myoview in 2014 showed inferior perfusion defect in the inferior myocardium region consistent with diaphragmatic attenuation, remaining myocardium demonstrated normal myocardial perfusion with no evidence of ischemia or infarction, EF 52%, exercise capacity 7.0 METs. His last echocardiogram obtained on 11/09/2013 showed EF 55-60%, no regional wall motion abnormality, grade 1 diastolic dysfunction, mildly dilated left atrium, aortic root was normal in size, ascending aorta was mildly dilated.    Past Medical History  Diagnosis Date  . Asthma   . OSA (obstructive sleep apnea)   . Morbid obesity ([place])   . Chronic diastolic CHF (congestive heart failure) ([place])   . RBBB     Past Surgical History  Procedure Laterality Date  . Shoulder surgery    . Cholecystectomy N/A 12/01/2012    Procedure: LAPAROSCOPIC CHOLECYSTECTOMY;  Surgeon: Gwenyth Ober, MD;  Location: [place];  Service: General;  Laterality: N/A;  . Tee without cardioversion N/A 05/19/2015    Procedure: TRANSESOPHAGEAL ECHOCARDIOGRAM (TEE);  Surgeon: Fay Records, MD;  Location: [facility]  ENDOSCOPY;  Service: Cardiovascular;  Laterality: N/A;  . Cardioversion N/A 05/19/2015    Procedure: CARDIOVERSION;  Surgeon: Fay Records, MD;  Location: [facility] ENDOSCOPY;  Service: Cardiovascular;  Laterality: N/A;     Current Outpatient Prescriptions  Medication Sig Dispense Refill  . albuterol (PROVENTIL HFA;VENTOLIN HFA) 108 (90 BASE) MCG/ACT inhaler Inhale 2 puffs into the lungs every 4 (four) hours as needed for wheezing or shortness of breath.    Marland Kitchen atorvastatin (LIPITOR) 40 MG tablet Take 40 mg by mouth daily.    . Azelastine HCl 0.15 % SOLN Place 2 sprays into the nose daily.    . beclomethasone (QVAR) 80 MCG/ACT inhaler Inhale 1 puff into the lungs 2 (two) times daily.    . Cholecalciferol (VITAMIN D3) 2000 UNITS TABS Take 1,000 Units by mouth daily.     Marland Kitchen diltiazem (CARDIZEM CD) 120 MG 24 hr capsule Take 1 capsule (120 mg total) by mouth daily. 30 capsule 11  . fish oil-omega-3 fatty acids 1000 MG capsule Take 2 g by mouth daily.    . hydrochlorothiazide (HYDRODIURIL) 25 MG tablet Take 1 tablet (25 mg total) by mouth daily. 30 tablet 0  . ipratropium (ATROVENT) 0.03 % nasal spray Place 2 sprays into both nostrils 3 (three) times daily.    Marland Kitchen loratadine (CLARITIN) 10 MG tablet Take 10 mg by mouth daily.    . metoprolol tartrate (LOPRESSOR) 25 MG  tablet Take 1 tablet (25 mg total) by mouth 2 (two) times daily. 60 tablet 11  . rivaroxaban (XARELTO) 20 MG TABS tablet Take 1 tablet (20 mg total) by mouth daily with supper. 30 tablet 11   No current facility-administered medications for this visit.    Allergies:   Review of patient's allergies indicates no known allergies.    Social History:  The patient  reports that he has quit smoking. He does not have any smokeless tobacco history on file. He reports that he drinks alcohol. He reports that he does not use illicit drugs.   Family History:  The patient's family history includes Hypotension in his mother.    ROS:  General:no colds or  fevers, + wt increase Skin:no rashes or ulcers HEENT:no blurred vision, no congestion CV:see HPI PUL:see HPI GI:no diarrhea constipation or melena, no indigestion GU:no hematuria, no dysuria MS:no joint pain, no claudication Neuro:no syncope, no lightheadedness Endo:no diabetes, no thyroid disease  Wt Readings from Last 3 Encounters:  06/03/15 352 lb 12.8 oz (160.029 kg)  05/19/15 344 lb 2.2 oz (156.1 kg)  08/20/14 342 lb (155.13 kg)     PHYSICAL EXAM: VS:  BP 120/74 mmHg  Pulse 62  Ht 5\' 10"  (1.778 m)  Wt 352 lb 12.8 oz (160.029 kg)  BMI 50.62 kg/m2 , BMI Body mass index is 50.62 kg/(m^2). General:Pleasant affect, NAD Skin:Warm and dry, brisk capillary refill HEENT:normocephalic, sclera clear, mucus membranes moist Neck:supple, no JVD, no bruits  Heart:S1S2 RRR without murmur, gallup, rub or click Lungs:clear without rales, rhonchi, or wheezes DXI:PJAS, non tender, + BS, do not palpate liver spleen or masses Ext:tr lower ext edema, 2+ pedal pulses, 2+ radial pulses Neuro:alert and oriented, MAE, follows commands, + facial symmetry    EKG:  EKG is ordered today. The ekg ordered today demonstrates SR with RBBB, no acute changes.    Recent Labs: 05/16/2015: B Natriuretic Peptide 372.7*; Magnesium 2.2; TSH 2.848 05/17/2015: Hemoglobin 15.8; Platelets 208 05/19/2015: BUN 20; Creatinine, Ser 1.30*; Potassium 3.8; Sodium 139    Lipid Panel No results found for: CHOL, TRIG, HDL, CHOLHDL, VLDL, LDLCALC, LDLDIRECT     Other studies Reviewed: Additional studies/ records that were reviewed today include: TEE. Previous notes   ASSESSMENT AND PLAN:  1.  PAF s/p DCCV after TEE. Maintaining SR.    2. Anticoagulation on Xarelto,, no bleeding.  CHA2ds2vasC SCORE 2  3.  Possible med side effects of nightmares from BB.  If when his bottle is running out and if nightmares continue we will stop lopressor and try bystolic 5 mg daily.  4. OSA with CPAP, cannot afford new machine  now.  He brought in discs for download.   5. HTN controlled.   6.  RBBB  7. Morbid obesity.   Current medicines are reviewed with the patient today.  The patient Has no concerns regarding medicines.  The following changes have been made:  See above Labs/ tests ordered today include:see above  Disposition:   FU:  see above  Signed, Isaiah Serge, NP  06/03/2015 9:26 AM    [facility] [place], [place], [place], [place] Phone: [phone]; Fax: [phone]

## 2015-06-13 ENCOUNTER — Encounter: Payer: Self-pay | Admitting: Cardiology

## 2015-06-21 ENCOUNTER — Other Ambulatory Visit: Payer: Self-pay | Admitting: Cardiology

## 2015-06-26 ENCOUNTER — Other Ambulatory Visit: Payer: Self-pay | Admitting: Cardiology

## 2015-06-27 ENCOUNTER — Other Ambulatory Visit: Payer: Self-pay

## 2015-06-28 ENCOUNTER — Other Ambulatory Visit: Payer: Self-pay | Admitting: Cardiology

## 2015-06-29 ENCOUNTER — Other Ambulatory Visit: Payer: Self-pay | Admitting: Physician Assistant

## 2015-06-30 ENCOUNTER — Telehealth: Payer: Self-pay | Admitting: Cardiology

## 2015-06-30 DIAGNOSIS — G4733 Obstructive sleep apnea (adult) (pediatric): Secondary | ICD-10-CM

## 2015-06-30 NOTE — Telephone Encounter (Signed)
Informed patient that I would work on getting a Statistician for new supplies.  Instructed him that he is to keep his follow-up with Dr. Radford Pax in January.

## 2015-06-30 NOTE — Telephone Encounter (Signed)
Pt calling requesting a order be sent in for a new mask and hose. Pt states that he is going in to have his sleep machine service and would like this order sent in. Please advise

## 2015-08-14 ENCOUNTER — Other Ambulatory Visit: Payer: Self-pay | Admitting: Physician Assistant

## 2015-08-14 ENCOUNTER — Encounter: Payer: Self-pay | Admitting: Cardiology

## 2015-08-14 ENCOUNTER — Ambulatory Visit (INDEPENDENT_AMBULATORY_CARE_PROVIDER_SITE_OTHER): Payer: Medicare HMO | Admitting: Cardiology

## 2015-08-14 ENCOUNTER — Telehealth: Payer: Self-pay | Admitting: Cardiology

## 2015-08-14 VITALS — BP 106/80 | HR 98 | Ht 70.0 in | Wt 378.0 lb

## 2015-08-14 DIAGNOSIS — G4733 Obstructive sleep apnea (adult) (pediatric): Secondary | ICD-10-CM

## 2015-08-14 DIAGNOSIS — I480 Paroxysmal atrial fibrillation: Secondary | ICD-10-CM

## 2015-08-14 DIAGNOSIS — I5032 Chronic diastolic (congestive) heart failure: Secondary | ICD-10-CM

## 2015-08-14 DIAGNOSIS — I10 Essential (primary) hypertension: Secondary | ICD-10-CM

## 2015-08-14 DIAGNOSIS — I77810 Thoracic aortic ectasia: Secondary | ICD-10-CM

## 2015-08-14 HISTORY — DX: Paroxysmal atrial fibrillation: I48.0

## 2015-08-14 NOTE — Telephone Encounter (Signed)
Pt calling requesting a prior auth for Xarelto 20 mg tablet. I informed the pt that I would forward a message to our prior auth department. Please advise

## 2015-08-14 NOTE — Progress Notes (Addendum)
Cardiology Office Note   Date:  08/14/2015   ID:  Victor Medina, DOB 1945-08-18, MRN [id number]  PCP:  Cammy Copa, MD    Chief Complaint  Patient presents with  . Sleep Apnea  . Congestive Heart Failure  . Atrial Fibrillation      History of Present Illness: Victor Medina is a 70 y.o. male with a history of OSA and morbid obesity, chronic diastolic CHF, chronic RBBB and mildly dilated aortic root who presents today for followup. He was recently diagnosed with new onset afib with RVR and was started on Xarelto and underwent TEE/DCCV to NSR.   He is doing well. He tolerates his CPAP without problems. He tolerates his full face mask and feels the pressure is adequate. He does not feel rested in the am because he has to get up to go to the bathroom and has no daytime sleepiness. He does not think that he snores. He denies any chest pain, SOB, DOE, dizziness, palpitations or syncope. He has chronic LE edema controlled on diuretics.  He denies any claudication symptoms.      Past Medical History  Diagnosis Date  . Asthma   . OSA (obstructive sleep apnea)   . Morbid obesity ([place])   . Chronic diastolic CHF (congestive heart failure) ([place])   . RBBB   . PAF (paroxysmal atrial fibrillation) ([place]) 08/14/2015    s/p TEE/DCCV to NSR    Past Surgical History  Procedure Laterality Date  . Shoulder surgery    . Cholecystectomy N/A 12/01/2012    Procedure: LAPAROSCOPIC CHOLECYSTECTOMY;  Surgeon: Gwenyth Ober, MD;  Location: [place];  Service: General;  Laterality: N/A;  . Tee without cardioversion N/A 05/19/2015    Procedure: TRANSESOPHAGEAL ECHOCARDIOGRAM (TEE);  Surgeon: Fay Records, MD;  Location: [facility] ENDOSCOPY;  Service: Cardiovascular;  Laterality: N/A;  . Cardioversion N/A 05/19/2015    Procedure: CARDIOVERSION;  Surgeon: Fay Records, MD;  Location: [facility] ENDOSCOPY;  Service: Cardiovascular;  Laterality: N/A;     Current Outpatient Prescriptions    Medication Sig Dispense Refill  . albuterol (PROVENTIL HFA;VENTOLIN HFA) 108 (90 BASE) MCG/ACT inhaler Inhale 2 puffs into the lungs every 4 (four) hours as needed for wheezing or shortness of breath.    Marland Kitchen atorvastatin (LIPITOR) 40 MG tablet Take 40 mg by mouth daily.    . Azelastine HCl 0.15 % SOLN Place 2 sprays into the nose daily.    . beclomethasone (QVAR) 80 MCG/ACT inhaler Inhale 1 puff into the lungs 2 (two) times daily.    . Cholecalciferol (VITAMIN D3) 2000 UNITS TABS Take 1,000 Units by mouth daily.     Marland Kitchen diltiazem (CARDIZEM CD) 120 MG 24 hr capsule Take 1 capsule (120 mg total) by mouth daily. 30 capsule 11  . fish oil-omega-3 fatty acids 1000 MG capsule Take 2 g by mouth daily.    . hydrochlorothiazide (HYDRODIURIL) 25 MG tablet Take 1 tablet (25 mg total) by mouth daily. 30 tablet 5  . ipratropium (ATROVENT) 0.03 % nasal spray Place 2 sprays into both nostrils 3 (three) times daily.    Marland Kitchen loratadine (CLARITIN) 10 MG tablet Take 10 mg by mouth daily.    . metoprolol tartrate (LOPRESSOR) 25 MG tablet Take 1 tablet (25 mg total) by mouth 2 (two) times daily. 60 tablet 11  . rivaroxaban (XARELTO) 20 MG TABS tablet Take 1 tablet (20 mg  total) by mouth daily with supper. (Patient not taking: Reported on 08/14/2015) 30 tablet 11   No current facility-administered medications for this visit.    Allergies:   Review of patient's allergies indicates no known allergies.    Social History:  The patient  reports that he has quit smoking. He does not have any smokeless tobacco history on file. He reports that he drinks alcohol. He reports that he does not use illicit drugs.   Family History:  The patient's family history includes Hypotension in his mother.    ROS:  Please see the history of present illness.   Otherwise, review of systems are positive for none.   All other systems are reviewed and negative.    PHYSICAL EXAM: VS:  BP 106/80 mmHg  Pulse 98  Ht 5\' 10"  (1.778 m)  Wt 378 lb  (171.46 kg)  BMI 54.24 kg/m2  SpO2 98% , BMI Body mass index is 54.24 kg/(m^2). GEN: Well nourished, well developed, in no acute distress HEENT: normal Neck: no JVD, carotid bruits, or masses Cardiac: RRR; no murmurs, rubs, or gallops,no edema  Respiratory:  clear to auscultation bilaterally, normal work of breathing GI: soft, nontender, nondistended, + BS MS: no deformity or atrophy Skin: warm and dry, no rash Neuro:  Strength and sensation are intact Psych: euthymic mood, full affect   EKG:  EKG is not ordered today.    Recent Labs: 05/16/2015: B Natriuretic Peptide 372.7*; Magnesium 2.2; TSH 2.848 05/17/2015: Hemoglobin 15.8; Platelets 208 05/19/2015: BUN 20; Creatinine, Ser 1.30*; Potassium 3.8; Sodium 139    Lipid Panel No results found for: CHOL, TRIG, HDL, CHOLHDL, VLDL, LDLCALC, LDLDIRECT    Wt Readings from Last 3 Encounters:  08/14/15 378 lb (171.46 kg)  06/03/15 352 lb 12.8 oz (160.029 kg)  05/19/15 344 lb 2.2 oz (156.1 kg)    ASSESSMENT AND PLAN:  1.   OSA on CPAP and tolerating well - I will get a d/l from DME- He will continue on current settings .  He would like a new machine as his is old.  I will order a new Resmed CPAP at 9cm H2O. 2.   Morbid Obesity -  I continued to stress the importance of a combination of diet as well as aerobic exercise. 3.  Chronic diastolic CHF - well compensated - continue diuretic/BB 4. Mildly dilated aortic root - recheck 2D echo to assess aortic root   5. Chronic RBBB   6. HTN controlled - continue diuretic/BB/CCB        7.  PAF s/p TEE/DCCV maintaining NSR on Xarelto and BB.  CHADS2VASC score is 2 continue Xarelto.      Current medicines are reviewed at length with the patient today.  The patient does not have concerns regarding medicines.  The following changes have been made:  no change  Labs/ tests ordered today: See above Assessment and Plan No orders of the defined types were placed in this encounter.       Disposition:   FU with me in 6 months  Signed, Sueanne Margarita, MD  08/14/2015 9:12 AM    [facility] [place], [place], [place]  [address] Phone: [phone]; Fax: [phone]

## 2015-08-14 NOTE — Patient Instructions (Addendum)
Medication Instructions:  Your physician recommends that you continue on your current medications as directed. Please refer to the Current Medication list given to you today.   Labwork: None  Testing/Procedures: Your physician has requested that you have an echocardiogram. Echocardiography is a painless test that uses sound waves to create images of your heart. It provides your doctor with information about the size and shape of your heart and how well your heart's chambers and valves are working. This procedure takes approximately one hour. There are no restrictions for this procedure.  Follow-Up: Your physician wants you to follow-up in: 6 months with Dr. Turner. You will receive a reminder letter in the mail two months in advance. If you don't receive a letter, please call our office to schedule the follow-up appointment.   Any Other Special Instructions Will Be Listed Below (If Applicable).     If you need a refill on your cardiac medications before your next appointment, please call your pharmacy.   

## 2015-08-15 NOTE — Telephone Encounter (Signed)
Prior auth for Xarelto 20 mg sent to [facility].

## 2015-08-18 ENCOUNTER — Other Ambulatory Visit (HOSPITAL_COMMUNITY): Payer: Medicare HMO

## 2015-08-25 ENCOUNTER — Telehealth: Payer: Self-pay | Admitting: *Deleted

## 2015-08-25 DIAGNOSIS — G4733 Obstructive sleep apnea (adult) (pediatric): Secondary | ICD-10-CM

## 2015-08-25 NOTE — Addendum Note (Signed)
Addended by: Andres Ege on: 08/25/2015 04:06 PM   Modules accepted: Orders

## 2015-08-25 NOTE — Addendum Note (Signed)
Addended by: Fransico Him R on: 08/25/2015 03:36 PM   Modules accepted: Miquel Dunn

## 2015-08-25 NOTE — Telephone Encounter (Signed)
Please order new ResMed CPAP at 9cm H2O with heated humidity

## 2015-08-25 NOTE — Telephone Encounter (Signed)
I have a note from the office visit that patient is in need of new CPAP machine.    I spoke with DME, and they need that office visit to say that he is in need of a new machine. They also need an order faxed to them at [phone]. Routed this message to Dr. Radford Pax to see if she can addend the office note stating that he is in need of a new machine. Once that is complete and she gives me an order, I will fax it over.

## 2015-08-25 NOTE — Telephone Encounter (Signed)
Orders placed. Will send the order and office note over

## 2015-08-28 ENCOUNTER — Other Ambulatory Visit (HOSPITAL_COMMUNITY): Payer: Medicare HMO

## 2015-09-05 DIAGNOSIS — G4733 Obstructive sleep apnea (adult) (pediatric): Secondary | ICD-10-CM | POA: Diagnosis not present

## 2015-09-09 ENCOUNTER — Ambulatory Visit (HOSPITAL_COMMUNITY): Payer: Medicare HMO | Attending: Cardiology

## 2015-09-09 ENCOUNTER — Other Ambulatory Visit: Payer: Self-pay

## 2015-09-09 DIAGNOSIS — I77810 Thoracic aortic ectasia: Secondary | ICD-10-CM | POA: Diagnosis not present

## 2015-09-09 DIAGNOSIS — I10 Essential (primary) hypertension: Secondary | ICD-10-CM

## 2015-09-09 DIAGNOSIS — I340 Nonrheumatic mitral (valve) insufficiency: Secondary | ICD-10-CM | POA: Diagnosis not present

## 2015-09-16 ENCOUNTER — Telehealth: Payer: Self-pay

## 2015-09-16 DIAGNOSIS — I77810 Thoracic aortic ectasia: Secondary | ICD-10-CM

## 2015-09-16 NOTE — Telephone Encounter (Signed)
Informed patient of results and verbal understanding expressed.   Repeat ECHO ordered for scheduling in 1 year. Patient agrees with treatment plan. 

## 2015-09-16 NOTE — Telephone Encounter (Signed)
-----   Message from Sueanne Margarita, MD sent at 09/10/2015  5:00 PM EST ----- Echo showed low normal LVF with EF 50%, mildly dilated aortic root, mildly reduced RVF but normal size, This is most likely secondary to his OSA.  Repeat echo in 1 year for dilated aortic root

## 2015-10-06 DIAGNOSIS — G4733 Obstructive sleep apnea (adult) (pediatric): Secondary | ICD-10-CM | POA: Diagnosis not present

## 2015-10-28 DIAGNOSIS — H2513 Age-related nuclear cataract, bilateral: Secondary | ICD-10-CM | POA: Diagnosis not present

## 2015-10-28 DIAGNOSIS — H35031 Hypertensive retinopathy, right eye: Secondary | ICD-10-CM | POA: Diagnosis not present

## 2015-10-28 DIAGNOSIS — H35032 Hypertensive retinopathy, left eye: Secondary | ICD-10-CM | POA: Diagnosis not present

## 2015-10-28 DIAGNOSIS — H40013 Open angle with borderline findings, low risk, bilateral: Secondary | ICD-10-CM | POA: Diagnosis not present

## 2015-10-28 DIAGNOSIS — H43813 Vitreous degeneration, bilateral: Secondary | ICD-10-CM | POA: Diagnosis not present

## 2015-10-28 DIAGNOSIS — H35372 Puckering of macula, left eye: Secondary | ICD-10-CM | POA: Diagnosis not present

## 2015-10-28 DIAGNOSIS — H25013 Cortical age-related cataract, bilateral: Secondary | ICD-10-CM | POA: Diagnosis not present

## 2015-11-03 DIAGNOSIS — G4733 Obstructive sleep apnea (adult) (pediatric): Secondary | ICD-10-CM | POA: Diagnosis not present

## 2015-11-18 DIAGNOSIS — Z125 Encounter for screening for malignant neoplasm of prostate: Secondary | ICD-10-CM | POA: Diagnosis not present

## 2015-11-18 DIAGNOSIS — G4733 Obstructive sleep apnea (adult) (pediatric): Secondary | ICD-10-CM | POA: Diagnosis not present

## 2015-11-18 DIAGNOSIS — D122 Benign neoplasm of ascending colon: Secondary | ICD-10-CM | POA: Diagnosis not present

## 2015-11-18 DIAGNOSIS — I4891 Unspecified atrial fibrillation: Secondary | ICD-10-CM | POA: Diagnosis not present

## 2015-11-18 DIAGNOSIS — I10 Essential (primary) hypertension: Secondary | ICD-10-CM | POA: Diagnosis not present

## 2015-11-18 DIAGNOSIS — D123 Benign neoplasm of transverse colon: Secondary | ICD-10-CM | POA: Diagnosis not present

## 2015-11-18 DIAGNOSIS — I5032 Chronic diastolic (congestive) heart failure: Secondary | ICD-10-CM | POA: Diagnosis not present

## 2015-11-18 DIAGNOSIS — J4520 Mild intermittent asthma, uncomplicated: Secondary | ICD-10-CM | POA: Diagnosis not present

## 2015-11-18 DIAGNOSIS — Z0000 Encounter for general adult medical examination without abnormal findings: Secondary | ICD-10-CM | POA: Diagnosis not present

## 2015-12-04 DIAGNOSIS — G4733 Obstructive sleep apnea (adult) (pediatric): Secondary | ICD-10-CM | POA: Diagnosis not present

## 2016-01-01 ENCOUNTER — Other Ambulatory Visit: Payer: Self-pay | Admitting: Cardiology

## 2016-01-03 DIAGNOSIS — G4733 Obstructive sleep apnea (adult) (pediatric): Secondary | ICD-10-CM | POA: Diagnosis not present

## 2016-01-29 ENCOUNTER — Other Ambulatory Visit: Payer: Self-pay | Admitting: Cardiology

## 2016-01-29 MED ORDER — METOPROLOL TARTRATE 25 MG PO TABS: 25.0000 mg | ORAL_TABLET | Freq: Two times a day (BID) | ORAL | Status: DC

## 2016-01-29 MED ORDER — HYDROCHLOROTHIAZIDE 25 MG PO TABS: 25.0000 mg | ORAL_TABLET | Freq: Every day | ORAL | Status: DC

## 2016-01-29 MED ORDER — DILTIAZEM HCL ER COATED BEADS 120 MG PO CP24: 120.0000 mg | ORAL_CAPSULE | Freq: Every day | ORAL | Status: DC

## 2016-02-03 DIAGNOSIS — G4733 Obstructive sleep apnea (adult) (pediatric): Secondary | ICD-10-CM | POA: Diagnosis not present

## 2016-03-04 DIAGNOSIS — G4733 Obstructive sleep apnea (adult) (pediatric): Secondary | ICD-10-CM | POA: Diagnosis not present

## 2016-04-04 DIAGNOSIS — G4733 Obstructive sleep apnea (adult) (pediatric): Secondary | ICD-10-CM | POA: Diagnosis not present

## 2016-04-26 DIAGNOSIS — G4733 Obstructive sleep apnea (adult) (pediatric): Secondary | ICD-10-CM | POA: Diagnosis not present

## 2016-05-05 DIAGNOSIS — G4733 Obstructive sleep apnea (adult) (pediatric): Secondary | ICD-10-CM | POA: Diagnosis not present

## 2016-05-14 DIAGNOSIS — Z23 Encounter for immunization: Secondary | ICD-10-CM | POA: Diagnosis not present

## 2016-05-26 ENCOUNTER — Other Ambulatory Visit: Payer: Self-pay | Admitting: Cardiology

## 2016-06-04 DIAGNOSIS — G4733 Obstructive sleep apnea (adult) (pediatric): Secondary | ICD-10-CM | POA: Diagnosis not present

## 2016-06-17 ENCOUNTER — Telehealth: Payer: Self-pay

## 2016-06-17 NOTE — Telephone Encounter (Signed)
To PA RN for assistance.

## 2016-06-17 NOTE — Telephone Encounter (Signed)
To Dr. Turner for medication recommendations.  

## 2016-06-17 NOTE — Telephone Encounter (Signed)
Check with pharmacy to see if Eliquis is less expensive

## 2016-06-17 NOTE — Telephone Encounter (Signed)
Patient callen in and left message that Xarelto is too expensive starting the first of the year and he would like to be switched to a different medication. His callback number is [phone]

## 2016-06-18 NOTE — Telephone Encounter (Signed)
Left a voicemail message with patient. He can call his Insurance Co and find out which NOAC is preferred, and the cost of it. He can then let us know so Dr. Radford Pax can change it.

## 2016-06-22 DIAGNOSIS — M25552 Pain in left hip: Secondary | ICD-10-CM | POA: Diagnosis not present

## 2016-07-05 DIAGNOSIS — G4733 Obstructive sleep apnea (adult) (pediatric): Secondary | ICD-10-CM | POA: Diagnosis not present

## 2016-07-27 DIAGNOSIS — J45909 Unspecified asthma, uncomplicated: Secondary | ICD-10-CM | POA: Insufficient documentation

## 2016-07-28 DIAGNOSIS — I10 Essential (primary) hypertension: Secondary | ICD-10-CM | POA: Diagnosis not present

## 2016-07-28 DIAGNOSIS — H9190 Unspecified hearing loss, unspecified ear: Secondary | ICD-10-CM | POA: Insufficient documentation

## 2016-07-28 DIAGNOSIS — Z6841 Body Mass Index (BMI) 40.0 and over, adult: Secondary | ICD-10-CM | POA: Diagnosis not present

## 2016-07-28 DIAGNOSIS — J4540 Moderate persistent asthma, uncomplicated: Secondary | ICD-10-CM | POA: Diagnosis not present

## 2016-07-28 DIAGNOSIS — I509 Heart failure, unspecified: Secondary | ICD-10-CM | POA: Diagnosis not present

## 2016-07-28 DIAGNOSIS — I480 Paroxysmal atrial fibrillation: Secondary | ICD-10-CM | POA: Diagnosis not present

## 2016-07-28 DIAGNOSIS — E782 Mixed hyperlipidemia: Secondary | ICD-10-CM | POA: Diagnosis not present

## 2016-08-04 DIAGNOSIS — G4733 Obstructive sleep apnea (adult) (pediatric): Secondary | ICD-10-CM | POA: Diagnosis not present

## 2016-08-29 DIAGNOSIS — J069 Acute upper respiratory infection, unspecified: Secondary | ICD-10-CM | POA: Diagnosis not present

## 2016-09-04 DIAGNOSIS — G4733 Obstructive sleep apnea (adult) (pediatric): Secondary | ICD-10-CM | POA: Diagnosis not present

## 2016-09-09 ENCOUNTER — Telehealth (HOSPITAL_COMMUNITY): Payer: Self-pay | Admitting: Radiology

## 2016-09-09 NOTE — Telephone Encounter (Signed)
Calling schedule echocardiogram

## 2016-09-23 ENCOUNTER — Telehealth: Payer: Self-pay | Admitting: Cardiology

## 2016-09-23 ENCOUNTER — Other Ambulatory Visit: Payer: Self-pay

## 2016-09-23 ENCOUNTER — Ambulatory Visit (HOSPITAL_COMMUNITY): Payer: Medicare HMO | Attending: Cardiovascular Disease

## 2016-09-23 DIAGNOSIS — I501 Left ventricular failure: Secondary | ICD-10-CM | POA: Diagnosis not present

## 2016-09-23 DIAGNOSIS — I77810 Thoracic aortic ectasia: Secondary | ICD-10-CM

## 2016-09-23 NOTE — Telephone Encounter (Signed)
Left message to call back  

## 2016-09-23 NOTE — Telephone Encounter (Signed)
New Message    Pt c/o medication issue:  1. Name of Medication: Xarelto  2. How are you currently taking this medication (dosage and times per day)? 1x day  3. Are you having a reaction (difficulty breathing--STAT)? no  4. What is your medication issue? Pt wants a different medication , he can not afford this medication $250 a month

## 2016-09-24 ENCOUNTER — Telehealth: Payer: Self-pay | Admitting: Cardiology

## 2016-09-24 NOTE — Telephone Encounter (Signed)
New Message    Please call Returning Katy's call about dropping the Xarelto he can not afford it

## 2016-09-24 NOTE — Telephone Encounter (Signed)
Please forward to [facility] to see what options we have including changing to ELiquis

## 2016-09-24 NOTE — Telephone Encounter (Signed)
Patient states that he cannot afford the Xarelto and that he is not going to go back on coumadin. He states that if he has to pay $250 a month that he just will not take it.

## 2016-09-28 NOTE — Telephone Encounter (Signed)
Explained Medicare's deductible and that once met, his copay will go back to normal. He understands he would have the same deductible even if he switches to Eliquis. He refuses to go back to Coumadin, and says be doesn't want to be on Xarelto because he has read information and it "scares the hell out of him."  He states he will call [employer] and learn more about his deductible and call back to discuss medication plan.

## 2016-09-28 NOTE — Telephone Encounter (Signed)
Xarelto is only expensive right now because pt has his beginning of the year deductible to meet with Medicare (typically $400 on most plans). After he pays this, his copay will return back to normal ($35-50 per month on most plans). If pt doesn't want to pay his deductible all at once, he can pick up 1 month instead of 3 month supply of Xarelto at a time to spread out the payment. He would have the same deductible even if he switched to Eliquis.

## 2016-09-29 NOTE — Telephone Encounter (Signed)
Please let patient know that his risk of stroke is high if he is not on any anticoagulation

## 2016-10-19 DIAGNOSIS — I10 Essential (primary) hypertension: Secondary | ICD-10-CM | POA: Diagnosis not present

## 2016-10-19 DIAGNOSIS — E782 Mixed hyperlipidemia: Secondary | ICD-10-CM | POA: Diagnosis not present

## 2016-10-21 DIAGNOSIS — I480 Paroxysmal atrial fibrillation: Secondary | ICD-10-CM | POA: Diagnosis not present

## 2016-10-21 DIAGNOSIS — I110 Hypertensive heart disease with heart failure: Secondary | ICD-10-CM | POA: Diagnosis not present

## 2016-10-21 DIAGNOSIS — J4540 Moderate persistent asthma, uncomplicated: Secondary | ICD-10-CM | POA: Diagnosis not present

## 2016-10-21 DIAGNOSIS — Z6841 Body Mass Index (BMI) 40.0 and over, adult: Secondary | ICD-10-CM | POA: Diagnosis not present

## 2016-10-21 DIAGNOSIS — E782 Mixed hyperlipidemia: Secondary | ICD-10-CM | POA: Diagnosis not present

## 2016-10-21 DIAGNOSIS — I5032 Chronic diastolic (congestive) heart failure: Secondary | ICD-10-CM | POA: Diagnosis not present

## 2016-10-29 DIAGNOSIS — H40013 Open angle with borderline findings, low risk, bilateral: Secondary | ICD-10-CM | POA: Diagnosis not present

## 2016-10-29 DIAGNOSIS — H35033 Hypertensive retinopathy, bilateral: Secondary | ICD-10-CM | POA: Diagnosis not present

## 2016-10-29 DIAGNOSIS — H25013 Cortical age-related cataract, bilateral: Secondary | ICD-10-CM | POA: Diagnosis not present

## 2016-10-29 DIAGNOSIS — H2513 Age-related nuclear cataract, bilateral: Secondary | ICD-10-CM | POA: Diagnosis not present

## 2016-11-04 DIAGNOSIS — J4540 Moderate persistent asthma, uncomplicated: Secondary | ICD-10-CM | POA: Diagnosis not present

## 2016-11-18 ENCOUNTER — Encounter: Payer: Self-pay | Admitting: Cardiology

## 2016-11-18 NOTE — Telephone Encounter (Signed)
This encounter was created in error - please disregard.

## 2016-11-18 NOTE — Telephone Encounter (Signed)
Victor Medina is calling about a letter he received about his test results .Marland Kitchen Please call ..thanks

## 2016-11-18 NOTE — Telephone Encounter (Signed)
Romana Juniper routed conversation to [employer] Triage 19 minutes ago (4:27 PM)    Romana Juniper 20 minutes ago (4:26 PM)     Mr.Danielson is calling about a letter he received about his test results .Marland Kitchen Please call ..thanks       Informed patient of ECHO results and verbal understanding expressed.   Also asked patient what he decided about NOAC. He states he stopped Xarelto and is only taking ASA 81 mg daily. He understands his increased risk of stroke not on NOAC. He states he feels great and doesn't want to add the medication and he can't afford it anyway. He states he will call if his heart goes out of rhythm.

## 2016-11-19 NOTE — Telephone Encounter (Signed)
Please make sure patient understands the increased risk of CVA of anticoagulation. Please find out if he would be willing to take Eliquis

## 2016-11-23 ENCOUNTER — Telehealth: Payer: Self-pay | Admitting: Cardiology

## 2016-11-23 NOTE — Telephone Encounter (Signed)
At time of conversation with patient, he was adamant about not being on NOAC.  He is aware of increased risk of stroke. He stated he would call if he changed his mind.

## 2016-11-23 NOTE — Telephone Encounter (Signed)
New message      Pt states he is feeling lightheaded.   Talk to a nurse to see if he needs to be seen today.

## 2016-11-23 NOTE — Telephone Encounter (Signed)
Called patient and spoke with him about his dizziness. Patient states he feels better and reported BP of 132/76 and another one of 124/74. He stated that he did some strenuous activity a few days ago such as raking leaves which caused his heart rate to increase. He states he has not taken his advair and that his wife is going to the pharmacy to pick it up and that it will make him feel better once he takes it. He denies any dizziness at the moment and says he will call back if he has any symptoms. He thanked me for my call.

## 2016-12-28 DIAGNOSIS — M25551 Pain in right hip: Secondary | ICD-10-CM | POA: Diagnosis not present

## 2017-06-24 DIAGNOSIS — Z23 Encounter for immunization: Secondary | ICD-10-CM | POA: Diagnosis not present

## 2017-06-24 DIAGNOSIS — J4540 Moderate persistent asthma, uncomplicated: Secondary | ICD-10-CM | POA: Diagnosis not present

## 2017-06-24 DIAGNOSIS — I10 Essential (primary) hypertension: Secondary | ICD-10-CM | POA: Diagnosis not present

## 2017-06-24 DIAGNOSIS — E782 Mixed hyperlipidemia: Secondary | ICD-10-CM | POA: Diagnosis not present

## 2017-08-18 DIAGNOSIS — R69 Illness, unspecified: Secondary | ICD-10-CM | POA: Diagnosis not present

## 2017-08-23 DIAGNOSIS — H40013 Open angle with borderline findings, low risk, bilateral: Secondary | ICD-10-CM | POA: Diagnosis not present

## 2017-09-18 ENCOUNTER — Encounter (HOSPITAL_COMMUNITY): Payer: Self-pay | Admitting: *Deleted

## 2017-09-18 ENCOUNTER — Other Ambulatory Visit: Payer: Self-pay

## 2017-09-18 ENCOUNTER — Ambulatory Visit (HOSPITAL_COMMUNITY)
Admission: EM | Admit: 2017-09-18 | Discharge: 2017-09-18 | Disposition: A | Discharge status: Home / Self Care | Payer: Medicare HMO | Attending: Internal Medicine | Admitting: Internal Medicine

## 2017-09-18 DIAGNOSIS — T7840XA Allergy, unspecified, initial encounter: Secondary | ICD-10-CM | POA: Diagnosis not present

## 2017-09-18 MED ORDER — BUPIVACAINE HCL (PF) 0.5 % IJ SOLN
INTRAMUSCULAR | Status: AC
  Filled 2017-09-18: qty 10

## 2017-09-18 MED ORDER — FAMOTIDINE 20 MG PO TABS
ORAL_TABLET | ORAL | Status: AC
  Filled 2017-09-18: qty 1

## 2017-09-18 MED ORDER — DIPHENHYDRAMINE HCL 25 MG PO CAPS
25.0000 mg | ORAL_CAPSULE | Freq: Once | ORAL | Status: AC
  Administered 2017-09-18: 25 mg via ORAL

## 2017-09-18 MED ORDER — METHYLPREDNISOLONE SODIUM SUCC 125 MG IJ SOLR
INTRAMUSCULAR | Status: AC
  Filled 2017-09-18: qty 2

## 2017-09-18 MED ORDER — METHYLPREDNISOLONE SODIUM SUCC 125 MG IJ SOLR
80.0000 mg | Freq: Once | INTRAMUSCULAR | Status: AC
  Administered 2017-09-18: 80 mg via INTRAMUSCULAR

## 2017-09-18 MED ORDER — LIDOCAINE HCL 2 % IJ SOLN
INTRAMUSCULAR | Status: AC
  Filled 2017-09-18: qty 20

## 2017-09-18 MED ORDER — FAMOTIDINE 20 MG PO TABS
20.0000 mg | ORAL_TABLET | Freq: Once | ORAL | Status: AC
  Administered 2017-09-18: 20 mg via ORAL

## 2017-09-18 MED ORDER — DIPHENHYDRAMINE HCL 25 MG PO CAPS
ORAL_CAPSULE | ORAL | Status: AC
  Filled 2017-09-18: qty 1

## 2017-09-18 MED ORDER — FAMOTIDINE 20 MG PO TABS: 20.0000 mg | ORAL_TABLET | Freq: Two times a day (BID) | ORAL | 0 refills | Status: DC

## 2017-09-18 MED ORDER — PREDNISONE 10 MG (21) PO TBPK: ORAL_TABLET | Freq: Every day | ORAL | 0 refills | Status: DC

## 2017-09-18 MED ORDER — DIPHENHYDRAMINE HCL 25 MG PO TABS: 25.0000 mg | ORAL_TABLET | Freq: Four times a day (QID) | ORAL | 0 refills | Status: DC | PRN

## 2017-09-18 NOTE — ED Triage Notes (Signed)
Both eye swelling, started today,

## 2017-09-18 NOTE — Discharge Instructions (Signed)
Please continue with benadryl for the next 48 hours, approximately every 6-8 hours. Twice a day pepcid for the next 5 days. Complete course of steroids. If without improvement at all by tomorrow evening please return to be seen or go to er.  If worsening of symptoms, increased pain, vision change, shortness of breath, difficulty swallowing please go to Er.

## 2017-09-18 NOTE — ED Triage Notes (Signed)
Swollen eyes, both eyes, headaches

## 2017-09-18 NOTE — ED Provider Notes (Signed)
[facility]    CSN: [id number] Arrival date & time: 09/18/17  1334     History   Chief Complaint Chief Complaint  Patient presents with  . Eye Problem  . Headache    HPI Victor Medina is a 72 y.o. male.   Victor Medina presents today with complaints of bilateral eye swelling and pressure to temples and surrounding eyes. This started this morning after breakfast, he ate waffles which he has eaten in the past. Without tearing, itching or discharge. Denies change to vision. Without chest pain, palpitations, cough. Without eye ball pain. No pain with movement of eye balls. Without eye history. No known fevers. Pain is 5/10 and is a pressure to areas of swelling. Is not worsened by light, noise or movement. States he feels he might feel a slight tightness to his throat, but without difficulty swallowing, breathing or chest tightness. Has not taken any medications for symptoms. No known exposures to allergens. States he did have a new syrup with his waffles this morning. History of afib, chf, osa.    ROS per HPI.       Past Medical History:  Diagnosis Date  . Asthma   . Chronic diastolic CHF (congestive heart failure) ([place])   . Morbid obesity ([place])   . OSA (obstructive sleep apnea)   . PAF (paroxysmal atrial fibrillation) ([place]) 08/14/2015   s/p TEE/DCCV to NSR  . RBBB     Patient Active Problem List   Diagnosis Date Noted  . PAF (paroxysmal atrial fibrillation) ([place]) 08/14/2015  . Aortic root dilatation ([place]) 10/26/2013  . Edema of extremities 10/26/2013  . HTN (hypertension) 10/26/2013  . Chronic diastolic CHF (congestive heart failure) ([place])   . RBBB   . Obstructive sleep apnea 08/16/2013  . Morbid obesity ([place]) 08/16/2013  . Rotator cuff disorder 08/16/2013  . Cholecystitis, acute s/p laparoscopic cholecystectomy 12/01/12 12/02/2012    Past Surgical History:  Procedure Laterality Date  . CARDIOVERSION N/A 05/19/2015   Procedure: CARDIOVERSION;  Surgeon:  Fay Records, MD;  Location: [facility] ENDOSCOPY;  Service: Cardiovascular;  Laterality: N/A;  . CHOLECYSTECTOMY N/A 12/01/2012   Procedure: LAPAROSCOPIC CHOLECYSTECTOMY;  Surgeon: Gwenyth Ober, MD;  Location: [place];  Service: General;  Laterality: N/A;  . SHOULDER SURGERY    . TEE WITHOUT CARDIOVERSION N/A 05/19/2015   Procedure: TRANSESOPHAGEAL ECHOCARDIOGRAM (TEE);  Surgeon: Fay Records, MD;  Location: [facility] ENDOSCOPY;  Service: Cardiovascular;  Laterality: N/A;       Home Medications    Prior to Admission medications   Medication Sig Start Date End Date Taking? Authorizing Provider  aspirin EC 81 MG tablet Take 81 mg by mouth daily.   Yes [provider]  atorvastatin (LIPITOR) 40 MG tablet Take 40 mg by mouth daily.   Yes [provider]  beclomethasone (QVAR) 80 MCG/ACT inhaler Inhale 1 puff into the lungs 2 (two) times daily.   Yes [provider]  diltiazem (CARDIZEM CD) 120 MG 24 hr capsule Take 1 capsule (120 mg total) by mouth daily. 01/29/16  Yes Turner, Eber Hong, MD  hydrochlorothiazide (HYDRODIURIL) 25 MG tablet Take 1 tablet (25 mg total) by mouth daily. 01/29/16  Yes Turner, Eber Hong, MD  albuterol (PROVENTIL HFA;VENTOLIN HFA) 108 (90 BASE) MCG/ACT inhaler Inhale 2 puffs into the lungs every 4 (four) hours as needed for wheezing or shortness of breath.    [provider]  Azelastine HCl 0.15 % SOLN Place 2 sprays into the nose daily.  [provider]  Cholecalciferol (VITAMIN D3) 2000 UNITS TABS Take 1,000 Units by mouth daily.     [provider]  diphenhydrAMINE (BENADRYL) 25 MG tablet Take 1 tablet (25 mg total) by mouth every 6 (six) hours as needed for allergies. 09/18/17   Zigmund Gottron, NP  famotidine (PEPCID) 20 MG tablet Take 1 tablet (20 mg total) by mouth 2 (two) times daily. 09/18/17   Zigmund Gottron, NP  fish oil-omega-3 fatty acids 1000 MG capsule Take 2 g by mouth daily.    [provider]  ipratropium  (ATROVENT) 0.03 % nasal spray Place 2 sprays into both nostrils 3 (three) times daily.    [provider]  loratadine (CLARITIN) 10 MG tablet Take 10 mg by mouth daily.    [provider]  metoprolol tartrate (LOPRESSOR) 25 MG tablet Take 1 tablet (25 mg total) by mouth 2 (two) times daily. 01/29/16   Sueanne Margarita, MD  predniSONE (STERAPRED UNI-PAK 21 TAB) 10 MG (21) TBPK tablet Take by mouth daily. Take 6 tabs by mouth daily  for 2 days, then 5 tabs for 2 days, then 4 tabs for 2 days, then 3 tabs for 2 days, 2 tabs for 2 days, then 1 tab by mouth daily for 2 days 09/18/17   Augusto Gamble B, NP  XARELTO 20 MG TABS tablet TAKE 1 TABLET BY MOUTH EVERY DAY WITH SUPPER 05/26/16   Sueanne Margarita, MD    Family History Family History  Problem Relation Age of Onset  . Hypotension Mother     Social History Social History   Tobacco Use  . Smoking status: Former Research scientist (life sciences)  . Smokeless tobacco: Never Used  Substance Use Topics  . Alcohol use: Yes    Alcohol/week: 0.0 oz    Comment: rarely  . Drug use: No     Allergies   Patient has no known allergies.   Review of Systems Review of Systems   Physical Exam Triage Vital Signs ED Triage Vitals  Enc Vitals Group     BP 09/18/17 1543 136/74     Pulse Rate 09/18/17 1543 70     Resp --      Temp 09/18/17 1543 97.6 F (36.4 C)     Temp Source 09/18/17 1543 Oral     SpO2 09/18/17 1543 99 %     Weight --      Height --      Head Circumference --      Peak Flow --      Pain Score 09/18/17 1539 8     Pain Loc --      Pain Edu? --      Excl. in [place]? --    No data found.  Updated Vital Signs BP 136/74 (BP Location: Left Arm)   Pulse 70   Temp 97.6 F (36.4 C) (Oral)   SpO2 99%   Visual Acuity Right Eye Distance: 20/30(corrected) Left Eye Distance: 20/40(corrected) Bilateral Distance:    Right Eye Near:   Left Eye Near:    Bilateral Near:     Physical Exam  Constitutional: He is oriented to person,  place, and time. He appears well-developed and well-nourished.  HENT:  Head: Normocephalic and atraumatic.  Right Ear: Tympanic membrane, external ear and ear canal normal.  Left Ear: Tympanic membrane, external ear and ear canal normal.  Nose: Nose normal. No mucosal edema or rhinorrhea. Right sinus exhibits no maxillary sinus tenderness and no frontal sinus  tenderness. Left sinus exhibits no maxillary sinus tenderness and no frontal sinus tenderness.  Mouth/Throat: Uvula is midline, oropharynx is clear and moist and mucous membranes are normal. No posterior oropharyngeal edema or posterior oropharyngeal erythema.  Eyes: EOM are normal. Pupils are equal, round, and reactive to light. Right conjunctiva is injected. Left conjunctiva is injected.  Bilateral eyes with upper lid swelling; injection to bilateral eyes noted; pupils reactive bilaterally  Neck: Normal range of motion.  Cardiovascular: Normal rate and regular rhythm.  Pulmonary/Chest: Effort normal and breath sounds normal. He has no wheezes.  Lymphadenopathy:    He has no cervical adenopathy.  Neurological: He is alert and oriented to person, place, and time. GCS eye subscore is 4. GCS verbal subscore is 5. GCS motor subscore is 6.  Skin: Skin is warm and dry.  Vitals reviewed.    UC Treatments / Results  Labs (all labs ordered are listed, but only abnormal results are displayed) Labs Reviewed - No data to display  EKG  EKG Interpretation None       Radiology No results found.  Procedures Procedures (including critical care time)  Medications Ordered in UC Medications  famotidine (PEPCID) tablet 20 mg (not administered)  diphenhydrAMINE (BENADRYL) capsule 25 mg (not administered)  methylPREDNISolone sodium succinate (SOLU-MEDROL) 125 mg/2 mL injection 80 mg (not administered)     Initial Impression / Assessment and Plan / UC Course  I have reviewed the triage vital signs and the nursing notes.  Pertinent labs  & imaging results that were available during my care of the patient were reviewed by me and considered in my medical decision making (see chart for details).     Without distress, non toxic in appearance. Without respiratory wheezing, shortness of breath or difficulty swallowing or drinking. Bilateral eye lid swelling and redness. Appears consistent with allergic response at this time. Solumedrol, pepcid and benadryl provided with prednisone course initiated. Return precautions provided and discussed at length. Patient verbalized understanding and agreeable to plan.  Ambulatory out of clinic without difficulty.    Final Clinical Impressions(s) / UC Diagnoses   Final diagnoses:  Allergic reaction, initial encounter    ED Discharge Orders        Ordered    predniSONE (STERAPRED UNI-PAK 21 TAB) 10 MG (21) TBPK tablet  Daily     09/18/17 1641    diphenhydrAMINE (BENADRYL) 25 MG tablet  Every 6 hours PRN     09/18/17 1641    famotidine (PEPCID) 20 MG tablet  2 times daily     09/18/17 1641       Controlled Substance Prescriptions [place] Controlled Substance Registry consulted? Not Applicable   Zigmund Gottron, NP 09/18/17 1645

## 2017-12-23 DIAGNOSIS — S61031A Puncture wound without foreign body of right thumb without damage to nail, initial encounter: Secondary | ICD-10-CM | POA: Diagnosis not present

## 2018-01-13 ENCOUNTER — Other Ambulatory Visit: Payer: Self-pay | Admitting: Family Medicine

## 2018-01-13 ENCOUNTER — Ambulatory Visit
Admission: RE | Admit: 2018-01-13 | Discharge: 2018-01-13 | Disposition: A | Discharge status: Home / Self Care | Payer: Medicare HMO | Source: Ambulatory Visit | Attending: Family Medicine | Admitting: Family Medicine

## 2018-01-13 DIAGNOSIS — Z1211 Encounter for screening for malignant neoplasm of colon: Secondary | ICD-10-CM | POA: Diagnosis not present

## 2018-01-13 DIAGNOSIS — M79644 Pain in right finger(s): Secondary | ICD-10-CM | POA: Diagnosis not present

## 2018-01-13 DIAGNOSIS — M7989 Other specified soft tissue disorders: Secondary | ICD-10-CM | POA: Diagnosis not present

## 2018-01-25 ENCOUNTER — Other Ambulatory Visit: Payer: Self-pay | Admitting: Orthopedic Surgery

## 2018-01-25 DIAGNOSIS — M79644 Pain in right finger(s): Secondary | ICD-10-CM | POA: Diagnosis not present

## 2018-01-26 ENCOUNTER — Telehealth: Payer: Self-pay

## 2018-01-26 NOTE — Telephone Encounter (Signed)
Attempted to reach pt by home number listed on file. There was no answer and the voicemail stated that it was a remote desktop company. Will try again later to see if someone picks up the phone.

## 2018-01-26 NOTE — Telephone Encounter (Signed)
   Primary Cardiologist:Traci Turner, MD  Chart reviewed as part of pre-operative protocol coverage. Because of Victor Medina past medical history and time since last visit, 08/2015, he/she will require a follow-up visit in order to better assess preoperative cardiovascular risk.  Pre-op covering staff: - Please schedule appointment and call patient to inform them. - Please contact requesting surgeon's office via preferred method (i.e, phone, fax) to inform them of need for appointment prior to surgery.  [name], NP  01/26/2018, 2:22 PM

## 2018-01-26 NOTE — Telephone Encounter (Signed)
   [facility] Pre-operative Risk Assessment    Request for surgical clearance:  1. What type of surgery is being performed? RIGHT THUMB MASS EXCISION AND DEBRIDEMENT D.I.P JOINT  2. When is this surgery scheduled? 02/20/18   3. What type of clearance is required (medical clearance vs. Pharmacy clearance to hold med vs. Both)? BOTH  4. Are there any medications that need to be held prior to surgery and how long? NONE LISTED   5. Practice name and name of physician performing surgery? [facility] - DR. KEVIN KUZMA  6. What is your office phone number [phone]    7.   What is your office fax number [phone]  5.   Anesthesia type (None, local, MAC, general) ? NONE LISTED   Victor Medina 01/26/2018, 10:56 AM  _________________________________________________________________   (provider comments below)

## 2018-01-27 DIAGNOSIS — M7062 Trochanteric bursitis, left hip: Secondary | ICD-10-CM | POA: Diagnosis not present

## 2018-01-31 NOTE — Telephone Encounter (Signed)
Follow up    Patient called back to schedule appt for pre op clearance.  Patient upset because scheduler was unable to offer him an appt prior to his scheduled procedure date. Advised patient appt was required.  Patient disconnected the call . No appointment made.

## 2018-01-31 NOTE — Telephone Encounter (Signed)
Left message for pt to call the office back, re: surgical clearance. Pt needs to make an appt before clearance can be given.

## 2018-02-07 ENCOUNTER — Ambulatory Visit
Admission: RE | Admit: 2018-02-07 | Discharge: 2018-02-07 | Disposition: A | Discharge status: Home / Self Care | Payer: Medicare HMO | Source: Ambulatory Visit | Attending: Physician Assistant | Admitting: Physician Assistant

## 2018-02-07 ENCOUNTER — Other Ambulatory Visit: Payer: Self-pay | Admitting: Physician Assistant

## 2018-02-07 DIAGNOSIS — M25511 Pain in right shoulder: Secondary | ICD-10-CM | POA: Diagnosis not present

## 2018-02-07 DIAGNOSIS — M19011 Primary osteoarthritis, right shoulder: Secondary | ICD-10-CM | POA: Diagnosis not present

## 2018-02-07 NOTE — Telephone Encounter (Signed)
Pt is scheduled to see Vin on 02/08/18 . Clearance will be addressed at visit.

## 2018-02-08 ENCOUNTER — Ambulatory Visit: Payer: Medicare HMO | Admitting: Physician Assistant

## 2018-02-08 ENCOUNTER — Encounter: Payer: Self-pay | Admitting: Physician Assistant

## 2018-02-08 VITALS — BP 102/72 | HR 73 | Ht 70.0 in | Wt 355.0 lb

## 2018-02-08 DIAGNOSIS — Z9989 Dependence on other enabling machines and devices: Secondary | ICD-10-CM | POA: Diagnosis not present

## 2018-02-08 DIAGNOSIS — I480 Paroxysmal atrial fibrillation: Secondary | ICD-10-CM

## 2018-02-08 DIAGNOSIS — I5032 Chronic diastolic (congestive) heart failure: Secondary | ICD-10-CM

## 2018-02-08 DIAGNOSIS — R0602 Shortness of breath: Secondary | ICD-10-CM | POA: Diagnosis not present

## 2018-02-08 DIAGNOSIS — Z01818 Encounter for other preprocedural examination: Secondary | ICD-10-CM

## 2018-02-08 DIAGNOSIS — G4733 Obstructive sleep apnea (adult) (pediatric): Secondary | ICD-10-CM | POA: Diagnosis not present

## 2018-02-08 NOTE — Progress Notes (Signed)
Cardiology Office Note    Date:  02/08/2018   ID:  LYNARD POSTLEWAIT, DOB 14-Jan-1946, MRN [id number]  PCP:  Christain Sacramento, MD  Cardiologist:  Dr. Radford Pax   Chief Complaint: surgical clearance for  RIGHT THUMB MASS EXCISION AND DEBRIDEMENT D.I.P JOINT  History of Present Illness:   Victor Medina is a 72 y.o. male with a history of OSA on CPAP and morbid obesity, chronic diastolic CHF with chronic LE edema controlled on diuretics, chronic RBBB, PAF and mildly dilated aortic root (normal on last echo)  presents for surgical clearance.  Last echo 09/2016 showed normal LVF with mild RVE and RAE. Normal aortic root.   He was doing well on cardiac stand point when last seen by Dr. Radford Pax 08/2015.  Here today for surgica clearance.  He took Xarelto for about 1 month when he was diagnosed with atrial fibrillation.  Discontinue since then due to cost.  He no longer wants to have any discussion to restart.  He understands risk of having stroke.  He walks half a mile multiple times per week without chest pain or shortness of breath.  He plans to increase exercise regimen to 2 miles every day.  He has chronic lower extremity edema which is stable.  Denies orthopnea, PND, syncope, dizziness or headache.   Past Medical History:  Diagnosis Date  . Asthma   . Chronic diastolic CHF (congestive heart failure) ([place])   . Morbid obesity ([place])   . OSA (obstructive sleep apnea)   . PAF (paroxysmal atrial fibrillation) ([place]) 08/14/2015   s/p TEE/DCCV to NSR  . RBBB     Past Surgical History:  Procedure Laterality Date  . CARDIOVERSION N/A 05/19/2015   Procedure: CARDIOVERSION;  Surgeon: Fay Records, MD;  Location: [facility] ENDOSCOPY;  Service: Cardiovascular;  Laterality: N/A;  . CHOLECYSTECTOMY N/A 12/01/2012   Procedure: LAPAROSCOPIC CHOLECYSTECTOMY;  Surgeon: Gwenyth Ober, MD;  Location: [place];  Service: General;  Laterality: N/A;  . SHOULDER SURGERY    . TEE WITHOUT CARDIOVERSION N/A 05/19/2015   Procedure: TRANSESOPHAGEAL ECHOCARDIOGRAM (TEE);  Surgeon: Fay Records, MD;  Location: [facility] ENDOSCOPY;  Service: Cardiovascular;  Laterality: N/A;    Current Medications:  Prior to Admission medications   Medication Sig Start Date End Date Taking? Authorizing Provider  albuterol (PROVENTIL HFA;VENTOLIN HFA) 108 (90 BASE) MCG/ACT inhaler Inhale 2 puffs into the lungs every 4 (four) hours as needed for wheezing or shortness of breath.   Yes [provider]  aspirin EC 81 MG tablet Take 81 mg by mouth daily.   Yes [provider]  atorvastatin (LIPITOR) 40 MG tablet Take 40 mg by mouth daily.   Yes [provider]  Cholecalciferol (VITAMIN D3) 2000 UNITS TABS Take 1,000 Units by mouth daily.    Yes [provider]  diltiazem (CARDIZEM CD) 120 MG 24 hr capsule Take 1 capsule (120 mg total) by mouth daily. 01/29/16  Yes Turner, Eber Hong, MD  fish oil-omega-3 fatty acids 1000 MG capsule Take 2 g by mouth daily.   Yes [provider]  Fluticasone-Salmeterol (ADVAIR) 250-50 MCG/DOSE AEPB Inhale 1 puff into the lungs 2 (two) times daily. 12/26/17  Yes [provider]  hydrochlorothiazide (HYDRODIURIL) 25 MG tablet Take 1 tablet (25 mg total) by mouth daily. 01/29/16  Yes Turner, Eber Hong, MD  loratadine (CLARITIN) 10 MG tablet Take 10 mg by mouth daily.   Yes [provider]  meloxicam (MOBIC) 15 MG tablet Take  1 tablet by mouth daily. 02/07/18  Yes [provider]  metoprolol tartrate (LOPRESSOR) 25 MG tablet Take 1 tablet (25 mg total) by mouth 2 (two) times daily. 01/29/16  Yes Sueanne Margarita, MD   Allergies:   Patient has no known allergies.   Social History   Socioeconomic History  . Marital status: Married    Spouse name: Not on file  . Number of children: Not on file  . Years of education: Not on file  . Highest education level: Not on file  Occupational History  . Not on file  Social Needs  . Financial resource strain: Not  on file  . Food insecurity:    Worry: Not on file    Inability: Not on file  . Transportation needs:    Medical: Not on file    Non-medical: Not on file  Tobacco Use  . Smoking status: Former Research scientist (life sciences)  . Smokeless tobacco: Never Used  Substance and Sexual Activity  . Alcohol use: Yes    Alcohol/week: 0.0 oz    Comment: rarely  . Drug use: No  . Sexual activity: Never  Lifestyle  . Physical activity:    Days per week: Not on file    Minutes per session: Not on file  . Stress: Not on file  Relationships  . Social connections:    Talks on phone: Not on file    Gets together: Not on file    Attends religious service: Not on file    Active member of club or organization: Not on file    Attends meetings of clubs or organizations: Not on file    Relationship status: Not on file  Other Topics Concern  . Not on file  Social History Narrative  . Not on file     Family History:  The patient's family history includes Hypotension in his mother.   ROS:   Please see the history of present illness.    ROS All other systems reviewed and are negative.   PHYSICAL EXAM:   VS:  BP 102/72   Pulse 73   Ht 5\' 10"  (1.778 m)   Wt (!) 355 lb (161 kg)   SpO2 98%   BMI 50.94 kg/m    GEN: Obese male in no acute distress  HEENT: normal  Neck: no JVD, carotid bruits, or masses Cardiac: RRR; no murmurs, rubs, or gallops, trace bilateral lower extremity edema  Respiratory:  clear to auscultation bilaterally, normal work of breathing GI: soft, nontender, nondistended, + BS MS: no deformity or atrophy  Skin: warm and dry, no rash Neuro:  Alert and Oriented x 3, Strength and sensation are intact Psych: euthymic mood, full affect  Wt Readings from Last 3 Encounters:  02/08/18 (!) 355 lb (161 kg)  08/14/15 (!) 378 lb (171.5 kg)  06/03/15 (!) 352 lb 12.8 oz (160 kg)      Studies/Labs Reviewed:   EKG:  EKG is ordered today.  The ekg ordered today demonstrates sinus rhythm at rate of 73 bpm  and chronic right bundle branch block with LVH criteria  Recent Labs: No results found for requested labs within last 8760 hours.   Lipid Panel No results found for: CHOL, TRIG, HDL, CHOLHDL, VLDL, LDLCALC, LDLDIRECT  Additional studies/ records that were reviewed today include:   Echocardiogram: 09/2016 ------------------------------------------------------------------- Study Conclusions  - Left ventricle: The cavity size was normal. Wall thickness was   normal. Systolic function was normal. The estimated ejection   fraction was  in the range of 60% to 65%. Wall motion was normal;   there were no regional wall motion abnormalities. Doppler   parameters are consistent with abnormal left ventricular   relaxation (grade 1 diastolic dysfunction). - Right ventricle: The cavity size was mildly dilated. - Right atrium: The atrium was moderately dilated.      ASSESSMENT & PLAN:    1. Paroxysmal atrial fibrillation CHADSVASC score of 2. Took Xarelto only for one month after initial diagnosis. No longer wants to have discussion regarding anticoagulation.  He understands risk of stroke.  Sinus rhythm by EKG.  Continue Cardizem.  2.  Obstructive sleep apnea -Compliant with CPAP.  3.  Chronic diastolic dysfunction with lower extremity edema -Stable.  Continue hydrochlorothiazide.  No orthopnea or PND.  4.  Morbid obesity -He is planning to increase his exercise.  5.  Surgical clearance -He is able to get 6.05 minutes of activity per [facility] activity status index.  He has no symptoms concerning for angina or CHF.  Currently he is not on any anticoagulation or antiplatelet therapy.  Off aspirin because need of NSAIDs for left shoulder pain.  Given past medical history and based on ACC/AHA guidelines, ZAYVIEN CANNING would be at acceptable risk for the planned procedure without further cardiovascular testing.   I will route this recommendation to the requesting party via Epic fax  function and remove from pre-op pool.  Please call with questions.  [place], [place] 02/08/2018, 9:48 AM      Medication Adjustments/Labs and Tests Ordered: Current medicines are reviewed at length with the patient today.  Concerns regarding medicines are outlined above.  Medication changes, Labs and Tests ordered today are listed in the Patient Instructions below. Patient Instructions  Medication Instructions:  Your physician recommends that you continue on your current medications as directed. Please refer to the Current Medication list given to you today.  Labwork: NONE  Testing/Procedures: NONE  Follow-Up: Your physician wants you to follow-up in: 6 months with Dr. Radford Pax. You will receive a reminder letter in the mail two months in advance. If you don't receive a letter, please call our office to schedule the follow-up appointment.   If you need a refill on your cardiac medications before your next appointment, please call your pharmacy.       Jarrett Soho, [place]  02/08/2018 9:43 AM    [facility] [address], [place], [place]  [address] Phone: [phone]; Fax: [phone]

## 2018-02-08 NOTE — Patient Instructions (Signed)
Medication Instructions:  Your physician recommends that you continue on your current medications as directed. Please refer to the Current Medication list given to you today.  Labwork: NONE  Testing/Procedures: NONE  Follow-Up: Your physician wants you to follow-up in: 6 months with Dr. Radford Pax. You will receive a reminder letter in the mail two months in advance. If you don't receive a letter, please call our office to schedule the follow-up appointment.   If you need a refill on your cardiac medications before your next appointment, please call your pharmacy.

## 2018-02-11 DIAGNOSIS — M25511 Pain in right shoulder: Secondary | ICD-10-CM | POA: Diagnosis not present

## 2018-02-14 ENCOUNTER — Encounter (HOSPITAL_BASED_OUTPATIENT_CLINIC_OR_DEPARTMENT_OTHER): Payer: Self-pay | Admitting: *Deleted

## 2018-02-14 ENCOUNTER — Other Ambulatory Visit: Payer: Self-pay

## 2018-02-14 NOTE — Progress Notes (Signed)
Reviewed pt chart with Dr. Gifford Shave, discussed BMI and medical history.  Due to type of surgery and probability of mac with digital block, okay given to bring pt in for preop anesthesia consult and scheduled surgery

## 2018-02-15 ENCOUNTER — Encounter (HOSPITAL_BASED_OUTPATIENT_CLINIC_OR_DEPARTMENT_OTHER)
Admission: RE | Admit: 2018-02-15 | Discharge: 2018-02-15 | Disposition: A | Discharge status: Home / Self Care | Payer: Medicare HMO | Source: Ambulatory Visit | Attending: Orthopedic Surgery | Admitting: Orthopedic Surgery

## 2018-02-15 DIAGNOSIS — Z01818 Encounter for other preprocedural examination: Secondary | ICD-10-CM | POA: Insufficient documentation

## 2018-02-15 DIAGNOSIS — Z01812 Encounter for preprocedural laboratory examination: Secondary | ICD-10-CM | POA: Insufficient documentation

## 2018-02-15 LAB — BASIC METABOLIC PANEL
Anion gap: 8 (ref 5–15)
BUN: 27 mg/dL — AB (ref 8–23)
CO2: 27 mmol/L (ref 22–32)
Calcium: 9 mg/dL (ref 8.9–10.3)
Chloride: 105 mmol/L (ref 98–111)
Creatinine, Ser: 1.21 mg/dL (ref 0.61–1.24)
GFR calc Af Amer: >60 mL/min (ref >60)
GFR calc non Af Amer: 58 mL/min — ABNORMAL LOW (ref >60)
GLUCOSE: 132 mg/dL — AB (ref 70–99)
POTASSIUM: 3.5 mmol/L (ref 3.5–5.1)
Sodium: 140 mmol/L (ref 135–145)

## 2018-02-15 NOTE — Progress Notes (Signed)
Anesthesia consult per Dr. Turk, will proceed with surgery as scheduled.  

## 2018-02-20 ENCOUNTER — Ambulatory Visit (HOSPITAL_BASED_OUTPATIENT_CLINIC_OR_DEPARTMENT_OTHER): Payer: Medicare HMO | Admitting: Anesthesiology

## 2018-02-20 ENCOUNTER — Other Ambulatory Visit: Payer: Self-pay

## 2018-02-20 ENCOUNTER — Encounter (HOSPITAL_BASED_OUTPATIENT_CLINIC_OR_DEPARTMENT_OTHER): Payer: Self-pay | Admitting: Anesthesiology

## 2018-02-20 ENCOUNTER — Ambulatory Visit (HOSPITAL_BASED_OUTPATIENT_CLINIC_OR_DEPARTMENT_OTHER)
Admit: 2018-02-20 | Discharge: 2018-02-20 | Disposition: A | Discharge status: Home / Self Care | Payer: Medicare HMO | Attending: Orthopedic Surgery | Admitting: Orthopedic Surgery

## 2018-02-20 ENCOUNTER — Encounter (HOSPITAL_BASED_OUTPATIENT_CLINIC_OR_DEPARTMENT_OTHER): Disposition: A | Discharge status: Home / Self Care | Payer: Self-pay | Attending: Orthopedic Surgery

## 2018-02-20 DIAGNOSIS — Z7951 Long term (current) use of inhaled steroids: Secondary | ICD-10-CM | POA: Insufficient documentation

## 2018-02-20 DIAGNOSIS — Z85828 Personal history of other malignant neoplasm of skin: Secondary | ICD-10-CM | POA: Insufficient documentation

## 2018-02-20 DIAGNOSIS — M1811 Unilateral primary osteoarthritis of first carpometacarpal joint, right hand: Secondary | ICD-10-CM | POA: Diagnosis not present

## 2018-02-20 DIAGNOSIS — I110 Hypertensive heart disease with heart failure: Secondary | ICD-10-CM | POA: Diagnosis not present

## 2018-02-20 DIAGNOSIS — M659 Synovitis and tenosynovitis, unspecified: Secondary | ICD-10-CM | POA: Insufficient documentation

## 2018-02-20 DIAGNOSIS — I5032 Chronic diastolic (congestive) heart failure: Secondary | ICD-10-CM | POA: Insufficient documentation

## 2018-02-20 DIAGNOSIS — Z791 Long term (current) use of non-steroidal anti-inflammatories (NSAID): Secondary | ICD-10-CM | POA: Insufficient documentation

## 2018-02-20 DIAGNOSIS — Z87891 Personal history of nicotine dependence: Secondary | ICD-10-CM | POA: Diagnosis not present

## 2018-02-20 DIAGNOSIS — I480 Paroxysmal atrial fibrillation: Secondary | ICD-10-CM | POA: Diagnosis not present

## 2018-02-20 DIAGNOSIS — Z9989 Dependence on other enabling machines and devices: Secondary | ICD-10-CM | POA: Insufficient documentation

## 2018-02-20 DIAGNOSIS — R2231 Localized swelling, mass and lump, right upper limb: Secondary | ICD-10-CM | POA: Diagnosis present

## 2018-02-20 DIAGNOSIS — Z7982 Long term (current) use of aspirin: Secondary | ICD-10-CM | POA: Insufficient documentation

## 2018-02-20 DIAGNOSIS — M71341 Other bursal cyst, right hand: Secondary | ICD-10-CM | POA: Diagnosis not present

## 2018-02-20 DIAGNOSIS — G4733 Obstructive sleep apnea (adult) (pediatric): Secondary | ICD-10-CM | POA: Diagnosis not present

## 2018-02-20 DIAGNOSIS — J45909 Unspecified asthma, uncomplicated: Secondary | ICD-10-CM | POA: Diagnosis not present

## 2018-02-20 DIAGNOSIS — M67441 Ganglion, right hand: Secondary | ICD-10-CM | POA: Diagnosis not present

## 2018-02-20 DIAGNOSIS — Z79899 Other long term (current) drug therapy: Secondary | ICD-10-CM | POA: Diagnosis not present

## 2018-02-20 DIAGNOSIS — I739 Peripheral vascular disease, unspecified: Secondary | ICD-10-CM | POA: Insufficient documentation

## 2018-02-20 DIAGNOSIS — M19041 Primary osteoarthritis, right hand: Secondary | ICD-10-CM | POA: Diagnosis not present

## 2018-02-20 HISTORY — PX: MASS EXCISION: SHX2000

## 2018-02-20 HISTORY — DX: Malignant (primary) neoplasm, unspecified: C80.1

## 2018-02-20 SURGERY — EXCISION MASS
Anesthesia: Monitor Anesthesia Care | Site: Thumb | Laterality: Right

## 2018-02-20 MED ORDER — CHLORHEXIDINE GLUCONATE 4 % EX LIQD: 60.0000 mL | Freq: Once | CUTANEOUS | Status: DC

## 2018-02-20 MED ORDER — PROPOFOL 500 MG/50ML IV EMUL
INTRAVENOUS | Status: DC | PRN
  Administered 2018-02-20: 35 ug/kg/min via INTRAVENOUS

## 2018-02-20 MED ORDER — LACTATED RINGERS IV SOLN
INTRAVENOUS | Status: DC
  Administered 2018-02-20: 13:00:00 via INTRAVENOUS

## 2018-02-20 MED ORDER — LIDOCAINE HCL (PF) 1 % IJ SOLN
INTRAMUSCULAR | Status: DC | PRN
Start: 2018-02-20 — End: 2018-02-20
  Administered 2018-02-20: 10 mL

## 2018-02-20 MED ORDER — HYDROCODONE-ACETAMINOPHEN 7.5-325 MG PO TABS: 1.0000 | ORAL_TABLET | Freq: Once | ORAL | Status: DC | PRN

## 2018-02-20 MED ORDER — FENTANYL CITRATE (PF) 100 MCG/2ML IJ SOLN
50.0000 ug | INTRAMUSCULAR | Status: DC | PRN
  Administered 2018-02-20: 50 ug via INTRAVENOUS

## 2018-02-20 MED ORDER — MIDAZOLAM HCL 2 MG/2ML IJ SOLN
INTRAMUSCULAR | Status: AC
  Filled 2018-02-20: qty 2

## 2018-02-20 MED ORDER — FENTANYL CITRATE (PF) 100 MCG/2ML IJ SOLN
INTRAMUSCULAR | Status: AC
  Filled 2018-02-20: qty 2

## 2018-02-20 MED ORDER — MEPERIDINE HCL 25 MG/ML IJ SOLN: 6.2500 mg | INTRAMUSCULAR | Status: DC | PRN

## 2018-02-20 MED ORDER — MIDAZOLAM HCL 2 MG/2ML IJ SOLN
1.0000 mg | INTRAMUSCULAR | Status: DC | PRN
  Administered 2018-02-20: 1 mg via INTRAVENOUS

## 2018-02-20 MED ORDER — SCOPOLAMINE 1 MG/3DAYS TD PT72: 1.0000 | MEDICATED_PATCH | Freq: Once | TRANSDERMAL | Status: DC | PRN

## 2018-02-20 MED ORDER — CEFAZOLIN SODIUM-DEXTROSE 1-4 GM/50ML-% IV SOLN
INTRAVENOUS | Status: AC
  Filled 2018-02-20: qty 50

## 2018-02-20 MED ORDER — CEFAZOLIN SODIUM-DEXTROSE 2-4 GM/100ML-% IV SOLN
INTRAVENOUS | Status: AC
  Filled 2018-02-20: qty 100

## 2018-02-20 MED ORDER — BUPIVACAINE-EPINEPHRINE (PF) 0.5% -1:200000 IJ SOLN
INTRAMUSCULAR | Status: DC | PRN
  Administered 2018-02-20: 30 mL via PERINEURAL

## 2018-02-20 MED ORDER — FENTANYL CITRATE (PF) 100 MCG/2ML IJ SOLN: 25.0000 ug | INTRAMUSCULAR | Status: DC | PRN

## 2018-02-20 MED ORDER — HYDROCODONE-ACETAMINOPHEN 5-325 MG PO TABS: ORAL_TABLET | ORAL | 0 refills | Status: DC

## 2018-02-20 MED ORDER — CEFAZOLIN SODIUM-DEXTROSE 2-4 GM/100ML-% IV SOLN
2.0000 g | INTRAVENOUS | Status: AC
  Administered 2018-02-20: 2 g via INTRAVENOUS

## 2018-02-20 MED ORDER — LIDOCAINE HCL (CARDIAC) PF 100 MG/5ML IV SOSY
PREFILLED_SYRINGE | INTRAVENOUS | Status: DC | PRN
  Administered 2018-02-20: 30 mg via INTRAVENOUS

## 2018-02-20 MED ORDER — ONDANSETRON HCL 4 MG/2ML IJ SOLN: 4.0000 mg | Freq: Once | INTRAMUSCULAR | Status: DC | PRN

## 2018-02-20 SURGICAL SUPPLY — 56 items
APL SKNCLS STERI-STRIP NONHPOA (GAUZE/BANDAGES/DRESSINGS)
BANDAGE ACE 3X5.8 VEL STRL LF (GAUZE/BANDAGES/DRESSINGS) IMPLANT
BANDAGE COBAN STERILE 2 (GAUZE/BANDAGES/DRESSINGS) IMPLANT
BENZOIN TINCTURE PRP APPL 2/3 (GAUZE/BANDAGES/DRESSINGS) IMPLANT
BLADE MINI RND TIP GREEN BEAV (BLADE) IMPLANT
BLADE SURG 15 STRL LF DISP TIS (BLADE) ×2 IMPLANT
BLADE SURG 15 STRL SS (BLADE) ×4
BNDG CMPR 9X4 STRL LF SNTH (GAUZE/BANDAGES/DRESSINGS)
BNDG COHESIVE 1X5 TAN STRL LF (GAUZE/BANDAGES/DRESSINGS) IMPLANT
BNDG CONFORM 2 STRL LF (GAUZE/BANDAGES/DRESSINGS) IMPLANT
BNDG ELASTIC 2X5.8 VLCR STR LF (GAUZE/BANDAGES/DRESSINGS) IMPLANT
BNDG ESMARK 4X9 LF (GAUZE/BANDAGES/DRESSINGS) IMPLANT
BNDG GAUZE 1X2.1 STRL (MISCELLANEOUS) IMPLANT
BNDG GAUZE ELAST 4 BULKY (GAUZE/BANDAGES/DRESSINGS) IMPLANT
BNDG PLASTER X FAST 3X3 WHT LF (CAST SUPPLIES) IMPLANT
BNDG PLSTR 9X3 FST ST WHT (CAST SUPPLIES)
CHLORAPREP W/TINT 26ML (MISCELLANEOUS) ×2 IMPLANT
CORD BIPOLAR FORCEPS 12FT (ELECTRODE) ×2 IMPLANT
COVER BACK TABLE 60X90IN (DRAPES) ×2 IMPLANT
COVER MAYO STAND STRL (DRAPES) ×2 IMPLANT
CUFF TOURNIQUET SINGLE 18IN (TOURNIQUET CUFF) IMPLANT
DRAPE EXTREMITY T 121X128X90 (DRAPE) ×2 IMPLANT
DRAPE SURG 17X23 STRL (DRAPES) ×2 IMPLANT
GAUZE SPONGE 4X4 12PLY STRL (GAUZE/BANDAGES/DRESSINGS) ×2 IMPLANT
GAUZE XEROFORM 1X8 LF (GAUZE/BANDAGES/DRESSINGS) ×2 IMPLANT
GLOVE BIO SURGEON STRL SZ7.5 (GLOVE) ×2 IMPLANT
GLOVE BIOGEL PI IND STRL 7.0 (GLOVE) ×1 IMPLANT
GLOVE BIOGEL PI IND STRL 8 (GLOVE) ×1 IMPLANT
GLOVE BIOGEL PI INDICATOR 7.0 (GLOVE) ×1
GLOVE BIOGEL PI INDICATOR 8 (GLOVE) ×1
GLOVE ECLIPSE 6.5 STRL STRAW (GLOVE) ×1 IMPLANT
GOWN STRL REUS W/ TWL LRG LVL3 (GOWN DISPOSABLE) ×1 IMPLANT
GOWN STRL REUS W/TWL LRG LVL3 (GOWN DISPOSABLE) ×2
GOWN STRL REUS W/TWL XL LVL3 (GOWN DISPOSABLE) ×2 IMPLANT
NEEDLE HYPO 25X1 1.5 SAFETY (NEEDLE) ×2 IMPLANT
NS IRRIG 1000ML POUR BTL (IV SOLUTION) ×2 IMPLANT
PACK BASIN DAY SURGERY FS (CUSTOM PROCEDURE TRAY) ×2 IMPLANT
PAD CAST 3X4 CTTN HI CHSV (CAST SUPPLIES) IMPLANT
PAD CAST 4YDX4 CTTN HI CHSV (CAST SUPPLIES) IMPLANT
PADDING CAST ABS 4INX4YD NS (CAST SUPPLIES) ×1
PADDING CAST ABS COTTON 4X4 ST (CAST SUPPLIES) ×1 IMPLANT
PADDING CAST COTTON 3X4 STRL (CAST SUPPLIES)
PADDING CAST COTTON 4X4 STRL (CAST SUPPLIES)
SLING ARM FOAM STRAP XLG (SOFTGOODS) ×2 IMPLANT
SPLINT FINGER 3.25 BULB 911905 (SOFTGOODS) ×1 IMPLANT
STOCKINETTE 4X48 STRL (DRAPES) ×2 IMPLANT
STRIP CLOSURE SKIN 1/2X4 (GAUZE/BANDAGES/DRESSINGS) IMPLANT
SUT ETHILON 3 0 PS 1 (SUTURE) IMPLANT
SUT ETHILON 4 0 PS 2 18 (SUTURE) ×2 IMPLANT
SUT ETHILON 5 0 P 3 18 (SUTURE)
SUT NYLON ETHILON 5-0 P-3 1X18 (SUTURE) IMPLANT
SUT VIC AB 4-0 P2 18 (SUTURE) IMPLANT
SYR BULB 3OZ (MISCELLANEOUS) ×2 IMPLANT
SYR CONTROL 10ML LL (SYRINGE) ×2 IMPLANT
TOWEL GREEN STERILE FF (TOWEL DISPOSABLE) ×4 IMPLANT
UNDERPAD 30X30 (UNDERPADS AND DIAPERS) ×2 IMPLANT

## 2018-02-20 NOTE — Op Note (Signed)
NAME: Victor Medina MEDICAL RECORD NO: [id number] DATE OF BIRTH: 1946/03/29 FACILITY: [facility] LOCATION: [facility] PHYSICIAN: Tennis Must, MD   OPERATIVE REPORT   DATE OF PROCEDURE: 02/20/18   PREOPERATIVE DIAGNOSIS:   Right thumb mucoid cyst and IP joint arthritis   POSTOPERATIVE DIAGNOSIS:   Right thumb mucoid cyst and IP joint arthritis   PROCEDURE:   1.  Right thumb excision of mucoid cyst 2.  Right thumb debridement IP joint   SURGEON:  Leanora Cover, M.D.   ASSISTANT: none   ANESTHESIA:  Regional with sedation   INTRAVENOUS FLUIDS:  Per anesthesia flow sheet.   ESTIMATED BLOOD LOSS:  Minimal.   COMPLICATIONS:  None.   SPECIMENS:   Right thumb mass to pathology   TOURNIQUET TIME:    Total Tourniquet Time Documented: Upper Arm (Right) - 22 minutes Total: Upper Arm (Right) - 22 minutes    DISPOSITION:  Stable to PACU.   INDICATIONS: 72 year old male who is noted a mass in the right thumb.  This is bothersome to him.  He wishes to have it excised in the IP joint debrided to try prevent recurrence. Risks, benefits and alternatives of surgery were discussed including the risks of blood loss, infection, damage to nerves, vessels, tendons, ligaments, bone for surgery, need for additional surgery, complications with wound healing, continued pain, nonunion, malunion, stiffness.  He voiced understanding of these risks and elected to proceed.  OPERATIVE COURSE:  After being identified preoperatively by myself,  the patient and I agreed on the procedure and site of the procedure.  The surgical site was marked.  Surgical consent had been signed. He was given IV Ancef as preoperative antibiotic prophylaxis. He was transferred to the operating room and placed on the operating table in supine position with the Right upper extremity on an arm board.  Sedation was induced by the anesthesiologist. A regional block had been performed by anesthesia in preoperative  holding.   Right upper extremity was prepped and draped in normal sterile orthopedic fashion.  A surgical pause was performed between the surgeons, anesthesia, and operating room staff and all were in agreement as to the patient, procedure, and site of procedure.  Tourniquet at the proximal aspect of the extremity was inflated to 250 mmHg after exsanguination of the arm with an Esmarch bandage.    A hockey-stick shaped incision was made over the IP joint of the thumb near the mass.  A digital block was performed with quarter percent plain Marcaine to aid in operative anesthesia.  The incision was carried in subtenons tissues by spreading technique.  The mass was identified at the ulnar side of the IP joint.  This appeared to be coming from the dorsal ulnar aspect of the IP joint.  It was removed and sent to pathology for examination.  The IP joint was entered underneath the extensor tendon and debrided.  There were no distinct osteophytes.  The synovium was debrided.  The skin was placed back over top and the area palpated.  No probable mass was remaining.  The wound was copiously irrigated with sterile saline area it was then closed with 4-0 nylon in a horizontal mattress fashion.  The wound was dressed with sterile Xeroform 4 x 4's and wrapped with a Coban dressing lightly.  An AlumaFoam splint was placed and wrapped lightly with Coban dressing.  The tourniquet was deflated at 22 minutes.  Fingertips were pink with brisk capillary refill after deflation of tourniquet.  The operative  drapes were broken down.  The patient was awoken from anesthesia safely.  He was transferred back to the stretcher and taken to PACU in stable condition.  I will see him back in the office in 1 week for postoperative followup.  I will give him a prescription for Norco 5/325 1-2 tabs PO q6 hours prn pain, dispense # 10.   Tennis Must, MD Electronically signed, 02/20/18

## 2018-02-20 NOTE — Anesthesia Procedure Notes (Signed)
Procedure Name: MAC Date/Time: 02/20/2018 2:13 PM Performed by: Signe Colt, CRNA Pre-anesthesia Checklist: Patient identified, Emergency Drugs available, Suction available, Patient being monitored and Timeout performed Patient Re-evaluated:Patient Re-evaluated prior to induction Oxygen Delivery Method: Simple face mask

## 2018-02-20 NOTE — Progress Notes (Signed)
Assisted Dr. Foster with right, ultrasound guided, supraclavicular block. Side rails up, monitors on throughout procedure. See vital signs in flow sheet. Tolerated Procedure well. °

## 2018-02-20 NOTE — Anesthesia Procedure Notes (Signed)
Anesthesia Regional Block: Axillary brachial plexus block   Pre-Anesthetic Checklist: ,, timeout performed, Correct Patient, Correct Site, Correct Laterality, Correct Procedure, Correct Position, site marked, Risks and benefits discussed,  Surgical consent,  Pre-op evaluation,  At surgeon's request and post-op pain management  Laterality: Right  Prep: chloraprep       Needles:  Injection technique: Single-shot  Needle Type: Echogenic Stimulator Needle     Needle Length: 9cm  Needle Gauge: 21   Needle insertion depth: 6 cm   Additional Needles:   Procedures:,,,, ultrasound used (permanent image in chart),,,,  Narrative:  Start time: 02/20/2018 12:57 PM End time: 02/20/2018 1:02 PM Injection made incrementally with aspirations every 5 mL.  Performed by: Personally  Anesthesiologist: Josephine Igo, MD  Additional Notes: Timeout performed. Patient sedated. Relevant anatomy ID'd using [place]. Incremental 2-43ml injection of LA with frequent aspiration. Patient tolerated procedure well.        Right axillary block

## 2018-02-20 NOTE — Anesthesia Preprocedure Evaluation (Signed)
Anesthesia Evaluation  Patient identified by MRN, date of birth, ID band Patient awake    Reviewed: Allergy & Precautions, NPO status , Patient's Chart, lab work & pertinent test results, reviewed documented beta blocker date and time   Airway Mallampati: II  TM Distance: >3 FB Neck ROM: Full    Dental no notable dental hx. (+) Teeth Intact   Pulmonary asthma , sleep apnea and Continuous Positive Airway Pressure Ventilation , former smoker,    Pulmonary exam normal breath sounds clear to auscultation       Cardiovascular hypertension, Pt. on medications and Pt. on home beta blockers + Peripheral Vascular Disease and +CHF  Normal cardiovascular exam+ dysrhythmias  Rhythm:Regular Rate:Normal     Neuro/Psych negative neurological ROS  negative psych ROS   GI/Hepatic negative GI ROS, Neg liver ROS,   Endo/Other  Morbid obesityHyperlipidemia  Renal/GU negative Renal ROS  negative genitourinary   Musculoskeletal Right thumb mucoid cyst DIP joint arthritis   Abdominal (+) + obese,   Peds  Hematology negative hematology ROS (+)   Anesthesia Other Findings   Reproductive/Obstetrics                             Anesthesia Physical Anesthesia Plan  ASA: III  Anesthesia Plan: MAC and Regional   Post-op Pain Management:  Regional for Post-op pain   Induction:   PONV Risk Score and Plan: 1 and Ondansetron and Treatment may vary due to age or medical condition  Airway Management Planned: Natural Airway and Nasal Cannula  Additional Equipment:   Intra-op Plan:   Post-operative Plan:   Informed Consent: I have reviewed the patients History and Physical, chart, labs and discussed the procedure including the risks, benefits and alternatives for the proposed anesthesia with the patient or authorized representative who has indicated his/her understanding and acceptance.   Dental advisory  given  Plan Discussed with: CRNA and Surgeon  Anesthesia Plan Comments:         Anesthesia Quick Evaluation

## 2018-02-20 NOTE — Anesthesia Postprocedure Evaluation (Signed)
Anesthesia Post Note  Patient: Victor Medina  Procedure(s) Performed: RIGHT THUMB EXCISION MASS AND DEBRIDEMENT DISTAL INTRPHLANGEAL JOINT (Right Thumb)     Patient location during evaluation: PACU Anesthesia Type: Regional and MAC Level of consciousness: awake and alert and oriented Pain management: pain level controlled Vital Signs Assessment: post-procedure vital signs reviewed and stable Respiratory status: spontaneous breathing, nonlabored ventilation and respiratory function stable Cardiovascular status: blood pressure returned to baseline and stable Postop Assessment: no apparent nausea or vomiting Anesthetic complications: no    Last Vitals:  Vitals:   02/20/18 1500 02/20/18 1515  BP: 129/74 125/73  Pulse: 68 (!) 59  Resp: 11 15  Temp:    SpO2: 96% 95%    Last Pain:  Vitals:   02/20/18 1446  TempSrc:   PainSc: 1                  Raiana Pharris A.

## 2018-02-20 NOTE — Transfer of Care (Signed)
Immediate Anesthesia Transfer of Care Note  Patient: Victor Medina  Procedure(s) Performed: RIGHT THUMB EXCISION MASS AND DEBRIDEMENT DISTAL INTRPHLANGEAL JOINT (Right Thumb)  Patient Location: PACU  Anesthesia Type:MAC combined with regional for post-op pain  Level of Consciousness: awake, alert , oriented and patient cooperative  Airway & Oxygen Therapy: Patient Spontanous Breathing and Patient connected to face mask oxygen  Post-op Assessment: Report given to RN and Post -op Vital signs reviewed and stable  Post vital signs: Reviewed and stable  Last Vitals:  Vitals Value Taken Time  BP    Temp    Pulse 73 02/20/2018  2:46 PM  Resp    SpO2 97 % 02/20/2018  2:46 PM  Vitals shown include unvalidated device data.  Last Pain:  Vitals:   02/20/18 1227  TempSrc: Oral  PainSc: 0-No pain         Complications: No apparent anesthesia complications

## 2018-02-20 NOTE — Discharge Instructions (Signed)

## 2018-02-20 NOTE — H&P (Signed)
Victor Medina is an 72 y.o. male.   Chief Complaint: right thumb mass HPI: 72 yo male with mass right thumb.  This is bothersome to him and he wishes to have it removed and the IP joint debrided to try to prevent recurrence.  Allergies: No Known Allergies  Past Medical History:  Diagnosis Date  . Asthma   . Cancer (HCC)    mild skin  . Chronic diastolic CHF (congestive heart failure) ([place])   . Morbid obesity ([place])   . OSA (obstructive sleep apnea)   . PAF (paroxysmal atrial fibrillation) ([place]) 08/14/2015   s/p TEE/DCCV to NSR  . RBBB    atrial fib on cardizem    Past Surgical History:  Procedure Laterality Date  . CARDIOVERSION N/A 05/19/2015   Procedure: CARDIOVERSION;  Surgeon: Fay Records, MD;  Location: [facility] ENDOSCOPY;  Service: Cardiovascular;  Laterality: N/A;  . CHOLECYSTECTOMY N/A 12/01/2012   Procedure: LAPAROSCOPIC CHOLECYSTECTOMY;  Surgeon: Gwenyth Ober, MD;  Location: [employer];  Service: General;  Laterality: N/A;  . right knee     meniscus removal 6 years ago  . SHOULDER SURGERY    . TEE WITHOUT CARDIOVERSION N/A 05/19/2015   Procedure: TRANSESOPHAGEAL ECHOCARDIOGRAM (TEE);  Surgeon: Fay Records, MD;  Location: [facility] ENDOSCOPY;  Service: Cardiovascular;  Laterality: N/A;    Family History: Family History  Problem Relation Age of Onset  . Hypotension Mother   . Heart disease Sister     Social History:   reports that he has quit smoking. He quit after 20.00 years of use. He has never used smokeless tobacco. He reports that he drinks alcohol. He reports that he does not use drugs.  Medications: Medications Prior to Admission  Medication Sig Dispense Refill  . aspirin EC 81 MG tablet Take 81 mg by mouth daily.    Marland Kitchen atorvastatin (LIPITOR) 40 MG tablet Take 40 mg by mouth daily.    . Cholecalciferol (VITAMIN D3) 2000 UNITS TABS Take 1,000 Units by mouth daily.     Marland Kitchen diltiazem (CARDIZEM CD) 120 MG 24 hr capsule Take 1 capsule (120 mg total) by mouth daily. 90  capsule 1  . fish oil-omega-3 fatty acids 1000 MG capsule Take 2 g by mouth daily.    . Fluticasone-Salmeterol (ADVAIR) 250-50 MCG/DOSE AEPB Inhale 1 puff into the lungs 2 (two) times daily.    . hydrochlorothiazide (HYDRODIURIL) 25 MG tablet Take 1 tablet (25 mg total) by mouth daily. 90 tablet 1  . loratadine (CLARITIN) 10 MG tablet Take 10 mg by mouth daily.    . meloxicam (MOBIC) 15 MG tablet Take 1 tablet by mouth daily.    . metoprolol tartrate (LOPRESSOR) 25 MG tablet Take 1 tablet (25 mg total) by mouth 2 (two) times daily. 180 tablet 1  . albuterol (PROVENTIL HFA;VENTOLIN HFA) 108 (90 BASE) MCG/ACT inhaler Inhale 2 puffs into the lungs every 4 (four) hours as needed for wheezing or shortness of breath.      No results found for this or any previous visit (from the past 48 hour(s)).  No results found.   A comprehensive review of systems was negative.  Blood pressure 120/70, pulse 67, temperature 97.8 F (36.6 C), temperature source Oral, resp. rate 14, height 6\' 1"  (1.854 m), weight (!) 160.5 kg (353 lb 12.8 oz), SpO2 96 %.  General appearance: alert, cooperative and appears stated age Head: Normocephalic, without obvious abnormality, atraumatic Neck: supple, symmetrical, trachea midline Cardio: regular rate and rhythm Resp:  clear to auscultation bilaterally Extremities: Intact sensation and capillary refill all digits.  +epl/fpl/io.  No wounds.  Pulses: 2+ and symmetric Skin: Skin color, texture, turgor normal. No rashes or lesions Neurologic: Grossly normal Incision/Wound: none  Assessment/Plan Right thumb mucoid cyst and IP joint arthritis.  Non operative and operative treatment options were discussed with the patient and patient wishes to proceed with operative treatment. Risks, benefits, and alternatives of surgery were discussed and the patient agrees with the plan of care.   [name] R 02/20/2018, 1:50 PM

## 2018-02-21 ENCOUNTER — Encounter (HOSPITAL_BASED_OUTPATIENT_CLINIC_OR_DEPARTMENT_OTHER): Payer: Self-pay | Admitting: Orthopedic Surgery

## 2018-03-07 DIAGNOSIS — M19041 Primary osteoarthritis, right hand: Secondary | ICD-10-CM | POA: Insufficient documentation

## 2018-05-10 DIAGNOSIS — H65112 Acute and subacute allergic otitis media (mucoid) (sanguinous) (serous), left ear: Secondary | ICD-10-CM | POA: Diagnosis not present

## 2018-06-06 DIAGNOSIS — R69 Illness, unspecified: Secondary | ICD-10-CM | POA: Diagnosis not present

## 2018-08-31 ENCOUNTER — Encounter: Payer: Self-pay | Admitting: Physician Assistant

## 2018-09-08 DIAGNOSIS — Z0000 Encounter for general adult medical examination without abnormal findings: Secondary | ICD-10-CM | POA: Diagnosis not present

## 2018-09-08 DIAGNOSIS — I480 Paroxysmal atrial fibrillation: Secondary | ICD-10-CM | POA: Diagnosis not present

## 2018-09-08 DIAGNOSIS — I10 Essential (primary) hypertension: Secondary | ICD-10-CM | POA: Diagnosis not present

## 2018-09-08 DIAGNOSIS — E782 Mixed hyperlipidemia: Secondary | ICD-10-CM | POA: Diagnosis not present

## 2018-09-08 DIAGNOSIS — J4540 Moderate persistent asthma, uncomplicated: Secondary | ICD-10-CM | POA: Diagnosis not present

## 2018-09-08 DIAGNOSIS — R350 Frequency of micturition: Secondary | ICD-10-CM | POA: Diagnosis not present

## 2018-09-08 DIAGNOSIS — Z6841 Body Mass Index (BMI) 40.0 and over, adult: Secondary | ICD-10-CM | POA: Diagnosis not present

## 2018-09-13 DIAGNOSIS — N401 Enlarged prostate with lower urinary tract symptoms: Secondary | ICD-10-CM | POA: Insufficient documentation

## 2018-09-19 NOTE — Progress Notes (Signed)
Cardiology Office Note    Date:  09/20/2018   ID:  Victor Medina, DOB 21-Jun-1946, MRN [id number]  PCP:  Christain Sacramento, MD  Cardiologist: Fransico Him, MD EPS: None  Chief Complaint  Patient presents with  . Follow-up    History of Present Illness:  Victor Medina is a 73 y.o. male with history of PAF as post cardioversion 2016, mildly dilated aortic root, chronic diastolic CHF, chronic lower extremity edema, RBBB, OSA on CPAP.  Last echo 09/2016 normal LVEF with normal aortic root.  CHA2DS2-VASc equals 2.  Patient stopped Xarelto after 1 month and refused further anticoagulation.  Last seen in our office 02/2018 for preop clearance.  Patient comes in for f/u. Chronic leg edema-this is same and has been for years.  Goes down in the morning. Compliant with CPAP. 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 Not interested in weight loss.  Denies any cardiac complaints.  Past Medical History:  Diagnosis Date  . Asthma   . Cancer (HCC)    mild skin  . Chronic diastolic CHF (congestive heart failure) ([place])   . Morbid obesity ([place])   . OSA (obstructive sleep apnea)   . PAF (paroxysmal atrial fibrillation) ([place]) 08/14/2015   s/p TEE/DCCV to NSR  . RBBB    atrial fib on cardizem    Past Surgical History:  Procedure Laterality Date  . CARDIOVERSION N/A 05/19/2015   Procedure: CARDIOVERSION;  Surgeon: Fay Records, MD;  Location: [facility] ENDOSCOPY;  Service: Cardiovascular;  Laterality: N/A;  . CHOLECYSTECTOMY N/A 12/01/2012   Procedure: LAPAROSCOPIC CHOLECYSTECTOMY;  Surgeon: Gwenyth Ober, MD;  Location: [place];  Service: General;  Laterality: N/A;  . MASS EXCISION Right 02/20/2018   Procedure: RIGHT THUMB EXCISION MASS AND DEBRIDEMENT DISTAL INTRPHLANGEAL JOINT;  Surgeon: Leanora Cover, MD;  Location: [place];  Service: Orthopedics;  Laterality: Right;  . right knee     meniscus removal 6 years ago  . SHOULDER SURGERY    . TEE WITHOUT  CARDIOVERSION N/A 05/19/2015   Procedure: TRANSESOPHAGEAL ECHOCARDIOGRAM (TEE);  Surgeon: Fay Records, MD;  Location: [facility] ENDOSCOPY;  Service: Cardiovascular;  Laterality: N/A;    Current Medications: Current Meds  Medication Sig  . albuterol (PROVENTIL HFA;VENTOLIN HFA) 108 (90 BASE) MCG/ACT inhaler Inhale 2 puffs into the lungs every 4 (four) hours as needed for wheezing or shortness of breath.  Marland Kitchen aspirin EC 81 MG tablet Take 81 mg by mouth daily.  Marland Kitchen atorvastatin (LIPITOR) 40 MG tablet Take 40 mg by mouth daily.  . Cholecalciferol (VITAMIN D3) 2000 UNITS TABS Take 4,000 Units by mouth daily.   Marland Kitchen diltiazem (CARDIZEM CD) 120 MG 24 hr capsule Take 1 capsule (120 mg total) by mouth daily.  . fish oil-omega-3 fatty acids 1000 MG capsule Take 2 g by mouth daily.  . Fluticasone-Salmeterol (ADVAIR) 250-50 MCG/DOSE AEPB Inhale 1 puff into the lungs 2 (two) times daily.  . hydrochlorothiazide (HYDRODIURIL) 25 MG tablet Take 1 tablet (25 mg total) by mouth daily.  Marland Kitchen loratadine (CLARITIN) 10 MG tablet Take 10 mg by mouth daily.  . meloxicam (MOBIC) 15 MG tablet Take 1 tablet by mouth daily.  . metoprolol tartrate (LOPRESSOR) 25 MG tablet Take 1 tablet (25 mg total) by mouth 2 (two) times daily.  . tamsulosin (FLOMAX) 0.4 MG CAPS capsule Take 0.4 mg by mouth daily.      Allergies:   Patient has no known allergies.  Social History   Socioeconomic History  . Marital status: Married    Spouse name: Not on file  . Number of children: Not on file  . Years of education: Not on file  . Highest education level: Not on file  Occupational History  . Not on file  Social Needs  . Financial resource strain: Not on file  . Food insecurity:    Worry: Not on file    Inability: Not on file  . Transportation needs:    Medical: Not on file    Non-medical: Not on file  Tobacco Use  . Smoking status: Former Smoker    Years: 20.00  . Smokeless tobacco: Never Used  Substance and Sexual Activity  .  Alcohol use: Yes    Alcohol/week: 0.0 standard drinks    Comment: rarely  . Drug use: No  . Sexual activity: Never  Lifestyle  . Physical activity:    Days per week: Not on file    Minutes per session: Not on file  . Stress: Not on file  Relationships  . Social connections:    Talks on phone: Not on file    Gets together: Not on file    Attends religious service: Not on file    Active member of club or organization: Not on file    Attends meetings of clubs or organizations: Not on file    Relationship status: Not on file  Other Topics Concern  . Not on file  Social History Narrative  . Not on file     Family History:  The patient's family history includes Heart disease in his sister; Hypotension in his mother.   ROS:   Please see the history of present illness.    Review of Systems  Constitution: Negative.  HENT: Positive for hearing loss.   Cardiovascular: Positive for leg swelling.  Respiratory: Positive for snoring.   Endocrine: Negative.   Hematologic/Lymphatic: Negative.   Musculoskeletal: Positive for joint swelling.  Gastrointestinal: Negative.   Genitourinary: Positive for frequency.  Neurological: Negative.    All other systems reviewed and are negative.   PHYSICAL EXAM:   VS:  BP 104/76 (BP Location: Right Arm, Patient Position: Sitting, Cuff Size: Large)   Pulse 85   Ht 6\' 1"  (1.854 m)   Wt (!) 357 lb 1.9 oz (162 kg)   SpO2 98% Comment: at rest  BMI 47.12 kg/m   Physical Exam  GEN: Obese, in no acute distress  Neck: no JVD, carotid bruits, or masses Cardiac:RRR; no murmurs, rubs, or gallops  Respiratory:  clear to auscultation bilaterally, normal work of breathing GI: soft, nontender, nondistended, + BS Ext: +1-2 edema bilaterally without cyanosis, clubbing, Good distal pulses bilaterally Neuro:  Alert and Oriented x 3 Psych: euthymic mood, full affect  Wt Readings from Last 3 Encounters:  09/20/18 (!) 357 lb 1.9 oz (162 kg)  02/20/18 (!) 353  lb 12.8 oz (160.5 kg)  02/08/18 (!) 355 lb (161 kg)      Studies/Labs Reviewed:   EKG:  EKG is not ordered today.  Recent Labs: 02/15/2018: BUN 27; Creatinine, Ser 1.21; Potassium 3.5; Sodium 140   Lipid Panel No results found for: CHOL, TRIG, HDL, CHOLHDL, VLDL, LDLCALC, LDLDIRECT  Additional studies/ records that were reviewed today include:   Echocardiogram: 09/2016 ------------------------------------------------------------------- Study Conclusions   - Left ventricle: The cavity size was normal. Wall thickness was   normal. Systolic function was normal. The estimated ejection   fraction was in the range  of 60% to 65%. Wall motion was normal;   there were no regional wall motion abnormalities. Doppler   parameters are consistent with abnormal left ventricular   relaxation (grade 1 diastolic dysfunction). - Right ventricle: The cavity size was mildly dilated. - Right atrium: The atrium was moderately dilated.           ASSESSMENT:    1. PAF (paroxysmal atrial fibrillation) ([place])   2. Chronic diastolic CHF (congestive heart failure) ([place])   3. Essential hypertension   4. Hyperlipidemia, unspecified hyperlipidemia type   5. Morbid obesity ([place])   6. Aortic root dilatation (HCC)      PLAN:  In order of problems listed above:  PAF STATUS POST CARDIOVERSION 2016 CHA2DS2-VASc equals 2 but patient has refused anticoagulation.  No recurrent atrial fibrillation.  Follow-up with Dr. Radford Pax in 1 year.  Chronic diastolic CHF Echo 03/9210 normal LVEF 60 to 65% with grade 1 DD.  Chronic lower extremity edema unchanged.  He would benefit from exercise and weight loss.  New HCTZ.  Patient had labs drawn by PCP in January and they were stable.  Essential hypertension blood pressure actually on the low side on diltiazem and metoprolol  OSA compliant with CPAP   Hyperlipidemia on Lipitor and fish oil.  Patient was not fasting when he had his labs drawn in January and has to go  back for repeat labs.  Managed by PCP.  Morbid obesity.  Long discussion with patient about the importance of exercise and weight loss.  Recommend 150 minutes of exercise weekly.  Discussed weight loss program but he is not interested.  History of aortic root dilatation but was normal on last echo in 2018.  Medication Adjustments/Labs and Tests Ordered: Current medicines are reviewed at length with the patient today.  Concerns regarding medicines are outlined above.  Medication changes, Labs and Tests ordered today are listed in the Patient Instructions below. Patient Instructions  Medication Instructions:  Your physician recommends that you continue on your current medications as directed. Please refer to the Current Medication list given to you today.  If you need a refill on your cardiac medications before your next appointment, please call your pharmacy.   Lab work: None Ordered  If you have labs (blood work) drawn today and your tests are completely normal, you will receive your results only by: Marland Kitchen MyChart Message (if you have MyChart) OR . A paper copy in the mail If you have any lab test that is abnormal or we need to change your treatment, we will call you to review the results.  Testing/Procedures: None ordered  Follow-Up: At [facility], you and your health needs are our priority.  As part of our continuing mission to provide you with exceptional heart care, we have created designated Provider Care Teams.  These Care Teams include your primary Cardiologist (physician) and Advanced Practice Providers (APPs -  Physician Assistants and Nurse Practitioners) who all work together to provide you with the care you need, when you need it. . You will need a follow up appointment in 1 year.  Please call our office 2 months in advance to schedule this appointment.  You may see Fransico Him, MD or one of the following Advanced Practice Providers on your designated Care Team:   . Lyda Jester, PA-C . Dayna Dunn, PA-C . Ermalinda Barrios, PA-C  Any Other Special Instructions Will Be Listed Below (If Applicable).  Your provider recommends that you maintain 150 minutes per week  of moderate aerobic activity.  Your provider recommends that you enroll in a weight loss program     Signed, Ermalinda Barrios, Hershal Coria  09/20/2018 10:28 AM    [address], [place], [place]  [address] Phone: [phone]; Fax: [phone]

## 2018-09-20 ENCOUNTER — Ambulatory Visit: Payer: Medicare HMO | Admitting: Physician Assistant

## 2018-09-20 ENCOUNTER — Encounter: Payer: Self-pay | Admitting: Physician Assistant

## 2018-09-20 VITALS — BP 104/76 | HR 85 | Ht 73.0 in | Wt 357.1 lb

## 2018-09-20 DIAGNOSIS — I480 Paroxysmal atrial fibrillation: Secondary | ICD-10-CM

## 2018-09-20 DIAGNOSIS — I77810 Thoracic aortic ectasia: Secondary | ICD-10-CM | POA: Diagnosis not present

## 2018-09-20 DIAGNOSIS — I5032 Chronic diastolic (congestive) heart failure: Secondary | ICD-10-CM

## 2018-09-20 DIAGNOSIS — I10 Essential (primary) hypertension: Secondary | ICD-10-CM

## 2018-09-20 DIAGNOSIS — E785 Hyperlipidemia, unspecified: Secondary | ICD-10-CM | POA: Diagnosis not present

## 2018-09-20 NOTE — Patient Instructions (Signed)
Medication Instructions:  Your physician recommends that you continue on your current medications as directed. Please refer to the Current Medication list given to you today.  If you need a refill on your cardiac medications before your next appointment, please call your pharmacy.   Lab work: None Ordered  If you have labs (blood work) drawn today and your tests are completely normal, you will receive your results only by: Marland Kitchen MyChart Message (if you have MyChart) OR . A paper copy in the mail If you have any lab test that is abnormal or we need to change your treatment, we will call you to review the results.  Testing/Procedures: None ordered  Follow-Up: At [facility], you and your health needs are our priority.  As part of our continuing mission to provide you with exceptional heart care, we have created designated Provider Care Teams.  These Care Teams include your primary Cardiologist (physician) and Advanced Practice Providers (APPs -  Physician Assistants and Nurse Practitioners) who all work together to provide you with the care you need, when you need it. . You will need a follow up appointment in 1 year.  Please call our office 2 months in advance to schedule this appointment.  You may see Fransico Him, MD or one of the following Advanced Practice Providers on your designated Care Team:   . Lyda Jester, PA-C . Dayna Dunn, PA-C . Ermalinda Barrios, PA-C  Any Other Special Instructions Will Be Listed Below (If Applicable).  Your provider recommends that you maintain 150 minutes per week of moderate aerobic activity.  Your provider recommends that you enroll in a weight loss program

## 2018-09-25 DIAGNOSIS — I10 Essential (primary) hypertension: Secondary | ICD-10-CM | POA: Diagnosis not present

## 2018-09-25 DIAGNOSIS — E782 Mixed hyperlipidemia: Secondary | ICD-10-CM | POA: Diagnosis not present

## 2018-10-04 ENCOUNTER — Other Ambulatory Visit: Payer: Self-pay | Admitting: Gastroenterology

## 2018-10-04 DIAGNOSIS — J45998 Other asthma: Secondary | ICD-10-CM | POA: Diagnosis not present

## 2018-10-04 DIAGNOSIS — Z86010 Personal history of colonic polyps: Secondary | ICD-10-CM | POA: Diagnosis not present

## 2018-10-04 DIAGNOSIS — I10 Essential (primary) hypertension: Secondary | ICD-10-CM | POA: Diagnosis not present

## 2018-11-08 DIAGNOSIS — M1712 Unilateral primary osteoarthritis, left knee: Secondary | ICD-10-CM | POA: Diagnosis not present

## 2018-11-08 DIAGNOSIS — Z6841 Body Mass Index (BMI) 40.0 and over, adult: Secondary | ICD-10-CM | POA: Diagnosis not present

## 2018-11-08 DIAGNOSIS — M1711 Unilateral primary osteoarthritis, right knee: Secondary | ICD-10-CM | POA: Diagnosis not present

## 2018-11-10 ENCOUNTER — Encounter (HOSPITAL_COMMUNITY): Admission: RE | Payer: Self-pay | Source: Home / Self Care

## 2018-11-10 ENCOUNTER — Ambulatory Visit (HOSPITAL_COMMUNITY): Admission: RE | Admit: 2018-11-10 | Payer: Medicare HMO | Source: Home / Self Care | Admitting: Gastroenterology

## 2018-11-10 SURGERY — COLONOSCOPY WITH PROPOFOL
Anesthesia: Monitor Anesthesia Care

## 2018-12-25 DIAGNOSIS — H35033 Hypertensive retinopathy, bilateral: Secondary | ICD-10-CM | POA: Diagnosis not present

## 2018-12-25 DIAGNOSIS — H40013 Open angle with borderline findings, low risk, bilateral: Secondary | ICD-10-CM | POA: Diagnosis not present

## 2018-12-25 DIAGNOSIS — H2513 Age-related nuclear cataract, bilateral: Secondary | ICD-10-CM | POA: Diagnosis not present

## 2018-12-25 DIAGNOSIS — H25013 Cortical age-related cataract, bilateral: Secondary | ICD-10-CM | POA: Diagnosis not present

## 2018-12-27 DIAGNOSIS — M1711 Unilateral primary osteoarthritis, right knee: Secondary | ICD-10-CM | POA: Diagnosis not present

## 2018-12-27 DIAGNOSIS — M1712 Unilateral primary osteoarthritis, left knee: Secondary | ICD-10-CM | POA: Diagnosis not present

## 2019-01-26 DIAGNOSIS — R69 Illness, unspecified: Secondary | ICD-10-CM | POA: Diagnosis not present

## 2019-02-07 DIAGNOSIS — M1712 Unilateral primary osteoarthritis, left knee: Secondary | ICD-10-CM | POA: Diagnosis not present

## 2019-02-07 DIAGNOSIS — M1711 Unilateral primary osteoarthritis, right knee: Secondary | ICD-10-CM | POA: Diagnosis not present

## 2019-02-14 DIAGNOSIS — M1711 Unilateral primary osteoarthritis, right knee: Secondary | ICD-10-CM | POA: Diagnosis not present

## 2019-02-14 DIAGNOSIS — M1712 Unilateral primary osteoarthritis, left knee: Secondary | ICD-10-CM | POA: Diagnosis not present

## 2019-02-21 DIAGNOSIS — M1712 Unilateral primary osteoarthritis, left knee: Secondary | ICD-10-CM | POA: Diagnosis not present

## 2019-02-21 DIAGNOSIS — M1711 Unilateral primary osteoarthritis, right knee: Secondary | ICD-10-CM | POA: Diagnosis not present

## 2019-04-05 DIAGNOSIS — M1712 Unilateral primary osteoarthritis, left knee: Secondary | ICD-10-CM | POA: Diagnosis not present

## 2019-04-05 DIAGNOSIS — M7062 Trochanteric bursitis, left hip: Secondary | ICD-10-CM | POA: Diagnosis not present

## 2019-04-05 DIAGNOSIS — M1711 Unilateral primary osteoarthritis, right knee: Secondary | ICD-10-CM | POA: Diagnosis not present

## 2019-04-18 DIAGNOSIS — R69 Illness, unspecified: Secondary | ICD-10-CM | POA: Diagnosis not present

## 2019-05-17 DIAGNOSIS — M1712 Unilateral primary osteoarthritis, left knee: Secondary | ICD-10-CM | POA: Diagnosis not present

## 2019-05-17 DIAGNOSIS — M1711 Unilateral primary osteoarthritis, right knee: Secondary | ICD-10-CM | POA: Diagnosis not present

## 2019-05-17 DIAGNOSIS — M7062 Trochanteric bursitis, left hip: Secondary | ICD-10-CM | POA: Diagnosis not present

## 2019-06-18 ENCOUNTER — Other Ambulatory Visit: Payer: Self-pay | Admitting: Gastroenterology

## 2019-07-10 ENCOUNTER — Other Ambulatory Visit (HOSPITAL_COMMUNITY)
Admission: RE | Admit: 2019-07-10 | Discharge: 2019-07-10 | Disposition: A | Discharge status: Home / Self Care | Payer: Medicare HMO | Source: Ambulatory Visit | Attending: Gastroenterology | Admitting: Gastroenterology

## 2019-07-10 DIAGNOSIS — Z20828 Contact with and (suspected) exposure to other viral communicable diseases: Secondary | ICD-10-CM | POA: Insufficient documentation

## 2019-07-10 DIAGNOSIS — Z01812 Encounter for preprocedural laboratory examination: Secondary | ICD-10-CM | POA: Diagnosis not present

## 2019-07-11 ENCOUNTER — Encounter (HOSPITAL_COMMUNITY): Payer: Self-pay | Admitting: Emergency Medicine

## 2019-07-11 ENCOUNTER — Other Ambulatory Visit: Payer: Self-pay

## 2019-07-11 NOTE — Progress Notes (Signed)
  Pre-op endo call completed.   ASA 81mg  on hold as of 11-30 per patient  OSA -bringing mask and tubing  Hx of parox afib : patient denies palpitations, sob , chest pain/pressure; lov cardio feb 2020

## 2019-07-12 LAB — NOVEL CORONAVIRUS, NAA (HOSP ORDER, SEND-OUT TO REF LAB; TAT 18-24 HRS): SARS-CoV-2, NAA: NOT DETECTED

## 2019-07-12 NOTE — Anesthesia Preprocedure Evaluation (Addendum)
Anesthesia Evaluation  Patient identified by MRN, date of birth, ID band Patient awake    Reviewed: Allergy & Precautions, NPO status , Patient's Chart, lab work & pertinent test results  Airway Mallampati: II  TM Distance: >3 FB Neck ROM: Full    Dental  (+) Dental Advisory Given, Implants   Pulmonary asthma , sleep apnea , former smoker,    Pulmonary exam normal breath sounds clear to auscultation       Cardiovascular hypertension, Pt. on medications +CHF  Normal cardiovascular exam+ dysrhythmias (RBBB) Atrial Fibrillation  Rhythm:Regular Rate:Normal  Echo 09/2016: - Left ventricle: The cavity size was normal. Wall thickness was normal. Systolic function was normal. The estimated ejection fraction was in the range of 60% to 65%. Wall motion was normal; there were no regional wall motion abnormalities. Doppler parameters are consistent with abnormal left ventricular relaxation (grade 1 diastolic dysfunction). - Right ventricle: The cavity size was mildly dilated. - Right atrium: The atrium was moderately dilated.   Neuro/Psych negative neurological ROS  negative psych ROS   GI/Hepatic negative GI ROS, Neg liver ROS,   Endo/Other  negative endocrine ROS  Renal/GU negative Renal ROS     Musculoskeletal negative musculoskeletal ROS (+)   Abdominal   Peds  Hematology negative hematology ROS (+)   Anesthesia Other Findings Day of surgery medications reviewed with the patient.  Reproductive/Obstetrics                           Anesthesia Physical Anesthesia Plan  ASA: III  Anesthesia Plan: MAC   Post-op Pain Management:    Induction: Intravenous  PONV Risk Score and Plan: 1 and Propofol infusion and Treatment may vary due to age or medical condition  Airway Management Planned: Nasal Cannula and Natural Airway  Additional Equipment:   Intra-op Plan:   Post-operative Plan:   Informed  Consent: I have reviewed the patients History and Physical, chart, labs and discussed the procedure including the risks, benefits and alternatives for the proposed anesthesia with the patient or authorized representative who has indicated his/her understanding and acceptance.     Dental advisory given  Plan Discussed with: CRNA  Anesthesia Plan Comments:        Anesthesia Quick Evaluation

## 2019-07-13 ENCOUNTER — Encounter (HOSPITAL_COMMUNITY)
Admission: RE | Disposition: A | Discharge status: Home / Self Care | Payer: Self-pay | Source: Home / Self Care | Attending: Gastroenterology

## 2019-07-13 ENCOUNTER — Ambulatory Visit (HOSPITAL_COMMUNITY): Payer: Medicare HMO | Admitting: Anesthesiology

## 2019-07-13 ENCOUNTER — Ambulatory Visit (HOSPITAL_COMMUNITY)
Admission: RE | Admit: 2019-07-13 | Discharge: 2019-07-13 | Disposition: A | Discharge status: Home / Self Care | Payer: Medicare HMO | Attending: Gastroenterology | Admitting: Gastroenterology

## 2019-07-13 ENCOUNTER — Encounter (HOSPITAL_COMMUNITY): Payer: Self-pay | Admitting: Anesthesiology

## 2019-07-13 ENCOUNTER — Other Ambulatory Visit: Payer: Self-pay

## 2019-07-13 DIAGNOSIS — Z6841 Body Mass Index (BMI) 40.0 and over, adult: Secondary | ICD-10-CM | POA: Diagnosis not present

## 2019-07-13 DIAGNOSIS — Z1211 Encounter for screening for malignant neoplasm of colon: Secondary | ICD-10-CM | POA: Insufficient documentation

## 2019-07-13 DIAGNOSIS — K635 Polyp of colon: Secondary | ICD-10-CM | POA: Diagnosis not present

## 2019-07-13 DIAGNOSIS — J45909 Unspecified asthma, uncomplicated: Secondary | ICD-10-CM | POA: Insufficient documentation

## 2019-07-13 DIAGNOSIS — I480 Paroxysmal atrial fibrillation: Secondary | ICD-10-CM | POA: Insufficient documentation

## 2019-07-13 DIAGNOSIS — D123 Benign neoplasm of transverse colon: Secondary | ICD-10-CM | POA: Diagnosis not present

## 2019-07-13 DIAGNOSIS — D122 Benign neoplasm of ascending colon: Secondary | ICD-10-CM | POA: Insufficient documentation

## 2019-07-13 DIAGNOSIS — K5730 Diverticulosis of large intestine without perforation or abscess without bleeding: Secondary | ICD-10-CM | POA: Insufficient documentation

## 2019-07-13 DIAGNOSIS — G4733 Obstructive sleep apnea (adult) (pediatric): Secondary | ICD-10-CM | POA: Insufficient documentation

## 2019-07-13 DIAGNOSIS — Z86010 Personal history of colonic polyps: Secondary | ICD-10-CM | POA: Insufficient documentation

## 2019-07-13 DIAGNOSIS — E785 Hyperlipidemia, unspecified: Secondary | ICD-10-CM | POA: Diagnosis not present

## 2019-07-13 DIAGNOSIS — Z87891 Personal history of nicotine dependence: Secondary | ICD-10-CM | POA: Insufficient documentation

## 2019-07-13 DIAGNOSIS — I5032 Chronic diastolic (congestive) heart failure: Secondary | ICD-10-CM | POA: Insufficient documentation

## 2019-07-13 HISTORY — PX: COLONOSCOPY WITH PROPOFOL: SHX5780

## 2019-07-13 HISTORY — PX: POLYPECTOMY: SHX5525

## 2019-07-13 SURGERY — COLONOSCOPY WITH PROPOFOL
Anesthesia: Monitor Anesthesia Care

## 2019-07-13 MED ORDER — FENTANYL CITRATE (PF) 100 MCG/2ML IJ SOLN: 25.0000 ug | INTRAMUSCULAR | Status: DC | PRN

## 2019-07-13 MED ORDER — PHENYLEPHRINE HCL-NACL 10-0.9 MG/250ML-% IV SOLN
INTRAVENOUS | Status: DC | PRN
  Administered 2019-07-13: 20 ug/min via INTRAVENOUS

## 2019-07-13 MED ORDER — ONDANSETRON HCL 4 MG/2ML IJ SOLN: 4.0000 mg | Freq: Once | INTRAMUSCULAR | Status: DC | PRN

## 2019-07-13 MED ORDER — LIDOCAINE HCL 1 % IJ SOLN
INTRAMUSCULAR | Status: DC | PRN
  Administered 2019-07-13: 50 mg via INTRADERMAL

## 2019-07-13 MED ORDER — METOPROLOL TARTRATE 5 MG/5ML IV SOLN
INTRAVENOUS | Status: AC
  Administered 2019-07-13: 2.5 mg via INTRAVENOUS
  Filled 2019-07-13: qty 5

## 2019-07-13 MED ORDER — SODIUM CHLORIDE 0.9 % IV SOLN: INTRAVENOUS | Status: DC

## 2019-07-13 MED ORDER — METOPROLOL TARTRATE 5 MG/5ML IV SOLN
2.5000 mg | Freq: Once | INTRAVENOUS | Status: AC
  Administered 2019-07-13: 07:00:00 2.5 mg via INTRAVENOUS
  Filled 2019-07-13: qty 5

## 2019-07-13 MED ORDER — PROPOFOL 10 MG/ML IV BOLUS
INTRAVENOUS | Status: DC | PRN
  Administered 2019-07-13: 20 mg via INTRAVENOUS
  Administered 2019-07-13: 10 mg via INTRAVENOUS

## 2019-07-13 MED ORDER — PHENYLEPHRINE HCL (PRESSORS) 10 MG/ML IV SOLN
INTRAVENOUS | Status: DC | PRN
  Administered 2019-07-13: 40 ug via INTRAVENOUS
  Administered 2019-07-13 (×3): 80 ug via INTRAVENOUS
  Administered 2019-07-13: 120 ug via INTRAVENOUS
  Administered 2019-07-13: 100 ug via INTRAVENOUS
  Administered 2019-07-13: 120 ug via INTRAVENOUS
  Administered 2019-07-13: 80 ug via INTRAVENOUS
  Administered 2019-07-13: 100 ug via INTRAVENOUS

## 2019-07-13 MED ORDER — PROPOFOL 500 MG/50ML IV EMUL
INTRAVENOUS | Status: AC
  Filled 2019-07-13: qty 50

## 2019-07-13 MED ORDER — LACTATED RINGERS IV SOLN
INTRAVENOUS | Status: DC
  Administered 2019-07-13: 07:00:00 via INTRAVENOUS

## 2019-07-13 MED ORDER — PROPOFOL 500 MG/50ML IV EMUL
INTRAVENOUS | Status: DC | PRN
  Administered 2019-07-13: 100 ug/kg/min via INTRAVENOUS

## 2019-07-13 MED ORDER — PROPOFOL 10 MG/ML IV BOLUS
INTRAVENOUS | Status: AC
  Filled 2019-07-13: qty 20

## 2019-07-13 SURGICAL SUPPLY — 22 items

## 2019-07-13 NOTE — Anesthesia Postprocedure Evaluation (Signed)
Anesthesia Post Note  Patient: Victor Medina  Procedure(s) Performed: COLONOSCOPY WITH PROPOFOL (N/A ) POLYPECTOMY     Patient location during evaluation: Endoscopy Anesthesia Type: MAC Level of consciousness: awake and alert Pain management: pain level controlled Vital Signs Assessment: post-procedure vital signs reviewed and stable Respiratory status: spontaneous breathing, nonlabored ventilation and respiratory function stable Cardiovascular status: stable and blood pressure returned to baseline Postop Assessment: no apparent nausea or vomiting Anesthetic complications: no    Last Vitals:  Vitals:   07/13/19 0850 07/13/19 0900  BP: 126/83 123/79  Pulse: 95 90  Resp: 19 17  Temp:    SpO2: 98% 97%    Last Pain:  Vitals:   07/13/19 0839  TempSrc: Oral  PainSc: 0-No pain                 Catalina Gravel

## 2019-07-13 NOTE — Op Note (Signed)
[facility] Patient Name: Victor Medina Procedure Date: 07/13/2019 MRN: [id number] Attending MD: Carol Ada , MD Date of Birth: 1946-02-27 CSN: [id number] Age: 73 Admit Type: Outpatient Procedure:                Colonoscopy Indications:              High risk colon cancer surveillance: Personal                            history of colonic polyps Providers:                Carol Ada, MD, Carmie End, RN, Othman Dalton, Technician Referring MD:              Medicines:                Propofol per Anesthesia Complications:            No immediate complications. Estimated Blood Loss:     Estimated blood loss was minimal. Procedure:                Pre-Anesthesia Assessment:                           - Prior to the procedure, a History and Physical                            was performed, and patient medications and                            allergies were reviewed. The patient's tolerance of                            previous anesthesia was also reviewed. The risks                            and benefits of the procedure and the sedation                            options and risks were discussed with the patient.                            All questions were answered, and informed consent                            was obtained. Prior Anticoagulants: The patient has                            taken no previous anticoagulant or antiplatelet                            agents. ASA Grade Assessment: III - A patient with                            severe  systemic disease. After reviewing the risks                            and benefits, the patient was deemed in                            satisfactory condition to undergo the procedure.                           - Sedation was administered by an anesthesia                            professional. Deep sedation was attained.                           After obtaining informed consent,  the colonoscope                            was passed under direct vision. Throughout the                            procedure, the patient's blood pressure, pulse, and                            oxygen saturations were monitored continuously. The                            [id number] Olympus colonoscope was                            introduced through the anus and advanced to the the                            cecum, identified by appendiceal orifice and                            ileocecal valve. The colonoscopy was performed with                            difficulty due to significant looping, a tortuous                            colon and the patient's body habitus. Successful                            completion of the procedure was aided by using                            manual pressure and straightening and shortening                            the scope to obtain bowel loop reduction. The  patient tolerated the procedure well. The quality                            of the bowel preparation was good. The ileocecal                            valve, appendiceal orifice, and rectum were                            photographed. Scope In: 7:52:49 AM Scope Out: 8:29:05 AM Scope Withdrawal Time: 0 hours 22 minutes 16 seconds  Total Procedure Duration: 0 hours 36 minutes 16 seconds  Findings:      Five sessile polyps were found in the transverse colon, hepatic flexure       and ascending colon. The polyps were 3 to 10 mm in size. These polyps       were removed with a cold snare. Resection and retrieval were complete.      A few small-mouthed diverticula were found in the sigmoid colon.      A tattoo was seen at the splenic flexure and in the transverse colon.       The tattoo site appeared normal. Impression:               - Five 3 to 10 mm polyps in the transverse colon,                            at the hepatic flexure and in the ascending  colon,                            removed with a cold snare. Resected and retrieved.                           - Diverticulosis in the sigmoid colon.                           - A tattoo was seen at the splenic flexure and in                            the transverse colon. The tattoo site appeared                            normal. Moderate Sedation:      Not Applicable - Patient had care per Anesthesia. Recommendation:           - Patient has a contact number available for                            emergencies. The signs and symptoms of potential                            delayed complications were discussed with the                            patient. Return to normal activities tomorrow.  Written discharge instructions were provided to the                            patient.                           - Resume previous diet.                           - Continue present medications.                           - Await pathology results.                           - Repeat colonoscopy in 3 years for surveillance. Procedure Code(s):        --- Professional ---                           [phone], Colonoscopy, flexible; with removal of                            tumor(s), polyp(s), or other lesion(s) by snare                            technique Diagnosis Code(s):        --- Professional ---                           K63.5, Polyp of colon                           Z86.010, Personal history of colonic polyps                           K57.30, Diverticulosis of large intestine without                            perforation or abscess without bleeding CPT copyright 2019 American Medical Association. All rights reserved. The codes documented in this report are preliminary and upon coder review may  be revised to meet current compliance requirements. Carol Ada, MD Carol Ada, MD 07/13/2019 8:37:41 AM This report has been signed electronically. Number of Addenda:  0

## 2019-07-13 NOTE — H&P (Signed)
  Victor Medina HPI: At this time the patient denies any problems with nausea, vomiting, fevers, chills, abdominal pain, diarrhea, constipation, hematochezia, melena, GERD, or dysphagia. The patient denies any known family history of colon cancers. No complaints of chest pain, SOB, MI, or sleep apnea. He had a colonoscopy with Dr. Michail Sermon on 07/18/2014 with findings of at least 5 adenomas.  Past Medical History:  Diagnosis Date  . Asthma   . Cancer (HCC)    mild skin  . Chronic diastolic CHF (congestive heart failure) ([place])   . Morbid obesity ([place])   . OSA (obstructive sleep apnea)   . PAF (paroxysmal atrial fibrillation) ([place]) 08/14/2015   s/p TEE/DCCV to NSR;  denies palplita   . RBBB    atrial fib on cardizem    Past Surgical History:  Procedure Laterality Date  . CARDIOVERSION N/A 05/19/2015   Procedure: CARDIOVERSION;  Surgeon: Fay Records, MD;  Location: [facility] ENDOSCOPY;  Service: Cardiovascular;  Laterality: N/A;  . CHOLECYSTECTOMY N/A 12/01/2012   Procedure: LAPAROSCOPIC CHOLECYSTECTOMY;  Surgeon: Gwenyth Ober, MD;  Location: [place];  Service: General;  Laterality: N/A;  . MASS EXCISION Right 02/20/2018   Procedure: RIGHT THUMB EXCISION MASS AND DEBRIDEMENT DISTAL INTRPHLANGEAL JOINT;  Surgeon: Leanora Cover, MD;  Location: [place];  Service: Orthopedics;  Laterality: Right;  . right knee     meniscus removal 6 years ago  . SHOULDER SURGERY    . TEE WITHOUT CARDIOVERSION N/A 05/19/2015   Procedure: TRANSESOPHAGEAL ECHOCARDIOGRAM (TEE);  Surgeon: Fay Records, MD;  Location: [facility] ENDOSCOPY;  Service: Cardiovascular;  Laterality: N/A;    Family History  Problem Relation Age of Onset  . Hypotension Mother   . Heart disease Sister     Social History:  reports that he has quit smoking. He quit after 20.00 years of use. He has never used smokeless tobacco. He reports current alcohol use. He reports that he does not use drugs.  Allergies: No Known  Allergies  Medications:  Scheduled:  Continuous: . lactated ringers 20 mL/hr at 07/13/19 0720    No results found for this or any previous visit (from the past 24 hour(s)).   No results found.  ROS:  As stated above in the HPI otherwise negative.  Blood pressure 139/85, pulse (!) 110, temperature 97.7 F (36.5 C), temperature source Oral, resp. rate (!) 25, height 6\' 1"  (1.854 m), weight (!) 158.8 kg, SpO2 100 %.    PE: Gen: NAD, Alert and Oriented HEENT:  [place]/AT, EOMI Neck: Supple, no LAD Lungs: CTA Bilaterally CV: RRR without M/G/R ABM: Soft, NTND, +BS, obese Ext: No C/C/E  Assessment/Plan: 1) Personal history of polyps - colonoscopy.  Kristy Catoe D 07/13/2019, 7:25 AM

## 2019-07-13 NOTE — Discharge Instructions (Signed)

## 2019-07-13 NOTE — Transfer of Care (Signed)
Immediate Anesthesia Transfer of Care Note  Patient: Victor Medina  Procedure(s) Performed: COLONOSCOPY WITH PROPOFOL (N/A ) POLYPECTOMY  Patient Location: PACU and Endoscopy Unit  Anesthesia Type:MAC  Level of Consciousness: awake, alert , oriented and patient cooperative  Airway & Oxygen Therapy: Patient Spontanous Breathing and Patient connected to face mask oxygen  Post-op Assessment: Report given to RN and Post -op Vital signs reviewed and stable  Post vital signs: Reviewed and stable  Last Vitals:  Vitals Value Taken Time  BP 114/71 07/13/19 0840  Temp 36.7 C 07/13/19 0839  Pulse 100 07/13/19 0844  Resp 17 07/13/19 0844  SpO2 97 % 07/13/19 0844  Vitals shown include unvalidated device data.  Last Pain:  Vitals:   07/13/19 0839  TempSrc: Oral  PainSc: 0-No pain         Complications: No apparent anesthesia complications

## 2019-07-16 LAB — SURGICAL PATHOLOGY

## 2019-07-20 DIAGNOSIS — M25462 Effusion, left knee: Secondary | ICD-10-CM | POA: Diagnosis not present

## 2019-11-13 DIAGNOSIS — M2550 Pain in unspecified joint: Secondary | ICD-10-CM | POA: Diagnosis not present

## 2019-11-13 DIAGNOSIS — J4540 Moderate persistent asthma, uncomplicated: Secondary | ICD-10-CM | POA: Diagnosis not present

## 2019-11-13 DIAGNOSIS — R739 Hyperglycemia, unspecified: Secondary | ICD-10-CM | POA: Diagnosis not present

## 2019-11-13 DIAGNOSIS — I10 Essential (primary) hypertension: Secondary | ICD-10-CM | POA: Diagnosis not present

## 2019-11-13 DIAGNOSIS — N401 Enlarged prostate with lower urinary tract symptoms: Secondary | ICD-10-CM | POA: Diagnosis not present

## 2019-11-13 DIAGNOSIS — Z0000 Encounter for general adult medical examination without abnormal findings: Secondary | ICD-10-CM | POA: Diagnosis not present

## 2019-11-13 DIAGNOSIS — G4733 Obstructive sleep apnea (adult) (pediatric): Secondary | ICD-10-CM | POA: Diagnosis not present

## 2019-11-13 DIAGNOSIS — R2232 Localized swelling, mass and lump, left upper limb: Secondary | ICD-10-CM | POA: Diagnosis not present

## 2019-11-13 DIAGNOSIS — E782 Mixed hyperlipidemia: Secondary | ICD-10-CM | POA: Diagnosis not present

## 2019-11-13 DIAGNOSIS — Z6841 Body Mass Index (BMI) 40.0 and over, adult: Secondary | ICD-10-CM | POA: Diagnosis not present

## 2019-11-21 DIAGNOSIS — L723 Sebaceous cyst: Secondary | ICD-10-CM | POA: Diagnosis not present

## 2019-12-27 DIAGNOSIS — H25013 Cortical age-related cataract, bilateral: Secondary | ICD-10-CM | POA: Diagnosis not present

## 2019-12-27 DIAGNOSIS — H35033 Hypertensive retinopathy, bilateral: Secondary | ICD-10-CM | POA: Diagnosis not present

## 2019-12-27 DIAGNOSIS — H524 Presbyopia: Secondary | ICD-10-CM | POA: Diagnosis not present

## 2019-12-27 DIAGNOSIS — H2513 Age-related nuclear cataract, bilateral: Secondary | ICD-10-CM | POA: Diagnosis not present

## 2019-12-27 DIAGNOSIS — H40013 Open angle with borderline findings, low risk, bilateral: Secondary | ICD-10-CM | POA: Diagnosis not present

## 2020-04-15 DIAGNOSIS — R69 Illness, unspecified: Secondary | ICD-10-CM | POA: Diagnosis not present

## 2020-06-05 DIAGNOSIS — J069 Acute upper respiratory infection, unspecified: Secondary | ICD-10-CM | POA: Diagnosis not present

## 2020-07-12 DIAGNOSIS — R69 Illness, unspecified: Secondary | ICD-10-CM | POA: Diagnosis not present

## 2020-07-16 NOTE — Progress Notes (Signed)
Cardiology Office Note   Date:  07/18/2020   ID:  Victor Medina, DOB 12/29/1945, MRN [id number]  PCP:  Christain Sacramento, MD  Cardiologist:  Dr. Radford Pax, MD   Chief Complaint  Patient presents with  . Follow-up     History of Present Illness: Victor Medina is a 74 y.o. male who presents for 1 year follow-up, seen by Dr. Radford Pax.  [name] has a history of PAF s/p cardioversion in 2016, mildly dilated aortic root, chronic diastolic CHF, chronic LE edema, RBBB, OSA on CPAP.  He underwent an echocardiogram 09/2016 with normal LVEF with normal aortic root.  He was initially placed on Xarelto for anticoagulation given a CHA2DS2-VASc score of 2.  He remained on Xarelto for 1 month and self discontinued thereafter with further refusal to restart.  He was last seen for follow-up 09/20/2018 at which time he was doing well from a CV standpoint with no specific complaints.  Today he states he has been doing well from a CV standpoint.  Does have some complaints of lower extremity edema for which he has been taking both Lasix 20 mg in the a.m. and HCTZ 25 mg in the p.m. with relief.  He feels he responds well to HCTZ therefore he would wish to come off the Lasix 20 mg.  We discussed adding compression stockings and elevation of his legs while sitting.  He denies palpitations or symptoms of recurrent atrial fibrillation.  He is not anticoagulated per patient preference.  Since his last visit, he states he began having joint pain for which she contributed to his atorvastatin therefore he stopped this as well.  We discussed performing lab work today with possible lipid clinic referral for possible PCSK9 inhibitor.  He denies chest pain, shortness of breath, palpitations, dizziness, orthopnea or syncope.  BP noted to be soft today however as above, no symptoms.  Past Medical History:  Diagnosis Date  . Asthma   . Cancer (HCC)    mild skin  . Chronic diastolic CHF (congestive heart failure)  ([place])   . Morbid obesity ([place])   . OSA (obstructive sleep apnea)   . PAF (paroxysmal atrial fibrillation) ([place]) 08/14/2015   s/p TEE/DCCV to NSR;  denies palplita   . RBBB    atrial fib on cardizem    Past Surgical History:  Procedure Laterality Date  . CARDIOVERSION N/A 05/19/2015   Procedure: CARDIOVERSION;  Surgeon: Fay Records, MD;  Location: [facility] ENDOSCOPY;  Service: Cardiovascular;  Laterality: N/A;  . CHOLECYSTECTOMY N/A 12/01/2012   Procedure: LAPAROSCOPIC CHOLECYSTECTOMY;  Surgeon: Gwenyth Ober, MD;  Location: [place];  Service: General;  Laterality: N/A;  . COLONOSCOPY WITH PROPOFOL N/A 07/13/2019   Procedure: COLONOSCOPY WITH PROPOFOL;  Surgeon: Carol Ada, MD;  Location: [facility] ENDOSCOPY;  Service: Endoscopy;  Laterality: N/A;  . MASS EXCISION Right 02/20/2018   Procedure: RIGHT THUMB EXCISION MASS AND DEBRIDEMENT DISTAL INTRPHLANGEAL JOINT;  Surgeon: Leanora Cover, MD;  Location: [place];  Service: Orthopedics;  Laterality: Right;  . POLYPECTOMY  07/13/2019   Procedure: POLYPECTOMY;  Surgeon: Carol Ada, MD;  Location: [facility] ENDOSCOPY;  Service: Endoscopy;;  . right knee     meniscus removal 6 years ago  . SHOULDER SURGERY    . TEE WITHOUT CARDIOVERSION N/A 05/19/2015   Procedure: TRANSESOPHAGEAL ECHOCARDIOGRAM (TEE);  Surgeon: Fay Records, MD;  Location: [facility] ENDOSCOPY;  Service: Cardiovascular;  Laterality: N/A;     Current Outpatient Medications  Medication Sig Dispense Refill  . albuterol (PROVENTIL HFA;VENTOLIN HFA) 108 (90 BASE) MCG/ACT inhaler Inhale 2 puffs into the lungs every 4 (four) hours as needed for wheezing or shortness of breath.    Marland Kitchen amitriptyline (ELAVIL) 50 MG tablet Take 50 mg by mouth at bedtime.    Marland Kitchen aspirin EC 81 MG tablet Take 81 mg by mouth daily.    Marland Kitchen atorvastatin (LIPITOR) 40 MG tablet Take 40 mg by mouth every evening.     . Cholecalciferol (VITAMIN D3) 2000 UNITS TABS Take 4,000 Units by mouth daily.     Marland Kitchen diltiazem (TIAZAC) 120  MG 24 hr capsule Take 1 capsule by mouth daily.    . fluticasone (FLONASE) 50 MCG/ACT nasal spray Place 1 spray into both nostrils daily as needed for allergies or rhinitis.    . Fluticasone-Salmeterol (ADVAIR) 100-50 MCG/DOSE AEPB Inhale 1 puff into the lungs 2 (two) times daily.    . furosemide (LASIX) 20 MG tablet Take 1 tablet by mouth daily.    . hydrochlorothiazide (HYDRODIURIL) 25 MG tablet Take 1 tablet (25 mg total) by mouth daily. 90 tablet 1  . loratadine (CLARITIN) 10 MG tablet Take 10 mg by mouth daily.    . metoprolol tartrate (LOPRESSOR) 25 MG tablet Take 1 tablet (25 mg total) by mouth 2 (two) times daily. 180 tablet 1  . Omega-3 Fatty Acids (FISH OIL) 1200 MG CAPS Take 1,200 mg by mouth daily.    . tamsulosin (FLOMAX) 0.4 MG CAPS capsule Take 0.4 mg by mouth daily after supper.     . trolamine salicylate (ASPERCREME) 10 % cream Apply 1 application topically as needed for muscle pain.     No current facility-administered medications for this visit.    Allergies:   Patient has no known allergies.    Social History:  The patient  reports that he has quit smoking. He quit after 20.00 years of use. He has never used smokeless tobacco. He reports current alcohol use. He reports that he does not use drugs.   Family History:  The patient's family history includes Heart disease in his sister; Hypotension in his mother.    ROS:  Please see the history of present illness.   Otherwise, review of systems are positive for none.   All other systems are reviewed and negative.    PHYSICAL EXAM: VS:  BP 102/66   Pulse 80   Ht 6\' 1"  (1.854 m)   Wt (!) 354 lb 12.8 oz (160.9 kg)   SpO2 96%   BMI 46.81 kg/m  , BMI Body mass index is 46.81 kg/m.   General: Obese,  NAD Neck: Negative for carotid bruits. No JVD Lungs:Clear to ausculation bilaterally. Cardiovascular: RRR with S1 S2. No murmurs Extremities: 1+ BLE edema. Neuro: Alert and oriented. No focal deficits. No facial asymmetry.  MAE spontaneously. Psych: Responds to questions appropriately with normal affect.     EKG:  EKG is ordered today. The ekg ordered today demonstrates NSR with HR 80bpm with RBBB, no acute change from prior   Recent Labs: No results found for requested labs within last 8760 hours.    Lipid Panel No results found for: CHOL, TRIG, HDL, CHOLHDL, VLDL, LDLCALC, LDLDIRECT    Wt Readings from Last 3 Encounters:  07/18/20 (!) 354 lb 12.8 oz (160.9 kg)  07/13/19 (!) 350 lb (158.8 kg)  09/20/18 (!) 357 lb 1.9 oz (162 kg)    ASSESSMENT AND PLAN:  1.  PAF: -Underwent DCCV in 2016  and was initially placed on Xarelto however self discontinued anticoagulation after 1 month of therapy with further refusal -No recurrent atrial fibrillation -EKG today with NSR  2.  Chronic diastolic CHF: -Last echocardiogram 09/2016 with normal LVEF at 60 to 65% with G1 DD -Has known chronic lower extremity edema which has recently increased more so with prolonged ambulation that resolves overnight -He reports he has been taking Lasix in the a.m. and HCTZ in the p.m. but feels he is more responsive to HCTZ therefore will discontinue the Lasix at this time and have him only on HCTZ.  May need to uptitrated next OV.  He is to call if symptoms do not improve. -Obtain lab work today  3.  Hypertension: -Soft, 102/66 -Denies dizziness or other orthostatic symptoms -Continue diltiazem, metoprolol  4.  OSA: -Compliant with CPAP  5.  HLD: -States he began having upper extremity joint pain therefore he stopped atorvastatin with symptom resolution  -Obtain lipid panel today  -If elevated, could consider referral to lipid clinic for possible PCSK9 inhibitor   6.  Morbid obesity: -Stressed the importance of exercise and weight loss at last OV -Weigt>>350lb 09/2018   Current medicines are reviewed at length with the patient today.  The patient does not have concerns regarding medicines.  The following changes have  been made: Start Lasix 20 mg p.o. daily, start HCTZ 25 mg p.o. daily  Labs/ tests ordered today include: Lipid panel, BMET No orders of the defined types were placed in this encounter.    Disposition:   FU with Dr. Radford Pax in 6 months  Signed, Kathyrn Drown, NP  07/18/2020 10:41 AM    [address], [place],   [address] Phone: [phone]; Fax: [phone]

## 2020-07-18 ENCOUNTER — Ambulatory Visit: Payer: Medicare HMO | Admitting: Cardiology

## 2020-07-18 ENCOUNTER — Encounter: Payer: Self-pay | Admitting: Cardiology

## 2020-07-18 ENCOUNTER — Other Ambulatory Visit: Payer: Self-pay

## 2020-07-18 VITALS — BP 102/66 | HR 80 | Ht 73.0 in | Wt 354.8 lb

## 2020-07-18 DIAGNOSIS — E785 Hyperlipidemia, unspecified: Secondary | ICD-10-CM | POA: Diagnosis not present

## 2020-07-18 DIAGNOSIS — I480 Paroxysmal atrial fibrillation: Secondary | ICD-10-CM | POA: Diagnosis not present

## 2020-07-18 DIAGNOSIS — I5032 Chronic diastolic (congestive) heart failure: Secondary | ICD-10-CM | POA: Diagnosis not present

## 2020-07-18 DIAGNOSIS — I10 Essential (primary) hypertension: Secondary | ICD-10-CM

## 2020-07-18 LAB — BASIC METABOLIC PANEL
BUN/Creatinine Ratio: 17 (ref 10–24)
BUN: 21 mg/dL (ref 8–27)
CO2: 26 mmol/L (ref 20–29)
Calcium: 9.6 mg/dL (ref 8.6–10.2)
Chloride: 100 mmol/L (ref 96–106)
Creatinine, Ser: 1.25 mg/dL (ref 0.76–1.27)
GFR calc Af Amer: 65 mL/min/{1.73_m2} (ref >59)
GFR calc non Af Amer: 56 mL/min/{1.73_m2} — ABNORMAL LOW (ref >59)
Glucose: 90 mg/dL (ref 65–99)
Potassium: 4.5 mmol/L (ref 3.5–5.2)
Sodium: 139 mmol/L (ref 134–144)

## 2020-07-18 LAB — LIPID PANEL
Chol/HDL Ratio: 6.2 ratio — ABNORMAL HIGH (ref 0.0–5.0)
Cholesterol, Total: 234 mg/dL — ABNORMAL HIGH (ref 100–199)
HDL: 38 mg/dL — ABNORMAL LOW (ref >39)
LDL Chol Calc (NIH): 157 mg/dL — ABNORMAL HIGH (ref 0–99)
Triglycerides: 214 mg/dL — ABNORMAL HIGH (ref 0–149)
VLDL Cholesterol Cal: 39 mg/dL (ref 5–40)

## 2020-07-18 NOTE — Patient Instructions (Signed)
Medication Instructions:  Your physician has recommended you make the following change in your medication:  1. STOP LASIX 20 MG DAILY.  2. KEEP TAKING HYDROCHLOROTHIAZIDE 25  MG DAILY.   *If you need a refill on your cardiac medications before your next appointment, please call your pharmacy*   Lab Work: TODAY: BMET, LIPIDS  If you have labs (blood work) drawn today and your tests are completely normal, you will receive your results only by: Marland Kitchen MyChart Message (if you have MyChart) OR . A paper copy in the mail If you have any lab test that is abnormal or we need to change your treatment, we will call you to review the results.   Testing/Procedures: NONE   Follow-Up: At [facility], you and your health needs are our priority.  As part of our continuing mission to provide you with exceptional heart care, we have created designated Provider Care Teams.  These Care Teams include your primary Cardiologist (physician) and Advanced Practice Providers (APPs -  Physician Assistants and Nurse Practitioners) who all work together to provide you with the care you need, when you need it.  We recommend signing up for the patient portal called "MyChart".  Sign up information is provided on this After Visit Summary.  MyChart is used to connect with patients for Virtual Visits (Telemedicine).  Patients are able to view lab/test results, encounter notes, upcoming appointments, etc.  Non-urgent messages can be sent to your provider as well.   To learn more about what you can do with MyChart, go to [email or web address].    Your next appointment:   6 month(s)  The format for your next appointment:   In Person  Provider:   You may see Fransico Him, MD or one of the following Advanced Practice Providers on your designated Care Team:    Melina Copa, PA-C  Ermalinda Barrios, PA-C

## 2020-07-25 DIAGNOSIS — H938X1 Other specified disorders of right ear: Secondary | ICD-10-CM | POA: Diagnosis not present

## 2020-07-25 DIAGNOSIS — H9191 Unspecified hearing loss, right ear: Secondary | ICD-10-CM | POA: Diagnosis not present

## 2020-08-05 DIAGNOSIS — H9193 Unspecified hearing loss, bilateral: Secondary | ICD-10-CM | POA: Diagnosis not present

## 2020-08-05 DIAGNOSIS — H903 Sensorineural hearing loss, bilateral: Secondary | ICD-10-CM | POA: Diagnosis not present

## 2020-08-06 ENCOUNTER — Telehealth: Payer: Self-pay

## 2020-08-06 NOTE — Telephone Encounter (Signed)
I left a message for the patient to return my call.

## 2020-08-06 NOTE — Telephone Encounter (Signed)
-----   Message from Jill D McDaniel, NP sent at 07/21/2020  5:49 PM EST ----- Victor Medina needs a referral to the lipid clinic for elevated cholesterol on last lipid panel.  He has known statin intolerance.  With his total cholesterol 234 and LDL at 157 along with elevated triglycerides, he would be an ideal candidate for PCSK9 inhibitor which we discussed during our office visit   

## 2020-08-07 NOTE — Telephone Encounter (Signed)
Reviewed results with patient who verbalized understanding. Per Georgie Chard, pt to be referred to the lipid clinic due to statin in tolerance. Pt states that he PCP put him back on atorvastatin and he would like to try that out first before going to the lipid clinic. Pt thanked me for the call.

## 2020-08-07 NOTE — Telephone Encounter (Signed)
-----   Message from Filbert Schilder, NP sent at 07/21/2020  5:49 PM EST ----- Victor Medina needs a referral to the lipid clinic for elevated cholesterol on last lipid panel.  He has known statin intolerance.  With his total cholesterol 234 and LDL at 157 along with elevated triglycerides, he would be an ideal candidate for PCSK9 inhibitor which we discussed during our office visit

## 2020-12-29 DIAGNOSIS — H25013 Cortical age-related cataract, bilateral: Secondary | ICD-10-CM | POA: Diagnosis not present

## 2020-12-29 DIAGNOSIS — H2513 Age-related nuclear cataract, bilateral: Secondary | ICD-10-CM | POA: Diagnosis not present

## 2020-12-29 DIAGNOSIS — H40013 Open angle with borderline findings, low risk, bilateral: Secondary | ICD-10-CM | POA: Diagnosis not present

## 2020-12-29 DIAGNOSIS — H524 Presbyopia: Secondary | ICD-10-CM | POA: Diagnosis not present

## 2021-01-20 ENCOUNTER — Ambulatory Visit: Payer: Medicare HMO | Admitting: Cardiology

## 2021-01-20 ENCOUNTER — Other Ambulatory Visit: Payer: Self-pay

## 2021-01-20 ENCOUNTER — Encounter: Payer: Self-pay | Admitting: Cardiology

## 2021-01-20 VITALS — BP 117/67 | HR 73 | Ht 73.0 in | Wt 349.4 lb

## 2021-01-20 DIAGNOSIS — I77810 Thoracic aortic ectasia: Secondary | ICD-10-CM

## 2021-01-20 DIAGNOSIS — I480 Paroxysmal atrial fibrillation: Secondary | ICD-10-CM | POA: Diagnosis not present

## 2021-01-20 DIAGNOSIS — I10 Essential (primary) hypertension: Secondary | ICD-10-CM | POA: Diagnosis not present

## 2021-01-20 DIAGNOSIS — G4733 Obstructive sleep apnea (adult) (pediatric): Secondary | ICD-10-CM

## 2021-01-20 DIAGNOSIS — I5032 Chronic diastolic (congestive) heart failure: Secondary | ICD-10-CM | POA: Diagnosis not present

## 2021-01-20 DIAGNOSIS — E7800 Pure hypercholesterolemia, unspecified: Secondary | ICD-10-CM | POA: Diagnosis not present

## 2021-01-20 MED ORDER — DILTIAZEM HCL ER BEADS 120 MG PO CP24: 120.0000 mg | ORAL_CAPSULE | Freq: Every day | ORAL | 3 refills | Status: DC

## 2021-01-20 MED ORDER — METOPROLOL TARTRATE 25 MG PO TABS: 25.0000 mg | ORAL_TABLET | Freq: Two times a day (BID) | ORAL | 3 refills | Status: DC

## 2021-01-20 MED ORDER — ATORVASTATIN CALCIUM 20 MG PO TABS: 20.0000 mg | ORAL_TABLET | Freq: Every day | ORAL | 3 refills | Status: DC

## 2021-01-20 MED ORDER — HYDROCHLOROTHIAZIDE 25 MG PO TABS: 25.0000 mg | ORAL_TABLET | Freq: Every day | ORAL | 3 refills | Status: DC

## 2021-01-20 NOTE — Addendum Note (Signed)
Addended by: Antonieta Iba on: 01/20/2021 11:26 AM   Modules accepted: Orders

## 2021-01-20 NOTE — Patient Instructions (Signed)
Medication Instructions:  Your physician recommends that you continue on your current medications as directed. Please refer to the Current Medication list given to you today.  *If you need a refill on your cardiac medications before your next appointment, please call your pharmacy*   Lab Work: Fasting lipids and CMET  If you have labs (blood work) drawn today and your tests are completely normal, you will receive your results only by: [place] (if you have MyChart) OR A paper copy in the mail If you have any lab test that is abnormal or we need to change your treatment, we will call you to review the results.   Testing/Procedures: Your physician has requested that you have an echocardiogram. Echocardiography is a painless test that uses sound waves to create images of your heart. It provides your doctor with information about the size and shape of your heart and how well your heart's chambers and valves are working. This procedure takes approximately one hour. There are no restrictions for this procedure.  Follow-Up: At [facility], you and your health needs are our priority.  As part of our continuing mission to provide you with exceptional heart care, we have created designated Provider Care Teams.  These Care Teams include your primary Cardiologist (physician) and Advanced Practice Providers (APPs -  Physician Assistants and Nurse Practitioners) who all work together to provide you with the care you need, when you need it.   Your next appointment:   1 year(s)  The format for your next appointment:   In Person  Provider:   You may see Fransico Him, MD or one of the following Advanced Practice Providers on your designated Care Team:   Melina Copa, PA-C Ermalinda Barrios, PA-C

## 2021-01-20 NOTE — Progress Notes (Signed)
Cardiology Office Note   Date:  01/20/2021   ID:  Victor Medina, DOB [birth year], MRN [id number]  PCP:  Christain Sacramento, MD  Cardiologist:  Dr. Radford Pax, MD   Chief Complaint  Patient presents with   Atrial Fibrillation   Congestive Heart Failure   Hypertension   Hyperlipidemia      History of Present Illness: Victor Medina is a 75 y.o. male with a history of PAF s/p cardioversion in 2016, mildly dilated aortic root, chronic diastolic CHF, chronic LE edema, RBBB, OSA on CPAP.  He underwent an echocardiogram 09/2016 with normal LVEF with normal aortic root.  He was initially placed on Xarelto for anticoagulation given a CHA2DS2-VASc score of 2.  He remained on Xarelto for 1 month and self discontinued thereafter with further refusal to restart.  He is here today for followup and is doing well.  He denies any chest pain or pressure, SOB, DOE, PND, orthopnea, dizziness, palpitations or syncope. He has chronic LE edema which is unchanged. He is compliant with his meds and is tolerating meds with no SE.   He exercises at the gym and rides a stationary bike for 10 minutes.   He is doing well with his CPAP device and thinks that he has gotten used to it.  He tolerates the mask and feels the pressure is adequate.  He wakes up a lot at night due to hip pain so he does not feel rested when he gets up in the am but does not nap during the day.   He denies any significant mouth or nasal dryness or nasal congestion.  He does not think that he snores.     Past Medical History:  Diagnosis Date   Asthma    Cancer ([place])    mild skin   Chronic diastolic CHF (congestive heart failure) (HCC)    Morbid obesity (HCC)    OSA (obstructive sleep apnea)    PAF (paroxysmal atrial fibrillation) ([place]) 08/14/2015   s/p TEE/DCCV to NSR;  denies palplita    RBBB    atrial fib on cardizem    Past Surgical History:  Procedure Laterality Date   CARDIOVERSION N/A 05/19/2015   Procedure:  CARDIOVERSION;  Surgeon: Fay Records, MD;  Location: [facility] ENDOSCOPY;  Service: Cardiovascular;  Laterality: N/A;   CHOLECYSTECTOMY N/A 12/01/2012   Procedure: LAPAROSCOPIC CHOLECYSTECTOMY;  Surgeon: Gwenyth Ober, MD;  Location: [place];  Service: General;  Laterality: N/A;   COLONOSCOPY WITH PROPOFOL N/A 07/13/2019   Procedure: COLONOSCOPY WITH PROPOFOL;  Surgeon: Carol Ada, MD;  Location: [facility] ENDOSCOPY;  Service: Endoscopy;  Laterality: N/A;   MASS EXCISION Right 02/20/2018   Procedure: RIGHT THUMB EXCISION MASS AND DEBRIDEMENT DISTAL INTRPHLANGEAL JOINT;  Surgeon: Leanora Cover, MD;  Location: [place];  Service: Orthopedics;  Laterality: Right;   POLYPECTOMY  07/13/2019   Procedure: POLYPECTOMY;  Surgeon: Carol Ada, MD;  Location: [facility] ENDOSCOPY;  Service: Endoscopy;;   right knee     meniscus removal 6 years ago   SHOULDER SURGERY     TEE WITHOUT CARDIOVERSION N/A 05/19/2015   Procedure: TRANSESOPHAGEAL ECHOCARDIOGRAM (TEE);  Surgeon: Fay Records, MD;  Location: [facility] ENDOSCOPY;  Service: Cardiovascular;  Laterality: N/A;     Current Outpatient Medications  Medication Sig Dispense Refill   aspirin EC 81 MG tablet Take 81 mg by mouth daily.     atorvastatin (LIPITOR) 40 MG tablet Take 20 mg by mouth every evening.  Cholecalciferol (VITAMIN D3) 2000 UNITS TABS Take 4,000 Units by mouth daily.      diltiazem (TIAZAC) 120 MG 24 hr capsule Take 1 capsule by mouth daily.     fluticasone (FLONASE) 50 MCG/ACT nasal spray Place 1 spray into both nostrils daily as needed for allergies or rhinitis.     Fluticasone-Salmeterol (ADVAIR) 100-50 MCG/DOSE AEPB Inhale 1 puff into the lungs 2 (two) times daily.     hydrochlorothiazide (HYDRODIURIL) 25 MG tablet Take 1 tablet (25 mg total) by mouth daily. 90 tablet 1   loratadine (CLARITIN) 10 MG tablet Take 10 mg by mouth daily.     metoprolol tartrate (LOPRESSOR) 25 MG tablet Take 1 tablet (25 mg total) by mouth 2 (two) times daily. 180  tablet 1   Omega-3 Fatty Acids (FISH OIL) 1200 MG CAPS Take 1,200 mg by mouth daily.     tamsulosin (FLOMAX) 0.4 MG CAPS capsule Take 0.4 mg by mouth daily after supper.      trolamine salicylate (ASPERCREME) 10 % cream Apply 1 application topically as needed for muscle pain.     No current facility-administered medications for this visit.    Allergies:   Patient has no known allergies.    Social History:  The patient  reports that he has quit smoking. His smoking use included cigarettes. He has never used smokeless tobacco. He reports current alcohol use. He reports that he does not use drugs.   Family History:  The patient's family history includes Heart disease in his sister; Hypotension in his mother.    ROS:  Please see the history of present illness.   Otherwise, review of systems are positive for none.   All other systems are reviewed and negative.    PHYSICAL EXAM: VS:  BP 117/67   Pulse 73   Ht 6\' 1"  (1.854 m)   Wt (!) 349 lb 6.4 oz (158.5 kg)   SpO2 97%   BMI 46.10 kg/m  , BMI Body mass index is 46.1 kg/m.   GEN: Well nourished, well developed in no acute distress HEENT: Normal NECK: No JVD; No carotid bruits LYMPHATICS: No lymphadenopathy CARDIAC:RRR, no murmurs, rubs, gallops RESPIRATORY:  Clear to auscultation without rales, wheezing or rhonchi  ABDOMEN: Soft, non-tender, non-distended MUSCULOSKELETAL:  No edema; No deformity  SKIN: Warm and dry NEUROLOGIC:  Alert and oriented x 3 PSYCHIATRIC:  Normal affect    EKG:  EKG is not ordered today.  Recent Labs: 07/18/2020: BUN 21; Creatinine, Ser 1.25; Potassium 4.5; Sodium 139   Lipid Panel    Component Value Date/Time   CHOL 234 (H) 07/18/2020 1056   TRIG 214 (H) 07/18/2020 1056   HDL 38 (L) 07/18/2020 1056   CHOLHDL 6.2 (H) 07/18/2020 1056   LDLCALC 157 (H) 07/18/2020 1056      Wt Readings from Last 3 Encounters:  01/20/21 (!) 349 lb 6.4 oz (158.5 kg)  07/18/20 (!) 354 lb 12.8 oz (160.9 kg)   07/13/19 (!) 350 lb (158.8 kg)    ASSESSMENT AND PLAN:  1.  PAF: -Underwent DCCV in 2016 and was initially placed on Xarelto however self discontinued anticoagulation after 1 month of therapy with further refusal -he is maintaining NSR on exam and denies any palpitations -Continue prescription drug management with Cardizem CD 120mg  daily, Lopressor 25mg  BID and HCTZ 25mg  daily>>refills given for 1 year  -I have personally reviewed and interpreted outside labs performed by patient's PCP which showed SCr 1.25 and K+ 4.5 in Dec 2021  2.  Chronic diastolic CHF: -Last echocardiogram 09/2016 with normal LVEF at 60 to 65% with G1 DD -Has known chronic lower extremity edema which has veen stable -Continue prescription drug management with HCTZ 25mg  daily (was more responsive to HCTZ compared to Lasix) -check BMET   3.  Hypertension: -Bp is adequately controlled on exam today -Continue prescription drug management with Cardizem CD 120mg  daily, Loprsesor 25mg  BID and Cardizem CD 120mg  daily   4.  OSA - The patient is tolerating PAP therapy well without any problems. The PAP download performed by his DME was personally reviewed and interpreted by me today and showed an AHI of 4.5/hr on auto PAP with 100% compliance in using more than 4 hours nightly.  The patient has been using and benefiting from PAP use and will continue to benefit from therapy.    5.  HLD: -LDL goal < 100 -I have personally reviewed and interpreted outside labs performed by patient's PCP which showed LDL 157, HDL 38, TAG 214 in Dec 2021 -Continue prescription drug management with Atorvastatin 20mg  daily >> does not tolerate higher doses -repeat FLP and ALT    6.  Dilated aortic root -aortic root measured 4mm on echo 2018 -repeat echo to reassess   Current medicines are reviewed at length with the patient today.  The patient does not have concerns regarding medicines.   Labs/ tests ordered today include: Lipid panel,  CMET and 2D echo No orders of the defined types were placed in this encounter.    Disposition:   FU with me in 1 year  Signed, Fransico Him, MD  01/20/2021 11:13 AM    [facility] [place], [place], [place]  [address] Phone: [phone]; Fax: [phone]

## 2021-01-30 ENCOUNTER — Telehealth: Payer: Self-pay | Admitting: *Deleted

## 2021-01-30 NOTE — Telephone Encounter (Signed)
-----   Message from Sueanne Margarita, MD sent at 01/21/2021  3:26 PM EDT ----- Good AHI and compliance.  Continue current PAP settings.

## 2021-01-30 NOTE — Telephone Encounter (Signed)
The patient has been notified of the result and verbalized understanding.  All questions (if any) were answered. [name], [place] 01/30/2021 1:03 PM

## 2021-02-05 ENCOUNTER — Other Ambulatory Visit: Payer: Self-pay

## 2021-02-05 ENCOUNTER — Ambulatory Visit (HOSPITAL_COMMUNITY): Payer: Medicare HMO | Attending: Internal Medicine

## 2021-02-05 ENCOUNTER — Other Ambulatory Visit: Payer: Medicare HMO

## 2021-02-05 DIAGNOSIS — I77810 Thoracic aortic ectasia: Secondary | ICD-10-CM

## 2021-02-05 DIAGNOSIS — I480 Paroxysmal atrial fibrillation: Secondary | ICD-10-CM

## 2021-02-05 DIAGNOSIS — I10 Essential (primary) hypertension: Secondary | ICD-10-CM

## 2021-02-05 DIAGNOSIS — E7800 Pure hypercholesterolemia, unspecified: Secondary | ICD-10-CM | POA: Insufficient documentation

## 2021-02-05 DIAGNOSIS — I5032 Chronic diastolic (congestive) heart failure: Secondary | ICD-10-CM | POA: Diagnosis not present

## 2021-02-05 DIAGNOSIS — G4733 Obstructive sleep apnea (adult) (pediatric): Secondary | ICD-10-CM | POA: Insufficient documentation

## 2021-02-05 LAB — COMPREHENSIVE METABOLIC PANEL
ALT: 20 IU/L (ref 0–44)
AST: 23 IU/L (ref 0–40)
Albumin/Globulin Ratio: 2.4 — ABNORMAL HIGH (ref 1.2–2.2)
Albumin: 4.3 g/dL (ref 3.7–4.7)
Alkaline Phosphatase: 66 IU/L (ref 44–121)
BUN/Creatinine Ratio: 21 (ref 10–24)
BUN: 25 mg/dL (ref 8–27)
Bilirubin Total: 0.6 mg/dL (ref 0.0–1.2)
CO2: 23 mmol/L (ref 20–29)
Calcium: 9.4 mg/dL (ref 8.6–10.2)
Chloride: 103 mmol/L (ref 96–106)
Creatinine, Ser: 1.19 mg/dL (ref 0.76–1.27)
Globulin, Total: 1.8 g/dL (ref 1.5–4.5)
Glucose: 119 mg/dL — ABNORMAL HIGH (ref 65–99)
Potassium: 4 mmol/L (ref 3.5–5.2)
Sodium: 142 mmol/L (ref 134–144)
Total Protein: 6.1 g/dL (ref 6.0–8.5)
eGFR: 64 mL/min/{1.73_m2} (ref >59)

## 2021-02-05 LAB — LIPID PANEL
Chol/HDL Ratio: 3.4 ratio (ref 0.0–5.0)
Cholesterol, Total: 129 mg/dL (ref 100–199)
HDL: 38 mg/dL — ABNORMAL LOW (ref >39)
LDL Chol Calc (NIH): 72 mg/dL (ref 0–99)
Triglycerides: 102 mg/dL (ref 0–149)
VLDL Cholesterol Cal: 19 mg/dL (ref 5–40)

## 2021-02-05 LAB — ECHOCARDIOGRAM COMPLETE
Area-P 1/2: 3.12 cm2
S' Lateral: 3.6 cm

## 2021-02-05 MED ORDER — PERFLUTREN LIPID MICROSPHERE
1.0000 mL | INTRAVENOUS | Status: AC | PRN
  Administered 2021-02-05: 3 mL via INTRAVENOUS

## 2021-04-02 DIAGNOSIS — E782 Mixed hyperlipidemia: Secondary | ICD-10-CM | POA: Diagnosis not present

## 2021-04-02 DIAGNOSIS — I10 Essential (primary) hypertension: Secondary | ICD-10-CM | POA: Diagnosis not present

## 2021-04-02 DIAGNOSIS — Z6841 Body Mass Index (BMI) 40.0 and over, adult: Secondary | ICD-10-CM | POA: Diagnosis not present

## 2021-04-02 DIAGNOSIS — G4733 Obstructive sleep apnea (adult) (pediatric): Secondary | ICD-10-CM | POA: Diagnosis not present

## 2021-04-02 DIAGNOSIS — Z0000 Encounter for general adult medical examination without abnormal findings: Secondary | ICD-10-CM | POA: Diagnosis not present

## 2021-04-02 DIAGNOSIS — N401 Enlarged prostate with lower urinary tract symptoms: Secondary | ICD-10-CM | POA: Diagnosis not present

## 2021-04-02 DIAGNOSIS — R350 Frequency of micturition: Secondary | ICD-10-CM | POA: Diagnosis not present

## 2021-08-25 ENCOUNTER — Other Ambulatory Visit (HOSPITAL_BASED_OUTPATIENT_CLINIC_OR_DEPARTMENT_OTHER): Payer: Self-pay

## 2021-08-25 MED ORDER — FLUTICASONE-SALMETEROL 100-50 MCG/ACT IN AEPB: 2.0000 | INHALATION_SPRAY | Freq: Every day | RESPIRATORY_TRACT | 2 refills | Status: DC

## 2021-08-25 MED ORDER — ROSUVASTATIN CALCIUM 5 MG PO TABS: 5.0000 mg | ORAL_TABLET | Freq: Every day | ORAL | 2 refills | Status: DC

## 2021-08-25 MED ORDER — TAMSULOSIN HCL 0.4 MG PO CAPS: 0.4000 mg | ORAL_CAPSULE | ORAL | 1 refills | Status: DC

## 2021-08-25 MED ORDER — DILTIAZEM HCL ER COATED BEADS 120 MG PO CP24: 120.0000 mg | ORAL_CAPSULE | Freq: Every day | ORAL | 1 refills | Status: DC

## 2021-09-02 ENCOUNTER — Other Ambulatory Visit (HOSPITAL_BASED_OUTPATIENT_CLINIC_OR_DEPARTMENT_OTHER): Payer: Self-pay

## 2021-09-02 ENCOUNTER — Other Ambulatory Visit (HOSPITAL_COMMUNITY): Payer: Self-pay

## 2021-09-29 ENCOUNTER — Encounter (HOSPITAL_BASED_OUTPATIENT_CLINIC_OR_DEPARTMENT_OTHER): Payer: Self-pay | Admitting: Family Medicine

## 2021-09-29 ENCOUNTER — Other Ambulatory Visit: Payer: Self-pay

## 2021-09-29 ENCOUNTER — Ambulatory Visit (INDEPENDENT_AMBULATORY_CARE_PROVIDER_SITE_OTHER): Payer: Medicare HMO | Admitting: Family Medicine

## 2021-09-29 VITALS — BP 117/72 | HR 78 | Ht 73.0 in | Wt 346.3 lb

## 2021-09-29 DIAGNOSIS — I480 Paroxysmal atrial fibrillation: Secondary | ICD-10-CM

## 2021-09-29 DIAGNOSIS — G8929 Other chronic pain: Secondary | ICD-10-CM | POA: Diagnosis not present

## 2021-09-29 DIAGNOSIS — M25562 Pain in left knee: Secondary | ICD-10-CM | POA: Diagnosis not present

## 2021-09-29 DIAGNOSIS — Z0000 Encounter for general adult medical examination without abnormal findings: Secondary | ICD-10-CM

## 2021-09-29 DIAGNOSIS — I10 Essential (primary) hypertension: Secondary | ICD-10-CM | POA: Diagnosis not present

## 2021-09-29 DIAGNOSIS — M25561 Pain in right knee: Secondary | ICD-10-CM | POA: Insufficient documentation

## 2021-09-29 DIAGNOSIS — E7800 Pure hypercholesterolemia, unspecified: Secondary | ICD-10-CM

## 2021-09-29 NOTE — Assessment & Plan Note (Signed)
Chronic issue for patient, reports in the past he was following with orthopedic surgeon, has had prior meniscal injuries We will refer to orthopedic surgeon for further evaluation and management as per patient request

## 2021-09-29 NOTE — Assessment & Plan Note (Signed)
Blood pressure adequate in office today, no evidence of low blood pressure based on history Continue with current regimen of diltiazem, hydrochlorothiazide Recommend DASH diet, continue with regular exercise

## 2021-09-29 NOTE — Assessment & Plan Note (Signed)
At present, appears to be tolerating current statin well Continue with rosuvastatin 5 mg, plan to check lipid panel with upcoming wellness visit

## 2021-09-29 NOTE — Patient Instructions (Signed)
°  Medication Instructions:  Your physician recommends that you continue on your current medications as directed. Please refer to the Current Medication list given to you today. --If you need a refill on any your medications before your next appointment, please call your pharmacy first. If no refills are authorized on file call the office.-- Lab Work: Your physician has recommended that you have lab work today: 1 week prior to lab If you have labs (blood work) drawn today and your tests are completely normal, you will receive your results via [facility] a phone call from our staff.  Please ensure you check your voicemail in the event that you authorized detailed messages to be left on a delegated number. If you have any lab test that is abnormal or we need to change your treatment, we will call you to review the results.  Referrals/Procedures/Imaging Dr Jesusita Oka ( bilateral knee pain)  Follow-Up: Your next appointment:   Your physician recommends that you schedule a follow-up appointment in: 2-3  Month  with Dr. de [place]  You will receive a text message or e-mail with a link to a survey about your care and experience with [place] today! We would greatly appreciate your feedback!   Thanks for letting us be apart of your health journey!!  Primary Care and Sports Medicine   Dr. Arlina Robes [place]   We encourage you to activate your patient portal called "MyChart".  Sign up information is provided on this After Visit Summary.  MyChart is used to connect with patients for Virtual Visits (Telemedicine).  Patients are able to view lab/test results, encounter notes, upcoming appointments, etc.  Non-urgent messages can be sent to your provider as well. To learn more about what you can do with MyChart, please visit --  [email or web address].

## 2021-09-29 NOTE — Progress Notes (Signed)
New Patient Office Visit  Subjective:  Patient ID: Victor Medina, male    DOB: 1945/09/15  Age: 76 y.o. MRN: [id number]  CC:  Chief Complaint  Patient presents with   New Patient (Initial Visit)    Patient presents today to establish care. He has joined [facility] and has been building muscle, he would like approval to see the orthopedic surgeon DrBokshan.    HPI Victor Medina is a 76 year old male presenting to establish in clinic.  He has current concerns as outlined above.  Past medical history significant for paroxysmal atrial fibrillation, hyperlipidemia, hypertension, asthma.  Patient does follow with cardiology in regards to his hypertension, hyperlipidemia, paroxysmal atrial fibrillation.  Also has a history of CHF.  Current medications include ASA 81 mg, diltiazem, hydrochlorothiazide, metoprolol, rosuvastatin.  Was on Xarelto at 1 point in the past, however reportedly discontinued this on his own after being on it for 1 month, reason for discontinuation is unclear, however patient refused to resume.  Generally follows up with cardiology about once per year.  Last visit was June 2022  Patient has been trying to exercise more frequently.  He reports chronic issues with knee and hip pain.  Requesting referral to orthopedic surgeon for further evaluation.  Patient is originally from [place].  Has been living here since 1997.  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  Hobbies include skeet shooting and exercising.  Past Medical History:  Diagnosis Date   Asthma    Cancer ([place])    mild skin   Chronic diastolic CHF (congestive heart failure) (HCC)    Morbid obesity (HCC)    OSA (obstructive sleep apnea)    PAF (paroxysmal atrial fibrillation) ([place]) 08/14/2015   s/p TEE/DCCV to NSR;  denies palplita    RBBB    atrial fib on cardizem    Past Surgical History:  Procedure Laterality Date   CARDIOVERSION N/A 05/19/2015    Procedure: CARDIOVERSION;  Surgeon: Fay Records, MD;  Location: [facility] ENDOSCOPY;  Service: Cardiovascular;  Laterality: N/A;   CHOLECYSTECTOMY N/A 12/01/2012   Procedure: LAPAROSCOPIC CHOLECYSTECTOMY;  Surgeon: Gwenyth Ober, MD;  Location: [place];  Service: General;  Laterality: N/A;   COLONOSCOPY WITH PROPOFOL N/A 07/13/2019   Procedure: COLONOSCOPY WITH PROPOFOL;  Surgeon: Carol Ada, MD;  Location: [facility] ENDOSCOPY;  Service: Endoscopy;  Laterality: N/A;   MASS EXCISION Right 02/20/2018   Procedure: RIGHT THUMB EXCISION MASS AND DEBRIDEMENT DISTAL INTRPHLANGEAL JOINT;  Surgeon: Leanora Cover, MD;  Location: [place];  Service: Orthopedics;  Laterality: Right;   POLYPECTOMY  07/13/2019   Procedure: POLYPECTOMY;  Surgeon: Carol Ada, MD;  Location: [facility] ENDOSCOPY;  Service: Endoscopy;;   right knee     meniscus removal 6 years ago   SHOULDER SURGERY     TEE WITHOUT CARDIOVERSION N/A 05/19/2015   Procedure: TRANSESOPHAGEAL ECHOCARDIOGRAM (TEE);  Surgeon: Fay Records, MD;  Location: [facility] ENDOSCOPY;  Service: Cardiovascular;  Laterality: N/A;    Family History  Problem Relation Age of Onset   Hypotension Mother    Heart disease Sister     Social History   Socioeconomic History   Marital status: Married    Spouse name: Not on file   Number of children: Not on file   Years of education: Not on file   Highest education level: Not on file  Occupational History   Not on file  Tobacco Use   Smoking status: Former  Years: 20.00    Types: Cigarettes   Smokeless tobacco: Never  Vaping Use   Vaping Use: Never used  Substance and Sexual Activity   Alcohol use: Yes    Alcohol/week: 0.0 standard drinks    Comment: rarely   Drug use: No   Sexual activity: Never  Other Topics Concern   Not on file  Social History Narrative   Not on file   Social Determinants of Health   Financial Resource Strain: Not on file  Food Insecurity: Not on file  Transportation Needs: Not  on file  Physical Activity: Not on file  Stress: Not on file  Social Connections: Not on file  Intimate Partner Violence: Not on file    Objective:   Today's Vitals: BP 117/72    Pulse 78    Ht 6\' 1"  (1.854 m)    Wt (!) 346 lb 4.8 oz (157.1 kg)    SpO2 96%    BMI 45.69 kg/m   Physical Exam  76 year old male in no acute distress Cardiovascular exam with regular rate and rhythm Lungs clear to auscultation bilaterally  Assessment & Plan:   Problem List Items Addressed This Visit       Cardiovascular and Mediastinum   HTN (hypertension)    Blood pressure adequate in office today, no evidence of low blood pressure based on history Continue with current regimen of diltiazem, hydrochlorothiazide Recommend DASH diet, continue with regular exercise      PAF (paroxysmal atrial fibrillation) (HCC) - Primary    In normal sinus rhythm on exam today Recommend continued follow-up with cardiology Continue with current medications including metoprolol, diltiazem        Other   Hyperlipidemia    At present, appears to be tolerating current statin well Continue with rosuvastatin 5 mg, plan to check lipid panel with upcoming wellness visit      Bilateral knee pain    Chronic issue for patient, reports in the past he was following with orthopedic surgeon, has had prior meniscal injuries We will refer to orthopedic surgeon for further evaluation and management as per patient request      Relevant Orders   AMB referral to orthopedics   Other Visit Diagnoses     Wellness examination       Relevant Orders   CBC with Differential/Platelet   Comprehensive metabolic panel   Lipid panel   TSH Rfx on Abnormal to Free T4   Hemoglobin A1c       Outpatient Encounter Medications as of 09/29/2021  Medication Sig   aspirin EC 81 MG tablet Take 81 mg by mouth daily.   atorvastatin (LIPITOR) 20 MG tablet Take 1 tablet (20 mg total) by mouth daily.   Cholecalciferol (VITAMIN D3) 2000  UNITS TABS Take 4,000 Units by mouth daily.    diltiazem (CARDIZEM CD) 120 MG 24 hr capsule Take 1 capsule (120 mg total) by mouth daily for blood pressure control.   diltiazem (TIAZAC) 120 MG 24 hr capsule Take 1 capsule (120 mg total) by mouth daily.   fluticasone (FLONASE) 50 MCG/ACT nasal spray Place 1 spray into both nostrils daily as needed for allergies or rhinitis.   fluticasone-salmeterol (ADVAIR DISKUS) 100-50 MCG/ACT AEPB Inhale 2 puffs into the lungs daily for asthma control.   Fluticasone-Salmeterol (ADVAIR) 100-50 MCG/DOSE AEPB Inhale 1 puff into the lungs 2 (two) times daily.   hydrochlorothiazide (HYDRODIURIL) 25 MG tablet Take 1 tablet (25 mg total) by mouth daily.   loratadine (CLARITIN) 10  MG tablet Take 10 mg by mouth daily.   metoprolol tartrate (LOPRESSOR) 25 MG tablet Take 1 tablet (25 mg total) by mouth 2 (two) times daily.   Omega-3 Fatty Acids (FISH OIL) 1200 MG CAPS Take 1,200 mg by mouth daily.   rosuvastatin (CRESTOR) 5 MG tablet Take 1 tablet (5 mg total) by mouth daily.   tamsulosin (FLOMAX) 0.4 MG CAPS capsule Take 0.4 mg by mouth daily after supper.    tamsulosin (FLOMAX) 0.4 MG CAPS capsule Take 1 capsule (0.4 mg total) by mouth nightly to improve urine flow.   trolamine salicylate (ASPERCREME) 10 % cream Apply 1 application topically as needed for muscle pain.   No facility-administered encounter medications on file as of 09/29/2021.    Follow-up: Return in about 3 months (around 12/27/2021).  Plan for follow-up in about 2 to 3 months for CPE with labs completed 1 week prior  Cathlin Buchan J De [place], MD

## 2021-09-29 NOTE — Assessment & Plan Note (Signed)
In normal sinus rhythm on exam today Recommend continued follow-up with cardiology Continue with current medications including metoprolol, diltiazem

## 2021-09-30 ENCOUNTER — Ambulatory Visit (HOSPITAL_COMMUNITY)
Admission: RE | Admit: 2021-09-30 | Discharge: 2021-09-30 | Disposition: A | Discharge status: Home / Self Care | Payer: Medicare HMO | Source: Ambulatory Visit | Attending: Orthopaedic Surgery | Admitting: Orthopaedic Surgery

## 2021-09-30 ENCOUNTER — Ambulatory Visit (HOSPITAL_BASED_OUTPATIENT_CLINIC_OR_DEPARTMENT_OTHER): Payer: Medicare HMO | Admitting: Orthopaedic Surgery

## 2021-09-30 DIAGNOSIS — M1711 Unilateral primary osteoarthritis, right knee: Secondary | ICD-10-CM

## 2021-09-30 DIAGNOSIS — M25562 Pain in left knee: Secondary | ICD-10-CM

## 2021-09-30 DIAGNOSIS — M25561 Pain in right knee: Secondary | ICD-10-CM

## 2021-09-30 DIAGNOSIS — G8929 Other chronic pain: Secondary | ICD-10-CM

## 2021-09-30 DIAGNOSIS — M1712 Unilateral primary osteoarthritis, left knee: Secondary | ICD-10-CM

## 2021-09-30 MED ORDER — TRIAMCINOLONE ACETONIDE 40 MG/ML IJ SUSP
80.0000 mg | INTRAMUSCULAR | Status: AC | PRN
  Administered 2021-09-30: 80 mg via INTRA_ARTICULAR

## 2021-09-30 MED ORDER — LIDOCAINE HCL 1 % IJ SOLN
4.0000 mL | INTRAMUSCULAR | Status: AC | PRN
  Administered 2021-09-30: 4 mL

## 2021-09-30 NOTE — Progress Notes (Signed)
Chief Complaint: Bilateral knee pain     History of Present Illness:    Victor Medina is a 76 y.o. male presents with chronic knee pain ongoing now both knees for 10 years.  He is having significant pain with going up and down stairs.  He is status post a left knee medial meniscectomy which was done many years prior.  Since that time he has had persistent pain and crepitus about the knees.  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  He has previously had gel injections in both knees again many years prior with no relief.  He is a member at scheduled gym and is working on staying active although his knee pain is limiting his ability to do aerobic exercise.  He has pain with even going on the bike    Surgical History:   None  PMH/PSH/Family History/Social History/Meds/Allergies:    Past Medical History:  Diagnosis Date   Asthma    Cancer ([place])    mild skin   Chronic diastolic CHF (congestive heart failure) (HCC)    Morbid obesity (HCC)    OSA (obstructive sleep apnea)    PAF (paroxysmal atrial fibrillation) ([place]) 08/14/2015   s/p TEE/DCCV to NSR;  denies palplita    RBBB    atrial fib on cardizem   Past Surgical History:  Procedure Laterality Date   CARDIOVERSION N/A 05/19/2015   Procedure: CARDIOVERSION;  Surgeon: Fay Records, MD;  Location: [facility] ENDOSCOPY;  Service: Cardiovascular;  Laterality: N/A;   CHOLECYSTECTOMY N/A 12/01/2012   Procedure: LAPAROSCOPIC CHOLECYSTECTOMY;  Surgeon: Gwenyth Ober, MD;  Location: [place];  Service: General;  Laterality: N/A;   COLONOSCOPY WITH PROPOFOL N/A 07/13/2019   Procedure: COLONOSCOPY WITH PROPOFOL;  Surgeon: Carol Ada, MD;  Location: [facility] ENDOSCOPY;  Service: Endoscopy;  Laterality: N/A;   MASS EXCISION Right 02/20/2018   Procedure: RIGHT THUMB EXCISION MASS AND DEBRIDEMENT DISTAL INTRPHLANGEAL JOINT;  Surgeon: Leanora Cover, MD;  Location: [place];  Service:  Orthopedics;  Laterality: Right;   POLYPECTOMY  07/13/2019   Procedure: POLYPECTOMY;  Surgeon: Carol Ada, MD;  Location: [facility] ENDOSCOPY;  Service: Endoscopy;;   right knee     meniscus removal 6 years ago   SHOULDER SURGERY     TEE WITHOUT CARDIOVERSION N/A 05/19/2015   Procedure: TRANSESOPHAGEAL ECHOCARDIOGRAM (TEE);  Surgeon: Fay Records, MD;  Location: [facility] ENDOSCOPY;  Service: Cardiovascular;  Laterality: N/A;   Social History   Socioeconomic History   Marital status: Married    Spouse name: Not on file   Number of children: Not on file   Years of education: Not on file   Highest education level: Not on file  Occupational History   Not on file  Tobacco Use   Smoking status: Former    Years: 20.00    Types: Cigarettes   Smokeless tobacco: Never  Vaping Use   Vaping Use: Never used  Substance and Sexual Activity   Alcohol use: Yes    Alcohol/week: 0.0 standard drinks    Comment: rarely   Drug use: No   Sexual activity: Never  Other Topics Concern   Not on file  Social History Narrative   Not on file   Social Determinants of Health   Financial Resource Strain: Not on file  Food  Insecurity: Not on file  Transportation Needs: Not on file  Physical Activity: Not on file  Stress: Not on file  Social Connections: Not on file   Family History  Problem Relation Age of Onset   Hypotension Mother    Heart disease Sister    No Known Allergies Current Outpatient Medications  Medication Sig Dispense Refill   aspirin EC 81 MG tablet Take 81 mg by mouth daily.     Cholecalciferol (VITAMIN D3) 2000 UNITS TABS Take 4,000 Units by mouth daily.      diltiazem (CARDIZEM CD) 120 MG 24 hr capsule Take 1 capsule (120 mg total) by mouth daily for blood pressure control. 90 capsule 1   diltiazem (TIAZAC) 120 MG 24 hr capsule Take 1 capsule (120 mg total) by mouth daily. 90 capsule 3   fluticasone (FLONASE) 50 MCG/ACT nasal spray Place 1 spray into both  nostrils daily as needed for allergies or rhinitis.     Fluticasone-Salmeterol (ADVAIR) 100-50 MCG/DOSE AEPB Inhale 1 puff into the lungs 2 (two) times daily.     hydrochlorothiazide (HYDRODIURIL) 25 MG tablet Take 1 tablet (25 mg total) by mouth daily. 90 tablet 3   loratadine (CLARITIN) 10 MG tablet Take 10 mg by mouth daily.     metoprolol tartrate (LOPRESSOR) 25 MG tablet Take 1 tablet (25 mg total) by mouth 2 (two) times daily. 180 tablet 3   Omega-3 Fatty Acids (FISH OIL) 1200 MG CAPS Take 1,200 mg by mouth daily.     rosuvastatin (CRESTOR) 5 MG tablet Take 1 tablet (5 mg total) by mouth daily. 90 tablet 2   trolamine salicylate (ASPERCREME) 10 % cream Apply 1 application topically as needed for muscle pain.     No current facility-administered medications for this visit.   No results found.  Review of Systems:   A ROS was performed including pertinent positives and negatives as documented in the HPI.  Physical Exam :   Constitutional: NAD and appears stated age Neurological: Alert and oriented Psych: Appropriate affect and cooperative There were no vitals taken for this visit.   Comprehensive Musculoskeletal Exam:      Musculoskeletal Exam  Gait Normal  Alignment Normal   Right Left  Inspection Normal Normal  Palpation    Tenderness Tricompartmental Tricompartmental  Crepitus Positive Positive  Effusion None None  Range of Motion    Extension 0 0  Flexion 135 135  Strength    Extension 5/5 5/5  Flexion 5/5 5/5  Ligament Exam     Generalized Laxity No No  Lachman Negative Negative   Pivot Shift Negative Negative  Anterior Drawer Negative Negative  Valgus at 0 Negative Negative  Valgus at 20 Negative Negative  Varus at 0 0 0  Varus at 20   0 0  Posterior Drawer at 90 0 0  Vascular/Lymphatic Exam    Edema None None  Venous Stasis Changes No No  Distal Circulation Normal Normal  Neurologic    Light Touch Sensation Intact Intact  Special Tests:       Imaging:   Xray (4 views right knee, 4 views left knee): Left worse than right tricompartmental moderate to severe osteoarthritis   I personally reviewed and interpreted the radiographs.   Assessment:   76 year old male with left worse than right knee tricompartmental osteoarthritis.  Both knees predominantly involve the medial joint space.  At today's visit I have counseled them that I do believe he would ultimately be a candidate for knee replacement.  We did discuss that with a BMI of 46 this would be the only potentially preclusive thing for total knee arthroplasty.  In this vein I have offered a knee steroid injections in an effort to allow him to be more active and work on the bike.  He states that his knee pain is the biggest limiting factor in terms of aerobic exercise.  He is willing to participate and lose weight at this time.  I did describe that I could refer him to a total joint arthroplasty colleague as well so that further discussion can be had.  He is overall very agreeable to working on this in an effort to qualify for joint arthroplasty.   Plan :    -Bilateral ultrasound-guided knee injections performed today -Plan for referral to total joint arthroplasty team    Procedure Note  Patient: ISMAEEL ARVELO             Date of Birth: [birth year]           MRN: [id number]             Visit Date: 09/30/2021  Procedures: Visit Diagnoses:  1. Chronic pain of both knees     Large Joint Inj: R knee on 09/30/2021 5:27 PM Indications: pain Details: 22 G 1.5 in needle, ultrasound-guided anterior approach  Arthrogram: No  Medications: 4 mL lidocaine 1 %; 80 mg triamcinolone acetonide 40 MG/ML Outcome: tolerated well, no immediate complications Procedure, treatment alternatives, risks and benefits explained, specific risks discussed. Consent was given by the patient. Immediately prior to procedure a time out was called to verify the correct patient, procedure,  equipment, support staff and site/side marked as required. Patient was prepped and draped in the usual sterile fashion.    Large Joint Inj: L knee on 09/30/2021 5:27 PM Indications: pain Details: 22 G 1.5 in needle, ultrasound-guided anterior approach  Arthrogram: No  Medications: 4 mL lidocaine 1 %; 80 mg triamcinolone acetonide 40 MG/ML Outcome: tolerated well, no immediate complications Procedure, treatment alternatives, risks and benefits explained, specific risks discussed. Consent was given by the patient. Immediately prior to procedure a time out was called to verify the correct patient, procedure, equipment, support staff and site/side marked as required. Patient was prepped and draped in the usual sterile fashion.          I personally saw and evaluated the patient, and participated in the management and treatment plan.  Vanetta Mulders, MD Attending Physician, Orthopedic Surgery  This document was dictated using Dragon voice recognition software. A reasonable attempt at proof reading has been made to minimize errors.

## 2021-10-01 ENCOUNTER — Ambulatory Visit (HOSPITAL_BASED_OUTPATIENT_CLINIC_OR_DEPARTMENT_OTHER): Payer: Medicare HMO | Admitting: Orthopaedic Surgery

## 2021-10-13 DIAGNOSIS — M25562 Pain in left knee: Secondary | ICD-10-CM | POA: Diagnosis not present

## 2021-10-13 DIAGNOSIS — M25561 Pain in right knee: Secondary | ICD-10-CM | POA: Diagnosis not present

## 2021-11-02 ENCOUNTER — Encounter (HOSPITAL_BASED_OUTPATIENT_CLINIC_OR_DEPARTMENT_OTHER): Payer: Self-pay | Admitting: Orthopaedic Surgery

## 2021-11-25 ENCOUNTER — Ambulatory Visit (HOSPITAL_COMMUNITY)
Admission: RE | Admit: 2021-11-25 | Discharge: 2021-11-25 | Disposition: A | Discharge status: Home / Self Care | Payer: Medicare HMO | Source: Ambulatory Visit | Attending: Nurse Practitioner | Admitting: Nurse Practitioner

## 2021-11-25 ENCOUNTER — Telehealth: Payer: Self-pay | Admitting: Cardiology

## 2021-11-25 VITALS — BP 114/72 | HR 137 | Ht 73.0 in | Wt 339.0 lb

## 2021-11-25 DIAGNOSIS — Z6841 Body Mass Index (BMI) 40.0 and over, adult: Secondary | ICD-10-CM | POA: Insufficient documentation

## 2021-11-25 DIAGNOSIS — Z7901 Long term (current) use of anticoagulants: Secondary | ICD-10-CM | POA: Insufficient documentation

## 2021-11-25 DIAGNOSIS — Z9989 Dependence on other enabling machines and devices: Secondary | ICD-10-CM | POA: Diagnosis not present

## 2021-11-25 DIAGNOSIS — I509 Heart failure, unspecified: Secondary | ICD-10-CM | POA: Diagnosis not present

## 2021-11-25 DIAGNOSIS — I480 Paroxysmal atrial fibrillation: Secondary | ICD-10-CM

## 2021-11-25 DIAGNOSIS — I110 Hypertensive heart disease with heart failure: Secondary | ICD-10-CM | POA: Insufficient documentation

## 2021-11-25 DIAGNOSIS — G4733 Obstructive sleep apnea (adult) (pediatric): Secondary | ICD-10-CM | POA: Diagnosis not present

## 2021-11-25 DIAGNOSIS — I4510 Unspecified right bundle-branch block: Secondary | ICD-10-CM | POA: Diagnosis not present

## 2021-11-25 DIAGNOSIS — Z87891 Personal history of nicotine dependence: Secondary | ICD-10-CM | POA: Insufficient documentation

## 2021-11-25 LAB — COMPREHENSIVE METABOLIC PANEL
ALT: 29 U/L (ref 0–44)
AST: 21 U/L (ref 15–41)
Albumin: 3.5 g/dL (ref 3.5–5.0)
Alkaline Phosphatase: 43 U/L (ref 38–126)
Anion gap: 9 (ref 5–15)
BUN: 26 mg/dL — ABNORMAL HIGH (ref 8–23)
CO2: 24 mmol/L (ref 22–32)
Calcium: 9 mg/dL (ref 8.9–10.3)
Chloride: 104 mmol/L (ref 98–111)
Creatinine, Ser: 1.32 mg/dL — ABNORMAL HIGH (ref 0.61–1.24)
GFR, Estimated: 56 mL/min — ABNORMAL LOW (ref >60)
Glucose, Bld: 109 mg/dL — ABNORMAL HIGH (ref 70–99)
Potassium: 3.6 mmol/L (ref 3.5–5.1)
Sodium: 137 mmol/L (ref 135–145)
Total Bilirubin: 1.3 mg/dL — ABNORMAL HIGH (ref 0.3–1.2)
Total Protein: 6 g/dL — ABNORMAL LOW (ref 6.5–8.1)

## 2021-11-25 LAB — TSH: TSH: 3.837 u[IU]/mL (ref 0.350–4.500)

## 2021-11-25 MED ORDER — DILTIAZEM HCL ER COATED BEADS 120 MG PO CP24: 120.0000 mg | ORAL_CAPSULE | Freq: Two times a day (BID) | ORAL | 1 refills | Status: DC

## 2021-11-25 MED ORDER — APIXABAN 5 MG PO TABS: 5.0000 mg | ORAL_TABLET | Freq: Two times a day (BID) | ORAL | 3 refills | Status: DC

## 2021-11-25 NOTE — Telephone Encounter (Signed)
Patient c/o Palpitations:  High priority if patient c/o lightheadedness, shortness of breath, or chest pain ? ?How long have you had palpitations/irregular HR/ Afib? Are you having the symptoms now? No not  ? at this time, last night until 7:00 this morning ? ?Are you currently experiencing lightheadedness, SOB or CP? no ? ?Do you have a history of afib (atrial fibrillation) or irregular heart rhythm?  ? ?Have you checked your BP or HR? (document readings if available): heart rate at this time 66- , last night it was 43, blood pressure at this time is 123/87 ? ?Are you experiencing any other symptoms? No other symptoms ? ?

## 2021-11-25 NOTE — Progress Notes (Signed)
? ?Primary Care Physician: de [place], Blondell Reveal, MD ?Referring Physician: Dr. Radford Pax ? ? ?Victor Medina is a 75 y.o. male with a h/o OSA with CPAP, CHF, RBBB, aortic root dilatation, HTN, PAF is being seen in the afib clinic for feeling he went into afib sometime this week. EKG shows afib with RBBB, at 137 bpm. He is not on anticoagulation. Previously taking xarelto, states it made him feel bad and has been off drug for some time. First occurrence of afib in some time. His wife is on eliquis and tolerates well, so he is willing to start drug as I explained that I would be limited in being able to get him back in rhythm without it and off anticoagulation increases his stroke risk with a CHA2DS2VASc score of at least 4.  ? ?He denies alcohol use, no tobacco, minimal caffeine, uses cpap but has not been sleeping well due to hip pain.   ? ?Today, he denies symptoms of palpitations, chest pain, shortness of breath, orthopnea, PND, lower extremity edema, dizziness, presyncope, syncope, or neurologic sequela. The patient is tolerating medications without difficulties and is otherwise without complaint today.  ? ?Past Medical History:  ?Diagnosis Date  ? Asthma   ? Cancer [facility])   ? mild skin  ? Chronic diastolic CHF (congestive heart failure) ([place])   ? Morbid obesity ([place])   ? OSA (obstructive sleep apnea)   ? PAF (paroxysmal atrial fibrillation) ([place]) 08/14/2015  ? s/p TEE/DCCV to NSR;  denies palplita   ? RBBB   ? atrial fib on cardizem  ? ?Past Surgical History:  ?Procedure Laterality Date  ? CARDIOVERSION N/A 05/19/2015  ? Procedure: CARDIOVERSION;  Surgeon: Fay Records, MD;  Location: [place];  Service: Cardiovascular;  Laterality: N/A;  ? CHOLECYSTECTOMY N/A 12/01/2012  ? Procedure: LAPAROSCOPIC CHOLECYSTECTOMY;  Surgeon: Gwenyth Ober, MD;  Location: [place];  Service: General;  Laterality: N/A;  ? COLONOSCOPY WITH PROPOFOL N/A 07/13/2019  ? Procedure: COLONOSCOPY WITH PROPOFOL;  Surgeon: Carol Ada, MD;   [facility];  Service: Endoscopy;  Laterality: N/A;  ? MASS EXCISION Right 02/20/2018  ? Procedure: RIGHT THUMB EXCISION MASS AND DEBRIDEMENT DISTAL INTRPHLANGEAL JOINT;  Surgeon: Leanora Cover, MD;  Location: [place];  Service: Orthopedics;  Laterality: Right;  ? POLYPECTOMY  07/13/2019  ? Procedure: POLYPECTOMY;  Surgeon: Carol Ada, MD;  [facility];  Service: Endoscopy;;  ? right knee    ? meniscus removal 6 years ago  ? SHOULDER SURGERY    ? TEE WITHOUT CARDIOVERSION N/A 05/19/2015  ? Procedure: TRANSESOPHAGEAL ECHOCARDIOGRAM (TEE);  Surgeon: Fay Records, MD;  Location: [place];  Service: Cardiovascular;  Laterality: N/A;  ? ? ?Current Outpatient Medications  ?Medication Sig Dispense Refill  ? aspirin EC 81 MG tablet Take 81 mg by mouth daily.    ? Cholecalciferol (VITAMIN D3) 2000 UNITS TABS Take 4,000 Units by mouth daily.     ? diltiazem (CARDIZEM CD) 120 MG 24 hr capsule Take 1 capsule (120 mg total) by mouth daily for blood pressure control. 90 capsule 1  ? fluticasone (FLONASE) 50 MCG/ACT nasal spray Place 1 spray into both nostrils daily as needed for allergies or rhinitis.    ? Fluticasone-Salmeterol (ADVAIR) 100-50 MCG/DOSE AEPB Inhale 1 puff into the lungs 2 (two) times daily.    ? hydrochlorothiazide (HYDRODIURIL) 25 MG tablet Take 1 tablet (25 mg total) by mouth daily. 90 tablet 3  ? loratadine (CLARITIN) 10 MG tablet  Take 10 mg by mouth daily.    ? metoprolol tartrate (LOPRESSOR) 25 MG tablet Take 1 tablet (25 mg total) by mouth 2 (two) times daily. 180 tablet 3  ? Omega-3 Fatty Acids (FISH OIL) 1200 MG CAPS Take 1,200 mg by mouth daily.    ? rosuvastatin (CRESTOR) 5 MG tablet Take 1 tablet (5 mg total) by mouth daily. 90 tablet 2  ? tamsulosin (FLOMAX) 0.4 MG CAPS capsule Take 0.4 mg by mouth at bedtime.    ? trolamine salicylate (ASPERCREME) 10 % cream Apply 1 application topically as needed for muscle pain.    ? ?No current facility-administered  medications for this encounter.  ? ? ?Allergies  ?Allergen Reactions  ? Atorvastatin Other (See Comments)  ?  Patient self discontinued.  Will try Crestor.  ? ? ?Social History  ? ?Socioeconomic History  ? Marital status: Married  ?  Spouse name: Not on file  ? Number of children: Not on file  ? Years of education: Not on file  ? Highest education level: Not on file  ?Occupational History  ? Not on file  ?Tobacco Use  ? Smoking status: Former  ?  Years: 20.00  ?  Types: Cigarettes  ? Smokeless tobacco: Never  ?Vaping Use  ? Vaping Use: Never used  ?Substance and Sexual Activity  ? Alcohol use: Yes  ?  Alcohol/week: 0.0 standard drinks  ?  Comment: rarely  ? Drug use: No  ? Sexual activity: Never  ?Other Topics Concern  ? Not on file  ?Social History Narrative  ? Not on file  ? ?Social Determinants of Health  ? ?Financial Resource Strain: Not on file  ?Food Insecurity: Not on file  ?Transportation Needs: Not on file  ?Physical Activity: Not on file  ?Stress: Not on file  ?Social Connections: Not on file  ?Intimate Partner Violence: Not on file  ? ? ?Family History  ?Problem Relation Age of Onset  ? Hypotension Mother   ? Heart disease Sister   ? ? ?ROS- All systems are reviewed and negative except as per the HPI above ? ?Physical Exam: ?Vitals:  ? 11/25/21 1124  ?BP: 114/72  ?Pulse: (!) 137  ?Weight: (!) 153.8 kg  ?Height: '6\' 1"'$  (1.854 m)  ? ?Wt Readings from Last 3 Encounters:  ?11/25/21 (!) 153.8 kg  ?09/29/21 (!) 157.1 kg  ?01/20/21 (!) 158.5 kg  ? ? ?Labs: ?Lab Results  ?Component Value Date  ? NA 142 02/05/2021  ? K 4.0 02/05/2021  ? CL 103 02/05/2021  ? CO2 23 02/05/2021  ? GLUCOSE 119 (H) 02/05/2021  ? BUN 25 02/05/2021  ? CREATININE 1.19 02/05/2021  ? CALCIUM 9.4 02/05/2021  ? PHOS 3.4 05/16/2015  ? MG 2.2 05/16/2015  ? ?No results found for: INR ?Lab Results  ?Component Value Date  ? CHOL 129 02/05/2021  ? HDL 38 (L) 02/05/2021  ? [place] 72 02/05/2021  ? TRIG 102 02/05/2021  ? ? ? ?GEN- The patient is well  appearing, alert and oriented x 3 today.   ?Head- normocephalic, atraumatic ?Eyes-  Sclera clear, conjunctiva pink ?Ears- hearing intact ?Oropharynx- clear ?Neck- supple, no JVP ?Lymph- no cervical lymphadenopathy ?Lungs- Clear to ausculation bilaterally, normal work of breathing ?Heart- irregular rate and rhythm, no murmurs, rubs or gallops, PMI not laterally displaced ?GI- soft, NT, ND, + BS ?Extremities- no clubbing, cyanosis, or edema ?MS- no significant deformity or atrophy ?Skin- no rash or lesion ?Psych- euthymic mood, full affect ?Neuro- strength and  sensation are intact ? ?EKG-Afib with RBBB, qrs int 140 ms, qtc 549 ms ?Epic records reviewed. ? ? ? ?Assessment and Plan:  ?1. PAF ?No triggers identified ?Returned to afib this week with RVR ?Will increase diltiazem to 120 mg bid  ?Cbc/cmet/tsh  ? ?2. CHA2DS2VASc  score of at least 4 ?Start eliquis '5mg'$  bid  ?No bleeding history ?Bleeding precautions ?Stop asa  ? ?3. OSA ?Continue cpap ? ?4. HTN ?Stable ( on soft side)  ?Hold HCTZ for now until I can see effects on BP ? ?4. BMI of 44.73 ?Weight loss encouraged  ?Was going to gym but asked to  hold off until his rate is better controlled  ? ? ?F/u with EKG/BP on Friday  ? ?Geroge Baseman Kayleen Memos, ANP-C ?Afib Clinic ?[facility] ?[address] ?[place], [place] [address] ?[phone]  ? ? ?

## 2021-11-25 NOTE — Patient Instructions (Signed)
Stop aspirin ? ?Hold HCTZ ? ?Increase Cardizem to '120mg'$  twice a day ? ?Start Eliquis '5mg'$  twice a day ?

## 2021-11-25 NOTE — Telephone Encounter (Signed)
Spoke with the patient who states that he has been in atrial fibrillation since last night. He states that is started around 9:45pm and he was not able to sleep much. He has been having difficulty catching his breath. He wears a fitbit. His heart rate when down to 43 bpm last night. Currently heart rate is 62. He states that he did do a lot of strenuous activity yesterday but other than that has not done anything out of the ordinary. He is taking all medications as prescribed. He is not on a blood thinner and does not wish to take one. I have scheduled him for an appointment in the atrial fibrillation clinic today at 1130am.  ? ? ?

## 2021-11-27 ENCOUNTER — Ambulatory Visit (HOSPITAL_COMMUNITY)
Admission: RE | Admit: 2021-11-27 | Discharge: 2021-11-27 | Disposition: A | Discharge status: Home / Self Care | Payer: Medicare HMO | Source: Ambulatory Visit | Attending: Physician Assistant | Admitting: Physician Assistant

## 2021-11-27 ENCOUNTER — Encounter (HOSPITAL_BASED_OUTPATIENT_CLINIC_OR_DEPARTMENT_OTHER): Payer: Self-pay | Admitting: Family Medicine

## 2021-11-27 ENCOUNTER — Telehealth (HOSPITAL_BASED_OUTPATIENT_CLINIC_OR_DEPARTMENT_OTHER): Payer: Self-pay | Admitting: Family Medicine

## 2021-11-27 VITALS — BP 110/80 | HR 126 | Wt 340.0 lb

## 2021-11-27 DIAGNOSIS — I4891 Unspecified atrial fibrillation: Secondary | ICD-10-CM | POA: Insufficient documentation

## 2021-11-27 DIAGNOSIS — I4819 Other persistent atrial fibrillation: Secondary | ICD-10-CM

## 2021-11-27 DIAGNOSIS — I5032 Chronic diastolic (congestive) heart failure: Secondary | ICD-10-CM

## 2021-11-27 DIAGNOSIS — Z79899 Other long term (current) drug therapy: Secondary | ICD-10-CM | POA: Diagnosis not present

## 2021-11-27 DIAGNOSIS — I10 Essential (primary) hypertension: Secondary | ICD-10-CM

## 2021-11-27 DIAGNOSIS — E7800 Pure hypercholesterolemia, unspecified: Secondary | ICD-10-CM

## 2021-11-27 MED ORDER — METOPROLOL TARTRATE 25 MG PO TABS: 37.5000 mg | ORAL_TABLET | Freq: Two times a day (BID) | ORAL | 3 refills | Status: DC

## 2021-11-27 NOTE — Progress Notes (Signed)
Patient returns for ECG after increasing diltiazem. ECG shows afib with RVR HR 126, RBBB, LAFB, QRS 144, QTc 518. Patient fairly asymptomatic with his rapid rates. Will increase metoprolol to 37.5 mg BID. Schedule DCCV after 3 weeks of anticoagulation. Follow up with Roderic Palau next week to reassess rate control.  ?

## 2021-11-27 NOTE — Patient Instructions (Signed)
Increase metoprolol to 37.'5mg'$  twice a day (1 and 1/2 tablets twice a day) ? ?Cardioversion scheduled for Wednesday, May 10th ? - Arrive at the [facility] and go to admitting at [facility] ? - Do not eat or drink anything after midnight the night prior to your procedure. ? - Take all your morning medication (except diabetic medications) with a sip of water prior to arrival. ? - You will not be able to drive home after your procedure. ? - Do NOT miss any doses of your blood thinner - if you should miss a dose please notify our office immediately. ? - If you feel as if you go back into normal rhythm prior to scheduled cardioversion, please notify our office immediately. If your procedure is canceled in the cardioversion suite you will be charged a cancellation fee. ? ?

## 2021-11-27 NOTE — Telephone Encounter (Signed)
Pt came into the office to say that medications were coming up form renewal. Pt already has a message going with Cma Alyse Low today 11/27/21 that can be referred too.  ?Medication as already been discussed at last appt with provider. ? ?Pt has CPE coming up on 5/23 with provider. ? ?Medication needing renewal are: ?diltiazem (CARDIZEM CD) 120 MG 24 hr capsule [[id number]]  ?**(Pt already got a refill for this from another provider)** ? ?Will need refills before CPE appt on 12/29/21 ?rosuvastatin (CRESTOR) 5 MG tablet [[id number]]  ? ?tamsulosin (FLOMAX) 0.4 MG CAPS capsule [[id number]]  ? ?Advair 100-50 Diskus  ?Inhale 2 puffs into the lungs daily for asthma control Pdp [id number] ?[id number] ?[address] [place] [address] ? ?Please advise. ?

## 2021-12-01 MED ORDER — TAMSULOSIN HCL 0.4 MG PO CAPS: 0.4000 mg | ORAL_CAPSULE | Freq: Every day | ORAL | 0 refills | Status: DC

## 2021-12-01 MED ORDER — DILTIAZEM HCL ER COATED BEADS 120 MG PO CP24: 120.0000 mg | ORAL_CAPSULE | Freq: Two times a day (BID) | ORAL | 0 refills | Status: DC

## 2021-12-01 MED ORDER — ROSUVASTATIN CALCIUM 5 MG PO TABS: 5.0000 mg | ORAL_TABLET | Freq: Every day | ORAL | 0 refills | Status: DC

## 2021-12-01 NOTE — Telephone Encounter (Signed)
Rx sent to pharmacy. AS, CMA 

## 2021-12-02 ENCOUNTER — Encounter (HOSPITAL_COMMUNITY): Payer: Self-pay | Admitting: Nurse Practitioner

## 2021-12-02 ENCOUNTER — Ambulatory Visit (HOSPITAL_COMMUNITY)
Admission: RE | Admit: 2021-12-02 | Discharge: 2021-12-02 | Disposition: A | Discharge status: Home / Self Care | Payer: Medicare HMO | Source: Ambulatory Visit | Attending: Nurse Practitioner | Admitting: Nurse Practitioner

## 2021-12-02 VITALS — BP 114/76 | HR 103 | Ht 73.0 in | Wt 348.8 lb

## 2021-12-02 DIAGNOSIS — I509 Heart failure, unspecified: Secondary | ICD-10-CM | POA: Diagnosis not present

## 2021-12-02 DIAGNOSIS — Z6841 Body Mass Index (BMI) 40.0 and over, adult: Secondary | ICD-10-CM | POA: Diagnosis not present

## 2021-12-02 DIAGNOSIS — D6869 Other thrombophilia: Secondary | ICD-10-CM | POA: Diagnosis not present

## 2021-12-02 DIAGNOSIS — I4819 Other persistent atrial fibrillation: Secondary | ICD-10-CM

## 2021-12-02 DIAGNOSIS — G4733 Obstructive sleep apnea (adult) (pediatric): Secondary | ICD-10-CM | POA: Diagnosis not present

## 2021-12-02 DIAGNOSIS — I110 Hypertensive heart disease with heart failure: Secondary | ICD-10-CM | POA: Diagnosis not present

## 2021-12-02 DIAGNOSIS — E669 Obesity, unspecified: Secondary | ICD-10-CM | POA: Diagnosis not present

## 2021-12-02 DIAGNOSIS — Z7901 Long term (current) use of anticoagulants: Secondary | ICD-10-CM | POA: Insufficient documentation

## 2021-12-02 DIAGNOSIS — I480 Paroxysmal atrial fibrillation: Secondary | ICD-10-CM | POA: Insufficient documentation

## 2021-12-02 DIAGNOSIS — I4510 Unspecified right bundle-branch block: Secondary | ICD-10-CM | POA: Insufficient documentation

## 2021-12-02 LAB — BASIC METABOLIC PANEL
Anion gap: 6 (ref 5–15)
BUN: 18 mg/dL (ref 8–23)
CO2: 26 mmol/L (ref 22–32)
Calcium: 9.3 mg/dL (ref 8.9–10.3)
Chloride: 106 mmol/L (ref 98–111)
Creatinine, Ser: 1.12 mg/dL (ref 0.61–1.24)
GFR, Estimated: >60 mL/min (ref >60)
Glucose, Bld: 95 mg/dL (ref 70–99)
Potassium: 4.9 mmol/L (ref 3.5–5.1)
Sodium: 138 mmol/L (ref 135–145)

## 2021-12-02 LAB — CBC
HCT: 47.8 % (ref 39.0–52.0)
Hemoglobin: 15.5 g/dL (ref 13.0–17.0)
MCH: 29.3 pg (ref 26.0–34.0)
MCHC: 32.4 g/dL (ref 30.0–36.0)
MCV: 90.4 fL (ref 80.0–100.0)
Platelets: 236 10*3/uL (ref 150–400)
RBC: 5.29 MIL/uL (ref 4.22–5.81)
RDW: 14.4 % (ref 11.5–15.5)
WBC: 7.2 10*3/uL (ref 4.0–10.5)
nRBC: 0 % (ref 0.0–0.2)

## 2021-12-02 NOTE — Progress Notes (Addendum)
? ?Primary Care Physician: de [place], Blondell Reveal, MD ?Referring Physician: Dr. Radford Pax ? ? ?Victor Medina is a 76 y.o. male with a h/o OSA with CPAP, CHF, RBBB, aortic root dilatation, HTN, PAF is being seen in the afib clinic for feeling he went into afib sometime this week. EKG shows afib with RBBB, at 137 bpm. He is not on anticoagulation. Previously taking xarelto, states it made him feel bad and has been off drug for some time. First occurrence of afib in some time. His wife is on eliquis and tolerates well, so he is willing to start drug as I explained that I would be limited in being able to get him back in rhythm without it and off anticoagulation increases his stroke risk with a CHA2DS2VASc score of at least 4.  ? ?He denies alcohol use, no tobacco, minimal caffeine, uses cpap but has not been sleeping well due to hip pain.   ? ?F/u in afib 12/02/21. On last visit, his rate control meds were increased as he still had afib with RVR. Today, he remains in afib at 101 bpm. Since he was holding HCTZ, to allow more rate control, he noted more leg swelling yesterday and took a dose with significant urination. LLE improved. His BP looks ok today so he will resume HCTZ daily, keeping an eye on BP. He has not missed any anticoagulation since starting 4/20 and is set up for cardioversion 5/11.  ? ?Today, he denies symptoms of palpitations, chest pain, shortness of breath, orthopnea, PND, lower extremity edema, dizziness, presyncope, syncope, or neurologic sequela. The patient is tolerating medications without difficulties and is otherwise without complaint today.  ? ?Past Medical History:  ?Diagnosis Date  ? Asthma   ? Cancer [facility])   ? mild skin  ? Chronic diastolic CHF (congestive heart failure) ([place])   ? Morbid obesity ([place])   ? OSA (obstructive sleep apnea)   ? PAF (paroxysmal atrial fibrillation) ([place]) 08/14/2015  ? s/p TEE/DCCV to NSR;  denies palplita   ? RBBB   ? atrial fib on cardizem  ? ?Past Surgical  History:  ?Procedure Laterality Date  ? CARDIOVERSION N/A 05/19/2015  ? Procedure: CARDIOVERSION;  Surgeon: Fay Records, MD;  Location: [place];  Service: Cardiovascular;  Laterality: N/A;  ? CHOLECYSTECTOMY N/A 12/01/2012  ? Procedure: LAPAROSCOPIC CHOLECYSTECTOMY;  Surgeon: Gwenyth Ober, MD;  Location: [place];  Service: General;  Laterality: N/A;  ? COLONOSCOPY WITH PROPOFOL N/A 07/13/2019  ? Procedure: COLONOSCOPY WITH PROPOFOL;  Surgeon: Carol Ada, MD;  [facility];  Service: Endoscopy;  Laterality: N/A;  ? MASS EXCISION Right 02/20/2018  ? Procedure: RIGHT THUMB EXCISION MASS AND DEBRIDEMENT DISTAL INTRPHLANGEAL JOINT;  Surgeon: Leanora Cover, MD;  Location: [place];  Service: Orthopedics;  Laterality: Right;  ? POLYPECTOMY  07/13/2019  ? Procedure: POLYPECTOMY;  Surgeon: Carol Ada, MD;  [facility];  Service: Endoscopy;;  ? right knee    ? meniscus removal 6 years ago  ? SHOULDER SURGERY    ? TEE WITHOUT CARDIOVERSION N/A 05/19/2015  ? Procedure: TRANSESOPHAGEAL ECHOCARDIOGRAM (TEE);  Surgeon: Fay Records, MD;  Location: [place];  Service: Cardiovascular;  Laterality: N/A;  ? ? ?Current Outpatient Medications  ?Medication Sig Dispense Refill  ? apixaban (ELIQUIS) 5 MG TABS tablet Take 1 tablet (5 mg total) by mouth 2 (two) times daily. 60 tablet 3  ? Cholecalciferol (VITAMIN D3) 2000 UNITS TABS Take 4,000 Units by mouth daily.     ?  diltiazem (CARDIZEM CD) 120 MG 24 hr capsule Take 1 capsule (120 mg total) by mouth 2 (two) times daily. 90 capsule 0  ? fluticasone (FLONASE) 50 MCG/ACT nasal spray Place 1 spray into both nostrils daily as needed for allergies or rhinitis.    ? Fluticasone-Salmeterol (ADVAIR) 100-50 MCG/DOSE AEPB Inhale 1 puff into the lungs 2 (two) times daily.    ? hydrochlorothiazide (HYDRODIURIL) 25 MG tablet Take 1 tablet (25 mg total) by mouth daily. 90 tablet 3  ? loratadine (CLARITIN) 10 MG tablet Take 10 mg by mouth daily.    ?  metoprolol tartrate (LOPRESSOR) 25 MG tablet Take 1.5 tablets (37.5 mg total) by mouth 2 (two) times daily. 180 tablet 3  ? Omega-3 Fatty Acids (FISH OIL) 1200 MG CAPS Take 1,200 mg by mouth daily.    ? rosuvastatin (CRESTOR) 5 MG tablet Take 1 tablet (5 mg total) by mouth daily. 90 tablet 0  ? tamsulosin (FLOMAX) 0.4 MG CAPS capsule Take 1 capsule (0.4 mg total) by mouth at bedtime. 90 capsule 0  ? trolamine salicylate (ASPERCREME) 10 % cream Apply 1 application topically as needed for muscle pain.    ? ?No current facility-administered medications for this encounter.  ? ? ?Allergies  ?Allergen Reactions  ? Atorvastatin Other (See Comments)  ?  Patient self discontinued.  Will try Crestor.  ? ? ?Social History  ? ?Socioeconomic History  ? Marital status: Married  ?  Spouse name: Not on file  ? Number of children: Not on file  ? Years of education: Not on file  ? Highest education level: Not on file  ?Occupational History  ? Not on file  ?Tobacco Use  ? Smoking status: Former  ?  Years: 20.00  ?  Types: Cigarettes  ? Smokeless tobacco: Never  ?Vaping Use  ? Vaping Use: Never used  ?Substance and Sexual Activity  ? Alcohol use: Yes  ?  Alcohol/week: 0.0 standard drinks  ?  Comment: rarely  ? Drug use: No  ? Sexual activity: Never  ?Other Topics Concern  ? Not on file  ?Social History Narrative  ? Not on file  ? ?Social Determinants of Health  ? ?Financial Resource Strain: Not on file  ?Food Insecurity: Not on file  ?Transportation Needs: Not on file  ?Physical Activity: Not on file  ?Stress: Not on file  ?Social Connections: Not on file  ?Intimate Partner Violence: Not on file  ? ? ?Family History  ?Problem Relation Age of Onset  ? Hypotension Mother   ? Heart disease Sister   ? ? ?ROS- All systems are reviewed and negative except as per the HPI above ? ?Physical Exam: ?Vitals:  ? 12/02/21 0843  ?BP: 114/76  ?Pulse: (!) 103  ?Weight: (!) 158.2 kg  ?Height: '6\' 1"'$  (1.854 m)  ? ?Wt Readings from Last 3 Encounters:   ?12/02/21 (!) 158.2 kg  ?11/27/21 (!) 154.2 kg  ?11/25/21 (!) 153.8 kg  ? ? ?Labs: ?Lab Results  ?Component Value Date  ? NA 137 11/25/2021  ? K 3.6 11/25/2021  ? CL 104 11/25/2021  ? CO2 24 11/25/2021  ? GLUCOSE 109 (H) 11/25/2021  ? BUN 26 (H) 11/25/2021  ? CREATININE 1.32 (H) 11/25/2021  ? CALCIUM 9.0 11/25/2021  ? PHOS 3.4 05/16/2015  ? MG 2.2 05/16/2015  ? ?No results found for: INR ?Lab Results  ?Component Value Date  ? CHOL 129 02/05/2021  ? HDL 38 (L) 02/05/2021  ? [place] 72 02/05/2021  ?  TRIG 102 02/05/2021  ? ? ? ?GEN- The patient is well appearing, alert and oriented x 3 today.   ?Head- normocephalic, atraumatic ?Eyes-  Sclera clear, conjunctiva pink ?Ears- hearing intact ?Oropharynx- clear ?Neck- supple, no JVP ?Lymph- no cervical lymphadenopathy ?Lungs- Clear to ausculation bilaterally, normal work of breathing ?Heart- irregular rate and rhythm, no murmurs, rubs or gallops, PMI not laterally displaced ?GI- soft, NT, ND, + BS ?Extremities- no clubbing, cyanosis, or edema ?MS- no significant deformity or atrophy ?Skin- no rash or lesion ?Psych- euthymic mood, full affect ?Neuro- strength and sensation are intact ? ?EKG-Afib with RBBB, qrs int 140 ms, qtc 549 ms ?Epic records reviewed. ? ? ? ?Assessment and Plan:  ?1. Paroxysmal  afib  ?No triggers identified ?Had been quiet for some time  ?Now in persistent afib since at least 4/18 ?Will increase diltiazem to 120 mg bid  ?Cbc/bmet  ? ?2. CHA2DS2VASc  score of at least 4 ?Continue eliquis '5mg'$  bid  ?States no missed doses since starting 4/20 ? ?3. OSA ?Continue cpap ? ?4. HTN ?Was holding  HCTZ until rate control on board ?He had to return to taking it yesterday for LLE ?Improved to day  ?Watch BP at home  ? ?4. BMI of 44.73 ?Weight loss encouraged  ?Was going to gym but asked to  hold off until his rate is better controlled  ? ? Cardioversion scheduled for 5/11 ( 21 days on anticoagulation  )  ?Risk vrs benefit explained  ?F/u  in clinic one week after  cardioversion  ? ?Geroge Baseman Kayleen Memos, ANP-C ?Afib Clinic ?[facility] ?[address] ?[place], [place] [address] ?[phone]  ? ? ?

## 2021-12-02 NOTE — H&P (View-Only) (Signed)
? ?Primary Care Physician: de [place], Blondell Reveal, MD ?Referring Physician: Dr. Radford Pax ? ? ?Victor Medina is a 76 y.o. male with a h/o OSA with CPAP, CHF, RBBB, aortic root dilatation, HTN, PAF is being seen in the afib clinic for feeling he went into afib sometime this week. EKG shows afib with RBBB, at 137 bpm. He is not on anticoagulation. Previously taking xarelto, states it made him feel bad and has been off drug for some time. First occurrence of afib in some time. His wife is on eliquis and tolerates well, so he is willing to start drug as I explained that I would be limited in being able to get him back in rhythm without it and off anticoagulation increases his stroke risk with a CHA2DS2VASc score of at least 4.  ? ?He denies alcohol use, no tobacco, minimal caffeine, uses cpap but has not been sleeping well due to hip pain.   ? ?F/u in afib 12/02/21. On last visit, his rate control meds were increased as he still had afib with RVR. Today, he remains in afib at 101 bpm. Since he was holding HCTZ, to allow more rate control, he noted more leg swelling yesterday and took a dose with significant urination. LLE improved. His BP looks ok today so he will resume HCTZ daily, keeping an eye on BP. He has not missed any anticoagulation since starting 4/20 and is set up for cardioversion 5/11.  ? ?Today, he denies symptoms of palpitations, chest pain, shortness of breath, orthopnea, PND, lower extremity edema, dizziness, presyncope, syncope, or neurologic sequela. The patient is tolerating medications without difficulties and is otherwise without complaint today.  ? ?Past Medical History:  ?Diagnosis Date  ? Asthma   ? Cancer [facility])   ? mild skin  ? Chronic diastolic CHF (congestive heart failure) ([place])   ? Morbid obesity ([place])   ? OSA (obstructive sleep apnea)   ? PAF (paroxysmal atrial fibrillation) ([place]) 08/14/2015  ? s/p TEE/DCCV to NSR;  denies palplita   ? RBBB   ? atrial fib on cardizem  ? ?Past Surgical  History:  ?Procedure Laterality Date  ? CARDIOVERSION N/A 05/19/2015  ? Procedure: CARDIOVERSION;  Surgeon: Fay Records, MD;  Location: [place];  Service: Cardiovascular;  Laterality: N/A;  ? CHOLECYSTECTOMY N/A 12/01/2012  ? Procedure: LAPAROSCOPIC CHOLECYSTECTOMY;  Surgeon: Gwenyth Ober, MD;  Location: [place];  Service: General;  Laterality: N/A;  ? COLONOSCOPY WITH PROPOFOL N/A 07/13/2019  ? Procedure: COLONOSCOPY WITH PROPOFOL;  Surgeon: Carol Ada, MD;  [facility];  Service: Endoscopy;  Laterality: N/A;  ? MASS EXCISION Right 02/20/2018  ? Procedure: RIGHT THUMB EXCISION MASS AND DEBRIDEMENT DISTAL INTRPHLANGEAL JOINT;  Surgeon: Leanora Cover, MD;  Location: [place];  Service: Orthopedics;  Laterality: Right;  ? POLYPECTOMY  07/13/2019  ? Procedure: POLYPECTOMY;  Surgeon: Carol Ada, MD;  [facility];  Service: Endoscopy;;  ? right knee    ? meniscus removal 6 years ago  ? SHOULDER SURGERY    ? TEE WITHOUT CARDIOVERSION N/A 05/19/2015  ? Procedure: TRANSESOPHAGEAL ECHOCARDIOGRAM (TEE);  Surgeon: Fay Records, MD;  Location: [place];  Service: Cardiovascular;  Laterality: N/A;  ? ? ?Current Outpatient Medications  ?Medication Sig Dispense Refill  ? apixaban (ELIQUIS) 5 MG TABS tablet Take 1 tablet (5 mg total) by mouth 2 (two) times daily. 60 tablet 3  ? Cholecalciferol (VITAMIN D3) 2000 UNITS TABS Take 4,000 Units by mouth daily.     ?  diltiazem (CARDIZEM CD) 120 MG 24 hr capsule Take 1 capsule (120 mg total) by mouth 2 (two) times daily. 90 capsule 0  ? fluticasone (FLONASE) 50 MCG/ACT nasal spray Place 1 spray into both nostrils daily as needed for allergies or rhinitis.    ? Fluticasone-Salmeterol (ADVAIR) 100-50 MCG/DOSE AEPB Inhale 1 puff into the lungs 2 (two) times daily.    ? hydrochlorothiazide (HYDRODIURIL) 25 MG tablet Take 1 tablet (25 mg total) by mouth daily. 90 tablet 3  ? loratadine (CLARITIN) 10 MG tablet Take 10 mg by mouth daily.    ?  metoprolol tartrate (LOPRESSOR) 25 MG tablet Take 1.5 tablets (37.5 mg total) by mouth 2 (two) times daily. 180 tablet 3  ? Omega-3 Fatty Acids (FISH OIL) 1200 MG CAPS Take 1,200 mg by mouth daily.    ? rosuvastatin (CRESTOR) 5 MG tablet Take 1 tablet (5 mg total) by mouth daily. 90 tablet 0  ? tamsulosin (FLOMAX) 0.4 MG CAPS capsule Take 1 capsule (0.4 mg total) by mouth at bedtime. 90 capsule 0  ? trolamine salicylate (ASPERCREME) 10 % cream Apply 1 application topically as needed for muscle pain.    ? ?No current facility-administered medications for this encounter.  ? ? ?Allergies  ?Allergen Reactions  ? Atorvastatin Other (See Comments)  ?  Patient self discontinued.  Will try Crestor.  ? ? ?Social History  ? ?Socioeconomic History  ? Marital status: Married  ?  Spouse name: Not on file  ? Number of children: Not on file  ? Years of education: Not on file  ? Highest education level: Not on file  ?Occupational History  ? Not on file  ?Tobacco Use  ? Smoking status: Former  ?  Years: 20.00  ?  Types: Cigarettes  ? Smokeless tobacco: Never  ?Vaping Use  ? Vaping Use: Never used  ?Substance and Sexual Activity  ? Alcohol use: Yes  ?  Alcohol/week: 0.0 standard drinks  ?  Comment: rarely  ? Drug use: No  ? Sexual activity: Never  ?Other Topics Concern  ? Not on file  ?Social History Narrative  ? Not on file  ? ?Social Determinants of Health  ? ?Financial Resource Strain: Not on file  ?Food Insecurity: Not on file  ?Transportation Needs: Not on file  ?Physical Activity: Not on file  ?Stress: Not on file  ?Social Connections: Not on file  ?Intimate Partner Violence: Not on file  ? ? ?Family History  ?Problem Relation Age of Onset  ? Hypotension Mother   ? Heart disease Sister   ? ? ?ROS- All systems are reviewed and negative except as per the HPI above ? ?Physical Exam: ?Vitals:  ? 12/02/21 0843  ?BP: 114/76  ?Pulse: (!) 103  ?Weight: (!) 158.2 kg  ?Height: '6\' 1"'$  (1.854 m)  ? ?Wt Readings from Last 3 Encounters:   ?12/02/21 (!) 158.2 kg  ?11/27/21 (!) 154.2 kg  ?11/25/21 (!) 153.8 kg  ? ? ?Labs: ?Lab Results  ?Component Value Date  ? NA 137 11/25/2021  ? K 3.6 11/25/2021  ? CL 104 11/25/2021  ? CO2 24 11/25/2021  ? GLUCOSE 109 (H) 11/25/2021  ? BUN 26 (H) 11/25/2021  ? CREATININE 1.32 (H) 11/25/2021  ? CALCIUM 9.0 11/25/2021  ? PHOS 3.4 05/16/2015  ? MG 2.2 05/16/2015  ? ?No results found for: INR ?Lab Results  ?Component Value Date  ? CHOL 129 02/05/2021  ? HDL 38 (L) 02/05/2021  ? [place] 72 02/05/2021  ?  TRIG 102 02/05/2021  ? ? ? ?GEN- The patient is well appearing, alert and oriented x 3 today.   ?Head- normocephalic, atraumatic ?Eyes-  Sclera clear, conjunctiva pink ?Ears- hearing intact ?Oropharynx- clear ?Neck- supple, no JVP ?Lymph- no cervical lymphadenopathy ?Lungs- Clear to ausculation bilaterally, normal work of breathing ?Heart- irregular rate and rhythm, no murmurs, rubs or gallops, PMI not laterally displaced ?GI- soft, NT, ND, + BS ?Extremities- no clubbing, cyanosis, or edema ?MS- no significant deformity or atrophy ?Skin- no rash or lesion ?Psych- euthymic mood, full affect ?Neuro- strength and sensation are intact ? ?EKG-Afib with RBBB, qrs int 140 ms, qtc 549 ms ?Epic records reviewed. ? ? ? ?Assessment and Plan:  ?1. Paroxysmal  afib  ?No triggers identified ?Had been quiet for some time  ?Now in persistent afib since at least 4/18 ?Will increase diltiazem to 120 mg bid  ?Cbc/bmet  ? ?2. CHA2DS2VASc  score of at least 4 ?Continue eliquis '5mg'$  bid  ?States no missed doses since starting 4/20 ? ?3. OSA ?Continue cpap ? ?4. HTN ?Was holding  HCTZ until rate control on board ?He had to return to taking it yesterday for LLE ?Improved to day  ?Watch BP at home  ? ?4. BMI of 44.73 ?Weight loss encouraged  ?Was going to gym but asked to  hold off until his rate is better controlled  ? ? Cardioversion scheduled for 5/11 ( 21 days on anticoagulation  )  ?Risk vrs benefit explained  ?F/u  in clinic one week after  cardioversion  ? ?Geroge Baseman Kayleen Memos, ANP-C ?Afib Clinic ?[facility] ?[address] ?[place], [place] [address] ?[phone]  ? ? ?

## 2021-12-02 NOTE — Patient Instructions (Signed)
Cardioversion scheduled for Thursday, May 11th ? - Arrive at the [facility] and go to admitting at [place] ? - Do not eat or drink anything after midnight the night prior to your procedure. ? - Take all your morning medication (except diabetic medications) with a sip of water prior to arrival. ? - You will not be able to drive home after your procedure. ? - Do NOT miss any doses of your blood thinner - if you should miss a dose please notify our office immediately. ? - If you feel as if you go back into normal rhythm prior to scheduled cardioversion, please notify our office immediately. If your procedure is canceled in the cardioversion suite you will be charged a cancellation fee. ? ?

## 2021-12-10 ENCOUNTER — Encounter (HOSPITAL_COMMUNITY): Payer: Self-pay | Admitting: Internal Medicine

## 2021-12-10 NOTE — Progress Notes (Signed)
Attempted to obtain medical history via telephone, unable to reach at this time. I left a voicemail to return pre surgical testing department's phone call.  

## 2021-12-17 ENCOUNTER — Ambulatory Visit (HOSPITAL_COMMUNITY)
Admission: RE | Admit: 2021-12-17 | Discharge: 2021-12-17 | Disposition: A | Discharge status: Home / Self Care | Payer: Medicare HMO | Attending: Internal Medicine | Admitting: Internal Medicine

## 2021-12-17 ENCOUNTER — Encounter (HOSPITAL_COMMUNITY)
Admission: RE | Disposition: A | Discharge status: Home / Self Care | Payer: Self-pay | Source: Home / Self Care | Attending: Internal Medicine

## 2021-12-17 ENCOUNTER — Ambulatory Visit (HOSPITAL_COMMUNITY): Payer: Medicare HMO | Admitting: Certified Registered"

## 2021-12-17 ENCOUNTER — Encounter (HOSPITAL_COMMUNITY): Payer: Self-pay | Admitting: Internal Medicine

## 2021-12-17 ENCOUNTER — Ambulatory Visit (HOSPITAL_BASED_OUTPATIENT_CLINIC_OR_DEPARTMENT_OTHER): Payer: Medicare HMO | Admitting: Certified Registered"

## 2021-12-17 DIAGNOSIS — I4510 Unspecified right bundle-branch block: Secondary | ICD-10-CM | POA: Insufficient documentation

## 2021-12-17 DIAGNOSIS — Z6841 Body Mass Index (BMI) 40.0 and over, adult: Secondary | ICD-10-CM | POA: Insufficient documentation

## 2021-12-17 DIAGNOSIS — Z7901 Long term (current) use of anticoagulants: Secondary | ICD-10-CM | POA: Diagnosis not present

## 2021-12-17 DIAGNOSIS — G4733 Obstructive sleep apnea (adult) (pediatric): Secondary | ICD-10-CM | POA: Diagnosis not present

## 2021-12-17 DIAGNOSIS — I509 Heart failure, unspecified: Secondary | ICD-10-CM | POA: Diagnosis not present

## 2021-12-17 DIAGNOSIS — I4891 Unspecified atrial fibrillation: Secondary | ICD-10-CM | POA: Diagnosis not present

## 2021-12-17 DIAGNOSIS — G4730 Sleep apnea, unspecified: Secondary | ICD-10-CM | POA: Diagnosis not present

## 2021-12-17 DIAGNOSIS — I110 Hypertensive heart disease with heart failure: Secondary | ICD-10-CM | POA: Diagnosis not present

## 2021-12-17 DIAGNOSIS — I10 Essential (primary) hypertension: Secondary | ICD-10-CM | POA: Insufficient documentation

## 2021-12-17 DIAGNOSIS — I4819 Other persistent atrial fibrillation: Secondary | ICD-10-CM | POA: Insufficient documentation

## 2021-12-17 DIAGNOSIS — Z87891 Personal history of nicotine dependence: Secondary | ICD-10-CM | POA: Diagnosis not present

## 2021-12-17 DIAGNOSIS — J45909 Unspecified asthma, uncomplicated: Secondary | ICD-10-CM | POA: Insufficient documentation

## 2021-12-17 HISTORY — PX: CARDIOVERSION: SHX1299

## 2021-12-17 SURGERY — CARDIOVERSION
Anesthesia: General

## 2021-12-17 MED ORDER — LIDOCAINE 2% (20 MG/ML) 5 ML SYRINGE
INTRAMUSCULAR | Status: DC | PRN
Start: 2021-12-17 — End: 2021-12-17
  Administered 2021-12-17: 40 mg via INTRAVENOUS

## 2021-12-17 MED ORDER — PROPOFOL 10 MG/ML IV BOLUS
INTRAVENOUS | Status: DC | PRN
  Administered 2021-12-17: 50 mg via INTRAVENOUS

## 2021-12-17 MED ORDER — SODIUM CHLORIDE 0.9 % IV SOLN: INTRAVENOUS | Status: DC

## 2021-12-17 MED ORDER — SODIUM CHLORIDE 0.9 % IV SOLN: INTRAVENOUS | Status: DC | PRN

## 2021-12-17 NOTE — Interval H&P Note (Signed)
History and Physical Interval Note: ? ?12/17/2021 ?9:34 AM ? ?Victor Medina  has presented today for surgery, with the diagnosis of AFIB.  The various methods of treatment have been discussed with the patient and family. After consideration of risks, benefits and other options for treatment, the patient has consented to  Procedure(s): ?CARDIOVERSION (N/A) as a surgical intervention.  The patient's history has been reviewed, patient examined, no change in status, stable for surgery.  I have reviewed the patient's chart and labs.  Questions were answered to the patient's satisfaction.   ? ? ?Dorris Carnes ? ? ?

## 2021-12-17 NOTE — Anesthesia Postprocedure Evaluation (Signed)
Anesthesia Post Note ? ?Patient: Victor Medina ? ?Procedure(s) Performed: CARDIOVERSION ? ?  ? ?Patient location during evaluation: PACU ?Anesthesia Type: General ?Level of consciousness: awake and alert ?Pain management: pain level controlled ?Vital Signs Assessment: post-procedure vital signs reviewed and stable ?Respiratory status: spontaneous breathing, nonlabored ventilation, respiratory function stable and patient connected to nasal cannula oxygen ?Cardiovascular status: blood pressure returned to baseline and stable ?Postop Assessment: no apparent nausea or vomiting ?Anesthetic complications: no ? ? ?No notable events documented. ? ?Last Vitals:  ?Vitals:  ? 12/17/21 1008 12/17/21 1016  ?BP: (!) 106/58 110/75  ?Pulse: 69 69  ?Resp: 20 12  ?Temp:    ?SpO2: 99% 99%  ?  ?Last Pain:  ?Vitals:  ? 12/17/21 0930  ?TempSrc: Temporal  ?PainSc: 0-No pain  ? ? ?  ?  ?  ?  ?  ?  ? ?Effie Berkshire ? ? ? ? ?

## 2021-12-17 NOTE — Anesthesia Preprocedure Evaluation (Addendum)
Anesthesia Evaluation  ?Patient identified by MRN, date of birth, ID band ?Patient awake ? ? ? ?Reviewed: ?Allergy & Precautions, NPO status , Patient's Chart, lab work & pertinent test results, reviewed documented beta blocker date and time  ? ?Airway ?Mallampati: II ? ?TM Distance: >3 FB ?Neck ROM: Full ? ? ? Dental ? ?(+) Teeth Intact, Dental Advisory Given ?  ?Pulmonary ?asthma , sleep apnea , former smoker,  ?  ?breath sounds clear to auscultation ? ? ? ? ? ? Cardiovascular ?hypertension, Pt. on home beta blockers ?+CHF  ?+ dysrhythmias Atrial Fibrillation  ?Rhythm:Irregular Rate:Abnormal ? ?Echo: ??1. Aortic root dimension within normal limits when indexed to BSA for  ?age, 39 mm.  ??2. Left ventricular ejection fraction, by estimation, is 50 to 55%. The  ?left ventricle has low normal function. The left ventricle has no regional  ?wall motion abnormalities. There is mild left ventricular hypertrophy.  ?Left ventricular diastolic  ?parameters are consistent with Grade I diastolic dysfunction (impaired  ?relaxation).  ??3. Right ventricular systolic function is normal. The right ventricular  ?size is mildly enlarged.  ??4. The mitral valve is normal in structure. No evidence of mitral valve  ?regurgitation. No evidence of mitral stenosis.  ??5. The aortic valve is grossly normal. Aortic valve regurgitation is not  ?visualized. No aortic stenosis is present. ?  ?Neuro/Psych ?negative neurological ROS ? negative psych ROS  ? GI/Hepatic ?negative GI ROS, Neg liver ROS,   ?Endo/Other  ?negative endocrine ROS ? Renal/GU ?negative Renal ROS  ? ?  ?Musculoskeletal ?negative musculoskeletal ROS ?(+)  ? Abdominal ?(+) + obese,   ?Peds ? Hematology ?negative hematology ROS ?(+)   ?Anesthesia Other Findings ? ? Reproductive/Obstetrics ? ?  ? ? ? ? ? ? ? ? ? ? ? ? ? ?  ?  ? ? ? ? ? ? ? ?Anesthesia Physical ?Anesthesia Plan ? ?ASA: 3 ? ?Anesthesia Plan: General  ? ?Post-op Pain Management:    ? ?Induction: Intravenous ? ?PONV Risk Score and Plan: 0 and Propofol infusion ? ?Airway Management Planned: Natural Airway and Simple Face Mask ? ?Additional Equipment: None ? ?Intra-op Plan:  ? ?Post-operative Plan:  ? ?Informed Consent: I have reviewed the patients History and Physical, chart, labs and discussed the procedure including the risks, benefits and alternatives for the proposed anesthesia with the patient or authorized representative who has indicated his/her understanding and acceptance.  ? ? ? ? ? ?Plan Discussed with: CRNA ? ?Anesthesia Plan Comments:   ? ? ? ? ? ? ?Anesthesia Quick Evaluation ? ?

## 2021-12-17 NOTE — CV Procedure (Signed)
DC CARDIOVERSION ? ?Indication:   Atrial fibrillation   ?(Patient has taken Eliquis as directed for 22 days, no missed doses) ? ? ?Patient sedated by anesthesia with  50 mg propofol and 40 mg lidocaine intravenously ? ?With pads in the AP position, patient cardioverted to SR with 200 J synchronized biphasic energy ? ?Procedure was without complication ? ?12 lead EKG pending  ? ?Dorris Carnes MD  ?

## 2021-12-17 NOTE — Transfer of Care (Signed)
Immediate Anesthesia Transfer of Care Note ? ?Patient: Victor Medina ? ?Procedure(s) Performed: CARDIOVERSION ? ?Patient Location: Endoscopy Unit ? ?Anesthesia Type:General ? ?Level of Consciousness: awake, drowsy and patient cooperative ? ?Airway & Oxygen Therapy: Patient Spontanous Breathing ? ?Post-op Assessment: Report given to RN, Post -op Vital signs reviewed and stable and Patient moving all extremities X 4 ? ?Post vital signs: Reviewed and stable ? ?Last Vitals:  ?Vitals Value Taken Time  ?BP    ?Temp    ?Pulse    ?Resp    ?SpO2    ? ? ?Last Pain:  ?Vitals:  ? 12/17/21 0930  ?TempSrc: Temporal  ?PainSc: 0-No pain  ?   ? ?  ? ?Complications: No notable events documented. ?

## 2021-12-17 NOTE — Discharge Instructions (Signed)

## 2021-12-23 ENCOUNTER — Ambulatory Visit (HOSPITAL_BASED_OUTPATIENT_CLINIC_OR_DEPARTMENT_OTHER): Payer: Medicare HMO

## 2021-12-23 DIAGNOSIS — Z0000 Encounter for general adult medical examination without abnormal findings: Secondary | ICD-10-CM

## 2021-12-24 ENCOUNTER — Ambulatory Visit (HOSPITAL_COMMUNITY)
Admission: RE | Admit: 2021-12-24 | Discharge: 2021-12-24 | Disposition: A | Discharge status: Home / Self Care | Payer: Medicare HMO | Source: Ambulatory Visit | Attending: Nurse Practitioner | Admitting: Nurse Practitioner

## 2021-12-24 ENCOUNTER — Encounter (HOSPITAL_COMMUNITY): Payer: Self-pay | Admitting: Nurse Practitioner

## 2021-12-24 VITALS — BP 134/84 | HR 76 | Ht 73.0 in | Wt 340.0 lb

## 2021-12-24 DIAGNOSIS — D6869 Other thrombophilia: Secondary | ICD-10-CM

## 2021-12-24 DIAGNOSIS — Z7901 Long term (current) use of anticoagulants: Secondary | ICD-10-CM | POA: Insufficient documentation

## 2021-12-24 DIAGNOSIS — I5032 Chronic diastolic (congestive) heart failure: Secondary | ICD-10-CM | POA: Insufficient documentation

## 2021-12-24 DIAGNOSIS — I480 Paroxysmal atrial fibrillation: Secondary | ICD-10-CM | POA: Diagnosis not present

## 2021-12-24 DIAGNOSIS — I4891 Unspecified atrial fibrillation: Secondary | ICD-10-CM | POA: Diagnosis not present

## 2021-12-24 DIAGNOSIS — E669 Obesity, unspecified: Secondary | ICD-10-CM | POA: Diagnosis not present

## 2021-12-24 DIAGNOSIS — G4733 Obstructive sleep apnea (adult) (pediatric): Secondary | ICD-10-CM | POA: Diagnosis not present

## 2021-12-24 DIAGNOSIS — I4510 Unspecified right bundle-branch block: Secondary | ICD-10-CM | POA: Diagnosis not present

## 2021-12-24 DIAGNOSIS — I110 Hypertensive heart disease with heart failure: Secondary | ICD-10-CM | POA: Insufficient documentation

## 2021-12-24 DIAGNOSIS — Z6841 Body Mass Index (BMI) 40.0 and over, adult: Secondary | ICD-10-CM | POA: Diagnosis not present

## 2021-12-24 LAB — CBC WITH DIFFERENTIAL/PLATELET
Basophils Absolute: 0.1 10*3/uL (ref 0.0–0.2)
Basos: 1 %
EOS (ABSOLUTE): 0.2 10*3/uL (ref 0.0–0.4)
Eos: 3 %
Hematocrit: 45.4 % (ref 37.5–51.0)
Hemoglobin: 15.2 g/dL (ref 13.0–17.7)
Immature Grans (Abs): 0.1 10*3/uL (ref 0.0–0.1)
Immature Granulocytes: 1 %
Lymphocytes Absolute: 1.5 10*3/uL (ref 0.7–3.1)
Lymphs: 24 %
MCH: 29.1 pg (ref 26.6–33.0)
MCHC: 33.5 g/dL (ref 31.5–35.7)
MCV: 87 fL (ref 79–97)
Monocytes Absolute: 0.6 10*3/uL (ref 0.1–0.9)
Monocytes: 10 %
Neutrophils Absolute: 3.8 10*3/uL (ref 1.4–7.0)
Neutrophils: 61 %
Platelets: 229 10*3/uL (ref 150–450)
RBC: 5.22 x10E6/uL (ref 4.14–5.80)
RDW: 13.1 % (ref 11.6–15.4)
WBC: 6.1 10*3/uL (ref 3.4–10.8)

## 2021-12-24 LAB — LIPID PANEL
Chol/HDL Ratio: 2.7 ratio (ref 0.0–5.0)
Cholesterol, Total: 125 mg/dL (ref 100–199)
HDL: 46 mg/dL (ref >39)
LDL Chol Calc (NIH): 68 mg/dL (ref 0–99)
Triglycerides: 48 mg/dL (ref 0–149)
VLDL Cholesterol Cal: 11 mg/dL (ref 5–40)

## 2021-12-24 LAB — COMPREHENSIVE METABOLIC PANEL
ALT: 12 IU/L (ref 0–44)
AST: 12 IU/L (ref 0–40)
Albumin/Globulin Ratio: 2.2 (ref 1.2–2.2)
Albumin: 4.2 g/dL (ref 3.7–4.7)
Alkaline Phosphatase: 56 IU/L (ref 44–121)
BUN/Creatinine Ratio: 28 — ABNORMAL HIGH (ref 10–24)
BUN: 27 mg/dL (ref 8–27)
Bilirubin Total: 0.7 mg/dL (ref 0.0–1.2)
CO2: 28 mmol/L (ref 20–29)
Calcium: 9.5 mg/dL (ref 8.6–10.2)
Chloride: 103 mmol/L (ref 96–106)
Creatinine, Ser: 0.97 mg/dL (ref 0.76–1.27)
Globulin, Total: 1.9 g/dL (ref 1.5–4.5)
Glucose: 109 mg/dL — ABNORMAL HIGH (ref 70–99)
Potassium: 4.2 mmol/L (ref 3.5–5.2)
Sodium: 143 mmol/L (ref 134–144)
Total Protein: 6.1 g/dL (ref 6.0–8.5)
eGFR: 81 mL/min/{1.73_m2} (ref >59)

## 2021-12-24 LAB — TSH RFX ON ABNORMAL TO FREE T4: TSH: 4.03 u[IU]/mL (ref 0.450–4.500)

## 2021-12-24 LAB — HEMOGLOBIN A1C
Est. average glucose Bld gHb Est-mCnc: 134 mg/dL
Hgb A1c MFr Bld: 6.3 % — ABNORMAL HIGH (ref 4.8–5.6)

## 2021-12-24 NOTE — Progress Notes (Signed)
Primary Care Physician: de [place], Blondell Reveal, MD Referring Physician: Dr. Joanette Gula is a 76 y.o. male with a h/o OSA with CPAP, CHF, RBBB, aortic root dilatation, HTN, PAF is being seen in the afib clinic for feeling he went into afib sometime this week, prompted by his watch.. EKG shows afib with RBBB, at 137 bpm. He is not on anticoagulation. Previously taking xarelto, states it made him feel bad and has been off drug for some time. First occurrence of afib in some time. His wife is on eliquis and tolerates well, so he is willing to start drug as I explained that I would be limited in being able to get him back in rhythm without it and off anticoagulation increases his stroke risk with a CHA2DS2VASc score of at least 4.   He denies alcohol use, no tobacco, minimal caffeine, uses cpap but has not been sleeping well due to hip pain.    F/u afib clinic, 12/24/21. He had successful cardioversion and remains in SR today. He will remain eliquis. Price of drug discussed.   Today, he denies symptoms of palpitations, chest pain, shortness of breath, orthopnea, PND, lower extremity edema, dizziness, presyncope, syncope, or neurologic sequela. The patient is tolerating medications without difficulties and is otherwise without complaint today.   Past Medical History:  Diagnosis Date   Asthma    Cancer ([place])    mild skin   Chronic diastolic CHF (congestive heart failure) ([facility])    Morbid obesity ([facility])    OSA (obstructive sleep apnea)    PAF (paroxysmal atrial fibrillation) ([place]) 08/14/2015   s/p TEE/DCCV to NSR;  denies palplita    RBBB    atrial fib on cardizem   Past Surgical History:  Procedure Laterality Date   CARDIOVERSION N/A 05/19/2015   Procedure: CARDIOVERSION;  Surgeon: Fay Records, MD;  Location: [place];  Service: Cardiovascular;  Laterality: N/A;   CARDIOVERSION N/A 12/17/2021   Procedure: CARDIOVERSION;  Surgeon: Fay Records, MD;  Location: [facility] ENDOSCOPY;   Service: Cardiovascular;  Laterality: N/A;   CHOLECYSTECTOMY N/A 12/01/2012   Procedure: LAPAROSCOPIC CHOLECYSTECTOMY;  Surgeon: Gwenyth Ober, MD;  Location: [place];  Service: General;  Laterality: N/A;   COLONOSCOPY WITH PROPOFOL N/A 07/13/2019   Procedure: COLONOSCOPY WITH PROPOFOL;  Surgeon: Carol Ada, MD;  [facility];  Service: Endoscopy;  Laterality: N/A;   MASS EXCISION Right 02/20/2018   Procedure: RIGHT THUMB EXCISION MASS AND DEBRIDEMENT DISTAL INTRPHLANGEAL JOINT;  Surgeon: Leanora Cover, MD;  Location: [place];  Service: Orthopedics;  Laterality: Right;   POLYPECTOMY  07/13/2019   Procedure: POLYPECTOMY;  Surgeon: Carol Ada, MD;  [facility];  Service: Endoscopy;;   right knee     meniscus removal 6 years ago   SHOULDER SURGERY     TEE WITHOUT CARDIOVERSION N/A 05/19/2015   Procedure: TRANSESOPHAGEAL ECHOCARDIOGRAM (TEE);  Surgeon: Fay Records, MD;  Location: [facility] ENDOSCOPY;  Service: Cardiovascular;  Laterality: N/A;    Current Outpatient Medications  Medication Sig Dispense Refill   apixaban (ELIQUIS) 5 MG TABS tablet Take 1 tablet (5 mg total) by mouth 2 (two) times daily. 60 tablet 3   Cholecalciferol (VITAMIN D3) 2000 UNITS TABS Take 4,000 Units by mouth daily.      diltiazem (CARDIZEM CD) 120 MG 24 hr capsule Take 1 capsule (120 mg total) by mouth 2 (two) times daily. 90 capsule 0   fluticasone (FLONASE) 50 MCG/ACT nasal spray Place  1 spray into both nostrils daily as needed for allergies or rhinitis.     hydrochlorothiazide (HYDRODIURIL) 25 MG tablet Take 1 tablet (25 mg total) by mouth daily. 90 tablet 3   loratadine (CLARITIN) 10 MG tablet Take 10 mg by mouth daily.     metoprolol tartrate (LOPRESSOR) 25 MG tablet Take 1.5 tablets (37.5 mg total) by mouth 2 (two) times daily. 180 tablet 3   Multiple Vitamins-Minerals (MULTIVITAMIN ADULT) CHEW Chew 2 each by mouth daily.     Omega-3 Fatty Acids (FISH OIL) 1200 MG CAPS Take 1,200  mg by mouth daily.     rosuvastatin (CRESTOR) 5 MG tablet Take 1 tablet (5 mg total) by mouth daily. 90 tablet 0   tamsulosin (FLOMAX) 0.4 MG CAPS capsule Take 1 capsule (0.4 mg total) by mouth at bedtime. 90 capsule 0   trolamine salicylate (ASPERCREME) 10 % cream Apply 1 application topically as needed for muscle pain.     No current facility-administered medications for this encounter.    Allergies  Allergen Reactions   Lipitor [Atorvastatin] Other (See Comments)    Painful joints     Social History   Socioeconomic History   Marital status: Married    Spouse name: Not on file   Number of children: Not on file   Years of education: Not on file   Highest education level: Not on file  Occupational History   Not on file  Tobacco Use   Smoking status: Former    Years: 20.00    Types: Cigarettes   Smokeless tobacco: Never  Vaping Use   Vaping Use: Never used  Substance and Sexual Activity   Alcohol use: Yes    Alcohol/week: 0.0 standard drinks    Comment: rarely   Drug use: No   Sexual activity: Never  Other Topics Concern   Not on file  Social History Narrative   Not on file   Social Determinants of Health   Financial Resource Strain: Not on file  Food Insecurity: Not on file  Transportation Needs: Not on file  Physical Activity: Not on file  Stress: Not on file  Social Connections: Not on file  Intimate Partner Violence: Not on file    Family History  Problem Relation Age of Onset   Hypotension Mother    Heart disease Sister     ROS- All systems are reviewed and negative except as per the HPI above  Physical Exam: Vitals:   12/24/21 0849  BP: 134/84  Pulse: 76  Weight: (!) 154.2 kg  Height: '6\' 1"'$  (1.854 m)   Wt Readings from Last 3 Encounters:  12/24/21 (!) 154.2 kg  12/17/21 (!) 154.2 kg  12/02/21 (!) 158.2 kg    Labs: Lab Results  Component Value Date   NA 143 12/23/2021   K 4.2 12/23/2021   CL 103 12/23/2021   CO2 28 12/23/2021    GLUCOSE 109 (H) 12/23/2021   BUN 27 12/23/2021   CREATININE 0.97 12/23/2021   CALCIUM 9.5 12/23/2021   PHOS 3.4 05/16/2015   MG 2.2 05/16/2015   No results found for: INR Lab Results  Component Value Date   CHOL 125 12/23/2021   HDL 46 12/23/2021   LDLCALC 68 12/23/2021   TRIG 48 12/23/2021     GEN- The patient is well appearing, alert and oriented x 3 today.   Head- normocephalic, atraumatic Eyes-  Sclera clear, conjunctiva pink Ears- hearing intact Oropharynx- clear Neck- supple, no JVP Lymph- no cervical lymphadenopathy  Lungs- Clear to ausculation bilaterally, normal work of breathing Heart- regular rate and rhythm, no murmurs, rubs or gallops, PMI not laterally displaced GI- soft, NT, ND, + BS Extremities- no clubbing, cyanosis, or edema MS- no significant deformity or atrophy Skin- no rash or lesion Psych- euthymic mood, full affect Neuro- strength and sensation are intact  EKG-Vent. rate 76 BPM PR interval 146 ms QRS duration 150 ms QT/QTcB 426/479 ms P-R-T axes 48 -56 64 Sinus rhythm with Premature supraventricular complexes Right bundle branch block Left anterior fascicular block  Bifascicular block  Left ventricular hypertrophy with repolarization abnormality ( R in aVL ) Abnormal ECG When compared with ECG of 17-Dec-2021 10:02, PREVIOUS ECG IS PRESENT Epic records reviewed.    Assessment and Plan:  1. PAF Returned to afib this with RVR Cardioversion successful and remains in SR Continue  diltiazem to 120 mg bid   2. CHA2DS2VASc  score of at least 4 Continue eliquis '5mg'$  bid  Price discussed  May qualify for assistance Forms given   3. OSA Continue cpap  4. HTN Stable  4. BMI of 44.73 Weight loss encouraged  Plans to return to gym    F/u with Dr. Radford Pax as scheduled Afib clinic as needed   Butch Penny C. Felice Deem, [facility] [address] [place], [place] [address] [phone]

## 2021-12-29 ENCOUNTER — Encounter (HOSPITAL_BASED_OUTPATIENT_CLINIC_OR_DEPARTMENT_OTHER): Payer: Medicare HMO | Admitting: Family Medicine

## 2022-01-06 ENCOUNTER — Other Ambulatory Visit: Payer: Self-pay | Admitting: Cardiology

## 2022-01-06 DIAGNOSIS — H25813 Combined forms of age-related cataract, bilateral: Secondary | ICD-10-CM | POA: Diagnosis not present

## 2022-01-06 DIAGNOSIS — H35032 Hypertensive retinopathy, left eye: Secondary | ICD-10-CM | POA: Diagnosis not present

## 2022-01-06 DIAGNOSIS — H524 Presbyopia: Secondary | ICD-10-CM | POA: Diagnosis not present

## 2022-01-06 DIAGNOSIS — H40013 Open angle with borderline findings, low risk, bilateral: Secondary | ICD-10-CM | POA: Diagnosis not present

## 2022-01-11 ENCOUNTER — Telehealth (HOSPITAL_COMMUNITY): Payer: Self-pay | Admitting: *Deleted

## 2022-01-11 NOTE — Telephone Encounter (Signed)
Patient approved for Eliquis assistance through 08/08/22 Ref number [id number]

## 2022-01-19 ENCOUNTER — Encounter (HOSPITAL_BASED_OUTPATIENT_CLINIC_OR_DEPARTMENT_OTHER): Payer: Self-pay | Admitting: Orthopaedic Surgery

## 2022-01-19 ENCOUNTER — Ambulatory Visit (INDEPENDENT_AMBULATORY_CARE_PROVIDER_SITE_OTHER): Payer: Medicare HMO | Admitting: Family Medicine

## 2022-01-19 ENCOUNTER — Encounter (HOSPITAL_BASED_OUTPATIENT_CLINIC_OR_DEPARTMENT_OTHER): Payer: Self-pay | Admitting: Family Medicine

## 2022-01-19 DIAGNOSIS — Z0000 Encounter for general adult medical examination without abnormal findings: Secondary | ICD-10-CM | POA: Insufficient documentation

## 2022-01-19 MED ORDER — SHINGRIX 50 MCG/0.5ML IM SUSR: 0.5000 mL | Freq: Once | INTRAMUSCULAR | 0 refills | Status: AC

## 2022-01-19 NOTE — Assessment & Plan Note (Signed)
Routine HCM labs reviewed. HCM reviewed/discussed. Anticipatory guidance regarding healthy weight, lifestyle and choices given. Recommend healthy diet.  Recommend approximately 150 minutes/week of moderate intensity exercise Recommend regular dental and vision exams Always use seatbelt/lap and shoulder restraints Recommend using smoke alarms and checking batteries at least twice a year Recommend using sunscreen when outside Discussed recommendations for shingles vaccine.  Patient thinks he has had this completed previously Discussed tetanus immunization recommendations, patient unsure if due, will check into this

## 2022-01-19 NOTE — Progress Notes (Signed)
Subjective:    CC: Annual Physical Exam  HPI:  Victor Medina is a 76 y.o. presenting for annual physical  I reviewed the past medical history, family history, social history, surgical history, and allergies today and no changes were needed.  Please see the problem list section below in epic for further details.  Past Medical History: Past Medical History:  Diagnosis Date   Asthma    Cancer ([place])    mild skin   Chronic diastolic CHF (congestive heart failure) (HCC)    Morbid obesity (HCC)    OSA (obstructive sleep apnea)    PAF (paroxysmal atrial fibrillation) ([place]) 08/14/2015   s/p TEE/DCCV to NSR;  denies palplita    RBBB    atrial fib on cardizem   Past Surgical History: Past Surgical History:  Procedure Laterality Date   CARDIOVERSION N/A 05/19/2015   Procedure: CARDIOVERSION;  Surgeon: Fay Records, MD;  Location: [place];  Service: Cardiovascular;  Laterality: N/A;   CARDIOVERSION N/A 12/17/2021   Procedure: CARDIOVERSION;  Surgeon: Fay Records, MD;  Location: [facility] ENDOSCOPY;  Service: Cardiovascular;  Laterality: N/A;   CHOLECYSTECTOMY N/A 12/01/2012   Procedure: LAPAROSCOPIC CHOLECYSTECTOMY;  Surgeon: Gwenyth Ober, MD;  Location: [place];  Service: General;  Laterality: N/A;   COLONOSCOPY WITH PROPOFOL N/A 07/13/2019   Procedure: COLONOSCOPY WITH PROPOFOL;  Surgeon: Carol Ada, MD;  Location: [facility] ENDOSCOPY;  Service: Endoscopy;  Laterality: N/A;   MASS EXCISION Right 02/20/2018   Procedure: RIGHT THUMB EXCISION MASS AND DEBRIDEMENT DISTAL INTRPHLANGEAL JOINT;  Surgeon: Leanora Cover, MD;  Location: [place];  Service: Orthopedics;  Laterality: Right;   POLYPECTOMY  07/13/2019   Procedure: POLYPECTOMY;  Surgeon: Carol Ada, MD;  Location: [facility] ENDOSCOPY;  Service: Endoscopy;;   right knee     meniscus removal 6 years ago   SHOULDER SURGERY     TEE WITHOUT CARDIOVERSION N/A 05/19/2015   Procedure: TRANSESOPHAGEAL ECHOCARDIOGRAM (TEE);  Surgeon:  Fay Records, MD;  Location: [facility] ENDOSCOPY;  Service: Cardiovascular;  Laterality: N/A;   Social History: Social History   Socioeconomic History   Marital status: Married    Spouse name: Not on file   Number of children: Not on file   Years of education: Not on file   Highest education level: Not on file  Occupational History   Not on file  Tobacco Use   Smoking status: Former    Years: 20.00    Types: Cigarettes   Smokeless tobacco: Never  Vaping Use   Vaping Use: Never used  Substance and Sexual Activity   Alcohol use: Yes    Alcohol/week: 0.0 standard drinks of alcohol    Comment: rarely   Drug use: No   Sexual activity: Never  Other Topics Concern   Not on file  Social History Narrative   Not on file   Social Determinants of Health   Financial Resource Strain: Not on file  Food Insecurity: Not on file  Transportation Needs: Not on file  Physical Activity: Not on file  Stress: Not on file  Social Connections: Not on file   Family History: Family History  Problem Relation Age of Onset   Hypotension Mother    Heart disease Sister    Allergies: Allergies  Allergen Reactions   Lipitor [Atorvastatin] Other (See Comments)    Painful joints    Medications: See med rec.  Review of Systems: No headache, visual changes, nausea, vomiting, diarrhea, constipation, dizziness, abdominal pain, skin rash, fevers,  chills, night sweats, swollen lymph nodes, weight loss, chest pain, body aches, joint swelling, muscle aches, shortness of breath, mood changes, visual or auditory hallucinations.  Objective:    BP 128/81   Pulse 77   Ht '6\' 1"'$  (1.854 m)   Wt (!) 342 lb (155.1 kg)   SpO2 98%   BMI 45.12 kg/m   General: Well Developed, well nourished, and in no acute distress.  Neuro: Alert and oriented x3, extra-ocular muscles intact, sensation grossly intact. Cranial nerves II through XII are intact, motor, sensory, and coordinative functions are all intact. HEENT:  Normocephalic, atraumatic, pupils equal round reactive to light, neck supple, no masses, no lymphadenopathy, thyroid nonpalpable. Oropharynx, nasopharynx, external ear canals are unremarkable. Skin: Warm and dry, no rashes noted.  Cardiac: Regular rate and rhythm, no murmurs rubs or gallops.  Respiratory: Clear to auscultation bilaterally. Not using accessory muscles, speaking in full sentences.  Abdominal: Soft, nontender, nondistended, positive bowel sounds, no masses, no organomegaly.  Musculoskeletal: Shoulder, elbow, wrist, hip, knee, ankle stable, and with full range of motion.  Impression and Recommendations:    Wellness examination Routine HCM labs reviewed. HCM reviewed/discussed. Anticipatory guidance regarding healthy weight, lifestyle and choices given. Recommend healthy diet.  Recommend approximately 150 minutes/week of moderate intensity exercise Recommend regular dental and vision exams Always use seatbelt/lap and shoulder restraints Recommend using smoke alarms and checking batteries at least twice a year Recommend using sunscreen when outside Discussed recommendations for shingles vaccine.  Patient thinks he has had this completed previously Discussed tetanus immunization recommendations, patient unsure if due, will check into this Patient will obtain vaccination record from [facility] and bring to office for [place] to review and determine any recommended immunizations  Return in about 3 months (around 04/21/2022) for HTN.   ___________________________________________ Anaiz Qazi de [place], MD, ABFM, CAQSM [facility]

## 2022-02-04 ENCOUNTER — Ambulatory Visit (HOSPITAL_BASED_OUTPATIENT_CLINIC_OR_DEPARTMENT_OTHER): Payer: Medicare HMO | Admitting: Orthopaedic Surgery

## 2022-02-04 DIAGNOSIS — M25562 Pain in left knee: Secondary | ICD-10-CM | POA: Diagnosis not present

## 2022-02-04 DIAGNOSIS — M25561 Pain in right knee: Secondary | ICD-10-CM

## 2022-02-04 DIAGNOSIS — M1712 Unilateral primary osteoarthritis, left knee: Secondary | ICD-10-CM

## 2022-02-04 DIAGNOSIS — M1711 Unilateral primary osteoarthritis, right knee: Secondary | ICD-10-CM | POA: Diagnosis not present

## 2022-02-04 MED ORDER — LIDOCAINE HCL 1 % IJ SOLN
4.0000 mL | INTRAMUSCULAR | Status: AC | PRN
  Administered 2022-02-04: 4 mL

## 2022-02-04 MED ORDER — TRIAMCINOLONE ACETONIDE 40 MG/ML IJ SUSP
80.0000 mg | INTRAMUSCULAR | Status: AC | PRN
  Administered 2022-02-04: 80 mg via INTRA_ARTICULAR

## 2022-02-04 NOTE — Progress Notes (Signed)
Chief Complaint: Bilateral knee pain     History of Present Illness:   02/04/2022: Presents today for follow-up of his bilateral knee osteoarthritis.  He has been working on weight loss and has been already able to lose 20 pounds he.  He is hopeful to get additional knee injections in order to help him continue to work through this.  Victor Medina is a 76 y.o. male presents with chronic knee pain ongoing now both knees for 10 years.  He is having significant pain with going up and down stairs.  He is status post a left knee medial meniscectomy which was done many years prior.  Since that time he has had persistent pain and crepitus about the knees.  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  He has previously had gel injections in both knees again many years prior with no relief.  He is a member at scheduled gym and is working on staying active although his knee pain is limiting his ability to do aerobic exercise.  He has pain with even going on the bike    Surgical History:   None  PMH/PSH/Family History/Social History/Meds/Allergies:    Past Medical History:  Diagnosis Date   Asthma    Cancer ([place])    mild skin   Chronic diastolic CHF (congestive heart failure) (HCC)    Morbid obesity (HCC)    OSA (obstructive sleep apnea)    PAF (paroxysmal atrial fibrillation) ([place]) 08/14/2015   s/p TEE/DCCV to NSR;  denies palplita    RBBB    atrial fib on cardizem   Past Surgical History:  Procedure Laterality Date   CARDIOVERSION N/A 05/19/2015   Procedure: CARDIOVERSION;  Surgeon: Fay Records, MD;  Location: [place];  Service: Cardiovascular;  Laterality: N/A;   CARDIOVERSION N/A 12/17/2021   Procedure: CARDIOVERSION;  Surgeon: Fay Records, MD;  Location[facility] ENDOSCOPY;  Service: Cardiovascular;  Laterality: N/A;   CHOLECYSTECTOMY N/A 12/01/2012   Procedure: LAPAROSCOPIC CHOLECYSTECTOMY;  Surgeon: Gwenyth Ober, MD;  Location: [place];   Service: General;  Laterality: N/A;   COLONOSCOPY WITH PROPOFOL N/A 07/13/2019   Procedure: COLONOSCOPY WITH PROPOFOL;  Surgeon: Carol Ada, MD;  Location: [facility] ENDOSCOPY;  Service: Endoscopy;  Laterality: N/A;   MASS EXCISION Right 02/20/2018   Procedure: RIGHT THUMB EXCISION MASS AND DEBRIDEMENT DISTAL INTRPHLANGEAL JOINT;  Surgeon: Leanora Cover, MD;  Location: [place];  Service: Orthopedics;  Laterality: Right;   POLYPECTOMY  07/13/2019   Procedure: POLYPECTOMY;  Surgeon: Carol Ada, MD;  Location: [facility] ENDOSCOPY;  Service: Endoscopy;;   right knee     meniscus removal 6 years ago   SHOULDER SURGERY     TEE WITHOUT CARDIOVERSION N/A 05/19/2015   Procedure: TRANSESOPHAGEAL ECHOCARDIOGRAM (TEE);  Surgeon: Fay Records, MD;  Location: [facility] ENDOSCOPY;  Service: Cardiovascular;  Laterality: N/A;   Social History   Socioeconomic History   Marital status: Married    Spouse name: Not on file   Number of children: Not on file   Years of education: Not on file   Highest education level: Not on file  Occupational History   Not on file  Tobacco Use   Smoking status: Former    Years: 20.00    Types: Cigarettes   Smokeless tobacco: Never  Vaping Use  Vaping Use: Never used  Substance and Sexual Activity   Alcohol use: Yes    Alcohol/week: 0.0 standard drinks of alcohol    Comment: rarely   Drug use: No   Sexual activity: Never  Other Topics Concern   Not on file  Social History Narrative   Not on file   Social Determinants of Health   Financial Resource Strain: Not on file  Food Insecurity: Not on file  Transportation Needs: Not on file  Physical Activity: Not on file  Stress: Not on file  Social Connections: Not on file   Family History  Problem Relation Age of Onset   Hypotension Mother    Heart disease Sister    Allergies  Allergen Reactions   Lipitor [Atorvastatin] Other (See Comments)    Painful joints    Current Outpatient Medications   Medication Sig Dispense Refill   apixaban (ELIQUIS) 5 MG TABS tablet Take 1 tablet (5 mg total) by mouth 2 (two) times daily. 60 tablet 3   Cholecalciferol (VITAMIN D3) 2000 UNITS TABS Take 4,000 Units by mouth daily.      diltiazem (CARDIZEM CD) 120 MG 24 hr capsule Take 1 capsule (120 mg total) by mouth 2 (two) times daily. 90 capsule 0   fluticasone (FLONASE) 50 MCG/ACT nasal spray Place 1 spray into both nostrils daily as needed for allergies or rhinitis.     hydrochlorothiazide (HYDRODIURIL) 25 MG tablet Take 1 tablet (25 mg total) by mouth daily. 90 tablet 3   loratadine (CLARITIN) 10 MG tablet Take 10 mg by mouth daily.     metoprolol tartrate (LOPRESSOR) 25 MG tablet Take 1.5 tablets by mouth twice daily 270 tablet 1   Multiple Vitamins-Minerals (MULTIVITAMIN ADULT) CHEW Chew 2 each by mouth daily.     Omega-3 Fatty Acids (FISH OIL) 1200 MG CAPS Take 1,200 mg by mouth daily.     rosuvastatin (CRESTOR) 5 MG tablet Take 1 tablet (5 mg total) by mouth daily. 90 tablet 0   tamsulosin (FLOMAX) 0.4 MG CAPS capsule Take 1 capsule (0.4 mg total) by mouth at bedtime. 90 capsule 0   trolamine salicylate (ASPERCREME) 10 % cream Apply 1 application topically as needed for muscle pain.     No current facility-administered medications for this visit.   No results found.  Review of Systems:   A ROS was performed including pertinent positives and negatives as documented in the HPI.  Physical Exam :   Constitutional: NAD and appears stated age Neurological: Alert and oriented Psych: Appropriate affect and cooperative There were no vitals taken for this visit.   Comprehensive Musculoskeletal Exam:      Musculoskeletal Exam  Gait Normal  Alignment Normal   Right Left  Inspection Normal Normal  Palpation    Tenderness Tricompartmental Tricompartmental  Crepitus Positive Positive  Effusion None None  Range of Motion    Extension 0 0  Flexion 135 135  Strength    Extension 5/5 5/5   Flexion 5/5 5/5  Ligament Exam     Generalized Laxity No No  Lachman Negative Negative   Pivot Shift Negative Negative  Anterior Drawer Negative Negative  Valgus at 0 Negative Negative  Valgus at 20 Negative Negative  Varus at 0 0 0  Varus at 20   0 0  Posterior Drawer at 90 0 0  Vascular/Lymphatic Exam    Edema None None  Venous Stasis Changes No No  Distal Circulation Normal Normal  Neurologic    Light Touch  Sensation Intact Intact  Special Tests:      Imaging:   Xray (4 views right knee, 4 views left knee): Left worse than right tricompartmental moderate to severe osteoarthritis   I personally reviewed and interpreted the radiographs.   Assessment:   76 year old male with left worse than right knee tricompartmental osteoarthritis.  Both knees predominantly involve the medial joint space.  At today's visit I have counseled them that I do believe he would ultimately be a candidate for knee replacement.  We did discuss that with a BMI of 46 this would be the only potentially preclusive thing for total knee arthroplasty.  He would like an additional 2 knee injections in order to help continue to work through weight loss.  I performed this today for him.   Plan :    -Bilateral ultrasound-guided knee injections performed today -Plan for referral to total joint arthroplasty team    Procedure Note  Patient: Victor Medina             Date of Birth: Dec 19, 1945           MRN: [id number]             Visit Date: 02/04/2022  Procedures: Visit Diagnoses:  No diagnosis found.   Large Joint Inj: R knee on 02/04/2022 12:21 PM Indications: pain Details: 22 G 1.5 in needle, ultrasound-guided anterior approach  Arthrogram: No  Medications: 4 mL lidocaine 1 %; 80 mg triamcinolone acetonide 40 MG/ML Outcome: tolerated well, no immediate complications Procedure, treatment alternatives, risks and benefits explained, specific risks discussed. Consent was given by the patient.  Immediately prior to procedure a time out was called to verify the correct patient, procedure, equipment, support staff and site/side marked as required. Patient was prepped and draped in the usual sterile fashion.    Large Joint Inj: L knee on 02/04/2022 12:21 PM Indications: pain Details: 22 G 1.5 in needle, ultrasound-guided anterior approach  Arthrogram: No  Medications: 4 mL lidocaine 1 %; 80 mg triamcinolone acetonide 40 MG/ML Outcome: tolerated well, no immediate complications Procedure, treatment alternatives, risks and benefits explained, specific risks discussed. Consent was given by the patient. Immediately prior to procedure a time out was called to verify the correct patient, procedure, equipment, support staff and site/side marked as required. Patient was prepped and draped in the usual sterile fashion.          I personally saw and evaluated the patient, and participated in the management and treatment plan.  Vanetta Mulders, MD Attending Physician, Orthopedic Surgery  This document was dictated using Dragon voice recognition software. A reasonable attempt at proof reading has been made to minimize errors.

## 2022-02-06 ENCOUNTER — Other Ambulatory Visit (HOSPITAL_BASED_OUTPATIENT_CLINIC_OR_DEPARTMENT_OTHER): Payer: Self-pay | Admitting: Family Medicine

## 2022-02-06 DIAGNOSIS — I5032 Chronic diastolic (congestive) heart failure: Secondary | ICD-10-CM

## 2022-03-02 ENCOUNTER — Encounter: Payer: Self-pay | Admitting: Cardiology

## 2022-03-02 DIAGNOSIS — G4733 Obstructive sleep apnea (adult) (pediatric): Secondary | ICD-10-CM

## 2022-03-09 DIAGNOSIS — H5211 Myopia, right eye: Secondary | ICD-10-CM | POA: Diagnosis not present

## 2022-03-09 DIAGNOSIS — H43813 Vitreous degeneration, bilateral: Secondary | ICD-10-CM | POA: Diagnosis not present

## 2022-03-09 DIAGNOSIS — H25811 Combined forms of age-related cataract, right eye: Secondary | ICD-10-CM | POA: Diagnosis not present

## 2022-03-09 DIAGNOSIS — Z9989 Dependence on other enabling machines and devices: Secondary | ICD-10-CM | POA: Diagnosis not present

## 2022-03-09 DIAGNOSIS — H5202 Hypermetropia, left eye: Secondary | ICD-10-CM | POA: Diagnosis not present

## 2022-03-09 DIAGNOSIS — H47093 Other disorders of optic nerve, not elsewhere classified, bilateral: Secondary | ICD-10-CM | POA: Diagnosis not present

## 2022-03-09 DIAGNOSIS — H52203 Unspecified astigmatism, bilateral: Secondary | ICD-10-CM | POA: Diagnosis not present

## 2022-03-09 DIAGNOSIS — H2512 Age-related nuclear cataract, left eye: Secondary | ICD-10-CM | POA: Diagnosis not present

## 2022-03-09 DIAGNOSIS — G4733 Obstructive sleep apnea (adult) (pediatric): Secondary | ICD-10-CM | POA: Diagnosis not present

## 2022-03-09 DIAGNOSIS — H0259 Other disorders affecting eyelid function: Secondary | ICD-10-CM | POA: Diagnosis not present

## 2022-03-09 DIAGNOSIS — H5703 Miosis: Secondary | ICD-10-CM | POA: Insufficient documentation

## 2022-03-09 DIAGNOSIS — H524 Presbyopia: Secondary | ICD-10-CM | POA: Diagnosis not present

## 2022-03-25 ENCOUNTER — Other Ambulatory Visit: Payer: Self-pay | Admitting: Cardiology

## 2022-03-25 ENCOUNTER — Other Ambulatory Visit (HOSPITAL_BASED_OUTPATIENT_CLINIC_OR_DEPARTMENT_OTHER): Payer: Self-pay | Admitting: Family Medicine

## 2022-03-25 DIAGNOSIS — E7800 Pure hypercholesterolemia, unspecified: Secondary | ICD-10-CM

## 2022-03-25 DIAGNOSIS — I5032 Chronic diastolic (congestive) heart failure: Secondary | ICD-10-CM

## 2022-03-25 DIAGNOSIS — I10 Essential (primary) hypertension: Secondary | ICD-10-CM

## 2022-03-28 ENCOUNTER — Other Ambulatory Visit: Payer: Self-pay | Admitting: Cardiology

## 2022-04-02 ENCOUNTER — Ambulatory Visit (INDEPENDENT_AMBULATORY_CARE_PROVIDER_SITE_OTHER): Payer: Medicare HMO

## 2022-04-02 ENCOUNTER — Encounter (HOSPITAL_BASED_OUTPATIENT_CLINIC_OR_DEPARTMENT_OTHER): Payer: Self-pay

## 2022-04-02 DIAGNOSIS — Z0000 Encounter for general adult medical examination without abnormal findings: Secondary | ICD-10-CM

## 2022-04-02 NOTE — Patient Instructions (Signed)
Health Maintenance, Male Adopting a healthy lifestyle and getting preventive care are important in promoting health and wellness. Ask your health care provider about: The right schedule for you to have regular tests and exams. Things you can do on your own to prevent diseases and keep yourself healthy. What should I know about diet, weight, and exercise? Eat a healthy diet  Eat a diet that includes plenty of vegetables, fruits, low-fat dairy products, and lean protein. Do not eat a lot of foods that are high in solid fats, added sugars, or sodium. Maintain a healthy weight Body mass index (BMI) is a measurement that can be used to identify possible weight problems. It estimates body fat based on height and weight. Your health care provider can help determine your BMI and help you achieve or maintain a healthy weight. Get regular exercise Get regular exercise. This is one of the most important things you can do for your health. Most adults should: Exercise for at least 150 minutes each week. The exercise should increase your heart rate and make you sweat (moderate-intensity exercise). Do strengthening exercises at least twice a week. This is in addition to the moderate-intensity exercise. Spend less time sitting. Even light physical activity can be beneficial. Watch cholesterol and blood lipids Have your blood tested for lipids and cholesterol at 76 years of age, then have this test every 5 years. You may need to have your cholesterol levels checked more often if: Your lipid or cholesterol levels are high. You are older than 76 years of age. You are at high risk for heart disease. What should I know about cancer screening? Many types of cancers can be detected early and may often be prevented. Depending on your health history and family history, you may need to have cancer screening at various ages. This may include screening for: Colorectal cancer. Prostate cancer. Skin cancer. Lung  cancer. What should I know about heart disease, diabetes, and high blood pressure? Blood pressure and heart disease High blood pressure causes heart disease and increases the risk of stroke. This is more likely to develop in people who have high blood pressure readings or are overweight. Talk with your health care provider about your target blood pressure readings. Have your blood pressure checked: Every 3-5 years if you are 18-39 years of age. Every year if you are 40 years old or older. If you are between the ages of 65 and 75 and are a current or former smoker, ask your health care provider if you should have a one-time screening for abdominal aortic aneurysm (AAA). Diabetes Have regular diabetes screenings. This checks your fasting blood sugar level. Have the screening done: Once every three years after age 45 if you are at a normal weight and have a low risk for diabetes. More often and at a younger age if you are overweight or have a high risk for diabetes. What should I know about preventing infection? Hepatitis B If you have a higher risk for hepatitis B, you should be screened for this virus. Talk with your health care provider to find out if you are at risk for hepatitis B infection. Hepatitis C Blood testing is recommended for: Everyone born from 1945 through 1965. Anyone with known risk factors for hepatitis C. Sexually transmitted infections (STIs) You should be screened each year for STIs, including gonorrhea and chlamydia, if: You are sexually active and are younger than 76 years of age. You are older than 76 years of age and your   health care provider tells you that you are at risk for this type of infection. Your sexual activity has changed since you were last screened, and you are at increased risk for chlamydia or gonorrhea. Ask your health care provider if you are at risk. Ask your health care provider about whether you are at high risk for HIV. Your health care provider  may recommend a prescription medicine to help prevent HIV infection. If you choose to take medicine to prevent HIV, you should first get tested for HIV. You should then be tested every 3 months for as long as you are taking the medicine. Follow these instructions at home: Alcohol use Do not drink alcohol if your health care provider tells you not to drink. If you drink alcohol: Limit how much you have to 0-2 drinks a day. Know how much alcohol is in your drink. In the [place] one drink equals one 12 oz bottle of beer (355 mL), one 5 oz glass of wine (148 mL), or one 1 oz glass of hard liquor (44 mL). Lifestyle Do not use any products that contain nicotine or tobacco. These products include cigarettes, chewing tobacco, and vaping devices, such as e-cigarettes. If you need help quitting, ask your health care provider. Do not use street drugs. Do not share needles. Ask your health care provider for help if you need support or information about quitting drugs. General instructions Schedule regular health, dental, and eye exams. Stay current with your vaccines. Tell your health care provider if: You often feel depressed. You have ever been abused or do not feel safe at home. Summary Adopting a healthy lifestyle and getting preventive care are important in promoting health and wellness. Follow your health care provider's instructions about healthy diet, exercising, and getting tested or screened for diseases. Follow your health care provider's instructions on monitoring your cholesterol and blood pressure. This information is not intended to replace advice given to you by your health care provider. Make sure you discuss any questions you have with your health care provider. Document Revised: 12/15/2020 Document Reviewed: 12/15/2020 Elsevier Patient Education  2023 [employer]  

## 2022-04-02 NOTE — Progress Notes (Signed)
Subjective:   Victor Medina is a 76 y.o. male who presents for an Initial Medicare Annual Wellness Visit. I connected with  Victor Medina on 04/02/22 by a audio enabled telemedicine application and verified that I am speaking with the correct person using two identifiers.  Patient Location: Home  Provider Location: Home Office  I discussed the limitations of evaluation and management by telemedicine. The patient expressed understanding and agreed to proceed.     Objective:    Today's Vitals   There is no height or weight on file to calculate BMI.     07/13/2019    7:08 AM 02/20/2018   12:23 PM 02/14/2018    2:36 PM 08/20/2014    1:33 AM 11/30/2012    5:00 AM 11/30/2012    1:07 AM  Advanced Directives  Does Patient Have a Medical Advance Directive? No No No No Patient does not have advance directive Patient would like information  Would patient like information on creating a medical advance directive?  No - Patient declined No - Patient declined       Current Medications (verified) Outpatient Encounter Medications as of 04/02/2022  Medication Sig   apixaban (ELIQUIS) 5 MG TABS tablet Take 1 tablet (5 mg total) by mouth 2 (two) times daily.   Cholecalciferol (VITAMIN D3) 2000 UNITS TABS Take 4,000 Units by mouth daily.    diltiazem (CARDIZEM CD) 120 MG 24 hr capsule TAKE 1 CAPSULE BY MOUTH 2 TIMES DAILY.   fluticasone (FLONASE) 50 MCG/ACT nasal spray Place 1 spray into both nostrils daily as needed for allergies or rhinitis.   hydrochlorothiazide (HYDRODIURIL) 25 MG tablet TAKE 1 TABLET BY MOUTH EVERY DAY   loratadine (CLARITIN) 10 MG tablet Take 10 mg by mouth daily.   metoprolol tartrate (LOPRESSOR) 25 MG tablet Take 1.5 tablets by mouth twice daily   Multiple Vitamins-Minerals (MULTIVITAMIN ADULT) CHEW Chew 2 each by mouth daily.   Omega-3 Fatty Acids (FISH OIL) 1200 MG CAPS Take 1,200 mg by mouth daily.   rosuvastatin (CRESTOR) 5 MG tablet TAKE 1 TABLET (5 MG TOTAL) BY  MOUTH DAILY.   tamsulosin (FLOMAX) 0.4 MG CAPS capsule TAKE 1 CAPSULE BY MOUTH EVERYDAY AT BEDTIME   trolamine salicylate (ASPERCREME) 10 % cream Apply 1 application topically as needed for muscle pain.   No facility-administered encounter medications on file as of 04/02/2022.    Allergies (verified) Lipitor [atorvastatin]   History: Past Medical History:  Diagnosis Date   Asthma    Cancer ([place])    mild skin   Chronic diastolic CHF (congestive heart failure) (HCC)    Morbid obesity (HCC)    OSA (obstructive sleep apnea)    PAF (paroxysmal atrial fibrillation) ([place]) 08/14/2015   s/p TEE/DCCV to NSR;  denies palplita    RBBB    atrial fib on cardizem   Past Surgical History:  Procedure Laterality Date   CARDIOVERSION N/A 05/19/2015   Procedure: CARDIOVERSION;  Surgeon: Fay Records, MD;  Location: [facility];  Service: Cardiovascular;  Laterality: N/A;   CARDIOVERSION N/A 12/17/2021   Procedure: CARDIOVERSION;  Surgeon: Fay Records, MD;  Location: [facility] ENDOSCOPY;  Service: Cardiovascular;  Laterality: N/A;   CHOLECYSTECTOMY N/A 12/01/2012   Procedure: LAPAROSCOPIC CHOLECYSTECTOMY;  Surgeon: Gwenyth Ober, MD;  Location: [facility];  Service: General;  Laterality: N/A;   COLONOSCOPY WITH PROPOFOL N/A 07/13/2019   Procedure: COLONOSCOPY WITH PROPOFOL;  Surgeon: Carol Ada, MD;  Location: [facility] ENDOSCOPY;  Service: Endoscopy;  Laterality:  N/A;   MASS EXCISION Right 02/20/2018   Procedure: RIGHT THUMB EXCISION MASS AND DEBRIDEMENT DISTAL INTRPHLANGEAL JOINT;  Surgeon: Leanora Cover, MD;  Location: [facility];  Service: Orthopedics;  Laterality: Right;   POLYPECTOMY  07/13/2019   Procedure: POLYPECTOMY;  Surgeon: Carol Ada, MD;  Location: [facility] ENDOSCOPY;  Service: Endoscopy;;   right knee     meniscus removal 6 years ago   SHOULDER SURGERY     TEE WITHOUT CARDIOVERSION N/A 05/19/2015   Procedure: TRANSESOPHAGEAL ECHOCARDIOGRAM (TEE);  Surgeon: Fay Records, MD;  Location: [facility]  ENDOSCOPY;  Service: Cardiovascular;  Laterality: N/A;   Family History  Problem Relation Age of Onset   Hypotension Mother    Heart disease Sister    Social History   Socioeconomic History   Marital status: Married    Spouse name: Not on file   Number of children: Not on file   Years of education: Not on file   Highest education level: Not on file  Occupational History   Not on file  Tobacco Use   Smoking status: Former    Years: 20.00    Types: Cigarettes   Smokeless tobacco: Never  Vaping Use   Vaping Use: Never used  Substance and Sexual Activity   Alcohol use: Yes    Alcohol/week: 0.0 standard drinks of alcohol    Comment: rarely   Drug use: No   Sexual activity: Never  Other Topics Concern   Not on file  Social History Narrative   Not on file   Social Determinants of Health   Financial Resource Strain: Low Risk  (04/02/2022)   Overall Financial Resource Strain (CARDIA)    Difficulty of Paying Living Expenses: Not hard at all  Food Insecurity: No Food Insecurity (04/02/2022)   Hunger Vital Sign    Worried About Running Out of Food in the Last Year: Never true    Ran Out of Food in the Last Year: Never true  Transportation Needs: No Transportation Needs (04/02/2022)   PRAPARE - Hydrologist (Medical): No    Lack of Transportation (Non-Medical): No  Physical Activity: Sufficiently Active (04/02/2022)   Exercise Vital Sign    Days of Exercise per Week: 3 days    Minutes of Exercise per Session: 60 min  Stress: No Stress Concern Present (04/02/2022)   [place]    Feeling of Stress : Not at all  Social Connections: Moderately Isolated (04/02/2022)   Social Connection and Isolation Panel [NHANES]    Frequency of Communication with Friends and Family: Twice a week    Frequency of Social Gatherings with Friends and Family: Once a week    Attends Religious Services: Never     Marine scientist or Organizations: No    Attends Music therapist: Never    Marital Status: Married    Tobacco Counseling Counseling given: Yes   Clinical Intake:     Pain : No/denies pain     Diabetes: No  How often do you need to have someone help you when you read instructions, pamphlets, or other written materials from your doctor or pharmacy?: 1 - Never What 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 1 1  Vision? 1 1  Difficulty concentrating or making decisions? 0 0  Walking or climbing stairs? 0 1  Dressing or bathing? 0 0  Doing errands, shopping? 0 0  Preparing Food and eating ?  N  Using the Toilet?  N  In the past six months, have you accidently leaked urine?  Y  Do you have problems with loss of bowel control?  N  Managing your Medications?  N  Managing your Finances?  N  Housekeeping or managing your Housekeeping?  N    Patient Care Team: [place], Blondell Reveal, MD as PCP - General (Family Medicine) Sueanne Margarita, MD as PCP - Cardiology (Cardiology)  Indicate any recent Medical Services you may have received from other than [facility] providers in the past year (date may be approximate).     Assessment:   This is a routine wellness examination for K[place].  Hearing/Vision screen No results found.  Dietary issues and exercise activities discussed:     Goals Addressed   None   Depression Screen    04/02/2022   12:26 PM 01/19/2022   11:22 AM  PHQ 2/9 Scores  PHQ - 2 Score 0 0  Exception Documentation  Medical reason    Fall Risk    04/02/2022   12:28 PM 04/01/2022   12:36 PM 01/19/2022   11:21 AM  Fall Risk   Falls in the past year? 1 1 0  Number falls in past yr: 1 1 0   Injury with Fall? 0 0 0  Risk for fall due to : History of fall(s)  No Fall Risks  Follow up Falls evaluation completed  Falls evaluation completed    [place]:  Any stairs in or around the home? No  If so, are there any without handrails? No  Home free of loose throw rugs in walkways, pet beds, electrical cords, etc? Yes  Adequate lighting in your home to reduce risk of falls? Yes   ASSISTIVE DEVICES UTILIZED TO PREVENT FALLS:  Life alert? No  Use of a cane, walker or w/c? No  Grab bars in the bathroom? No  Shower chair or bench in shower? No  Elevated toilet seat or a handicapped toilet? No    Cognitive Function:        04/02/2022   12:30 PM  6CIT Screen  What Year? 0 points  What month? 0 points  What time? 0 points  Count back from 20 0 points  Months in reverse 0 points  Repeat phrase 2 points  Total Score 2 points    Immunizations Immunization History  Administered Date(s) Administered   Fluad Quad(high Dose 65+) 04/18/2019   Influenza, High Dose Seasonal PF 03/09/2016, 06/06/2018, 04/18/2019, 04/15/2020, 05/13/2021   PFIZER Comirnaty(Gray Top)Covid-19 Tri-Sucrose Vaccine 11/13/2020   PFIZER(Purple Top)SARS-COV-2 Vaccination 09/15/2019, 09/25/2019, 05/06/2020   Pfizer Covid-19 Vaccine Bivalent Booster 31yr & up 05/13/2021   Pneumococcal Conjugate-13 10/21/2010   Pneumococcal Polysaccharide-23 06/24/2017, 12/08/2020   Zoster, Live 10/20/2005    TDAP status: Due, Education has been provided regarding the importance of this vaccine. Advised may receive this vaccine at local pharmacy or Health Dept. Aware to provide a copy of the vaccination record if obtained from local pharmacy or Health Dept. Verbalized acceptance and understanding.  Flu Vaccine status: Due, Education has been provided regarding the importance of this vaccine. Advised may receive this vaccine at  local pharmacy or Health Dept. Aware to provide a copy of the  vaccination record if obtained from local pharmacy or Health Dept. Verbalized acceptance and understanding.  Pneumococcal vaccine status: Up to date  Covid-19 vaccine status: Completed vaccines  Qualifies for Shingles Vaccine? Yes   Zostavax completed No   Shingrix Completed?: No.    Education has been provided regarding the importance of this vaccine. Patient has been advised to call insurance company to determine out of pocket expense if they have not yet received this vaccine. Advised may also receive vaccine at local pharmacy or Health Dept. Verbalized acceptance and understanding.  Screening Tests Health Maintenance  Topic Date Due   Hepatitis C Screening  Never done   TETANUS/TDAP  Never done   Zoster Vaccines- Shingrix (1 of 2) Never done   COVID-19 Vaccine (6 - Pfizer series) 09/13/2021   INFLUENZA VACCINE  03/09/2022   Pneumonia Vaccine 36+ Years old  Completed   HPV VACCINES  Aged Out   COLONOSCOPY (Pts 45-33yr Insurance coverage will need to be confirmed)  Discontinued    Health Maintenance  Health Maintenance Due  Topic Date Due   Hepatitis C Screening  Never done   TETANUS/TDAP  Never done   Zoster Vaccines- Shingrix (1 of 2) Never done   COVID-19 Vaccine (6 - Pfizer series) 09/13/2021   INFLUENZA VACCINE  03/09/2022    Colorectal cancer screening: No longer required.   Lung Cancer Screening: (Low Dose CT Chest recommended if Age 76-80years, 30 pack-year currently smoking OR have quit w/in 15years.) does not qualify.   Lung Cancer Screening Referral:  n/a  Additional Screening:  Hepatitis C Screening: does qualify; Completed due   Vision Screening: Recommended annual ophthalmology exams for early detection of glaucoma and other disorders of the eye. Is the patient up to date with their annual eye exam?  Yes  Who is the provider or what is the name of the office in which the patient attends annual eye exams? Dr hHerbert Deaner If pt is not established with a  provider, would they like to be referred to a provider to establish care? No .   Dental Screening: Recommended annual dental exams for proper oral hygiene  Community Resource Referral / Chronic Care Management: CRR required this visit?  No   CCM required this visit?  No      Plan:     I have personally reviewed and noted the following in the patient's chart:   Medical and social history Use of alcohol, tobacco or illicit drugs  Current medications and supplements including opioid prescriptions. Patient is not currently taking opioid prescriptions. Functional ability and status Nutritional status Physical activity Advanced directives List of other physicians Hospitalizations, surgeries, and ER visits in previous 12 months Vitals Screenings to include cognitive, depression, and falls Referrals and appointments  In addition, I have reviewed and discussed with patient certain preventive protocols, quality metrics, and best practice recommendations. A written personalized care plan for preventive services as well as general preventive health recommendations were provided to patient.     VDebbora Dus COregon  04/02/2022   Nurse Notes: provider notified

## 2022-04-06 ENCOUNTER — Telehealth: Payer: Self-pay | Admitting: *Deleted

## 2022-04-06 DIAGNOSIS — G4733 Obstructive sleep apnea (adult) (pediatric): Secondary | ICD-10-CM

## 2022-04-06 DIAGNOSIS — I5032 Chronic diastolic (congestive) heart failure: Secondary | ICD-10-CM

## 2022-04-06 DIAGNOSIS — I10 Essential (primary) hypertension: Secondary | ICD-10-CM

## 2022-04-06 NOTE — Telephone Encounter (Signed)
I just a notice that my ResMed S9 unit is failing. I need to order a new unit. Do I need a prescription to go to Medicare to order it  Thank you Victor Medina. Victor Medina

## 2022-04-06 NOTE — Telephone Encounter (Signed)
I have a sept 8 appointment, as of this week the unit I currently have is failing fast  Gae Bon:  I ordered it and patient will pick up his order at the [address] office.

## 2022-04-08 ENCOUNTER — Emergency Department (HOSPITAL_BASED_OUTPATIENT_CLINIC_OR_DEPARTMENT_OTHER)
Admission: EM | Admit: 2022-04-08 | Discharge: 2022-04-08 | Disposition: A | Discharge status: Home / Self Care | Payer: Medicare HMO | Attending: Emergency Medicine | Admitting: Emergency Medicine

## 2022-04-08 ENCOUNTER — Encounter (HOSPITAL_BASED_OUTPATIENT_CLINIC_OR_DEPARTMENT_OTHER): Payer: Self-pay

## 2022-04-08 ENCOUNTER — Other Ambulatory Visit: Payer: Self-pay

## 2022-04-08 ENCOUNTER — Emergency Department (HOSPITAL_BASED_OUTPATIENT_CLINIC_OR_DEPARTMENT_OTHER): Payer: Medicare HMO | Admitting: Radiology

## 2022-04-08 DIAGNOSIS — Z20822 Contact with and (suspected) exposure to covid-19: Secondary | ICD-10-CM | POA: Insufficient documentation

## 2022-04-08 DIAGNOSIS — I110 Hypertensive heart disease with heart failure: Secondary | ICD-10-CM | POA: Diagnosis not present

## 2022-04-08 DIAGNOSIS — Z7951 Long term (current) use of inhaled steroids: Secondary | ICD-10-CM | POA: Insufficient documentation

## 2022-04-08 DIAGNOSIS — Z7901 Long term (current) use of anticoagulants: Secondary | ICD-10-CM | POA: Insufficient documentation

## 2022-04-08 DIAGNOSIS — Z79899 Other long term (current) drug therapy: Secondary | ICD-10-CM | POA: Insufficient documentation

## 2022-04-08 DIAGNOSIS — J45909 Unspecified asthma, uncomplicated: Secondary | ICD-10-CM | POA: Insufficient documentation

## 2022-04-08 DIAGNOSIS — I5032 Chronic diastolic (congestive) heart failure: Secondary | ICD-10-CM | POA: Insufficient documentation

## 2022-04-08 DIAGNOSIS — Z85828 Personal history of other malignant neoplasm of skin: Secondary | ICD-10-CM | POA: Insufficient documentation

## 2022-04-08 DIAGNOSIS — R000 Tachycardia, unspecified: Secondary | ICD-10-CM | POA: Diagnosis not present

## 2022-04-08 LAB — URINALYSIS, ROUTINE W REFLEX MICROSCOPIC
Bilirubin Urine: NEGATIVE
Glucose, UA: NEGATIVE mg/dL
Hgb urine dipstick: NEGATIVE
Ketones, ur: NEGATIVE mg/dL
Nitrite: NEGATIVE
Protein, ur: NEGATIVE mg/dL
Specific Gravity, Urine: 1.023 (ref 1.005–1.030)
pH: 5.5 (ref 5.0–8.0)

## 2022-04-08 LAB — BASIC METABOLIC PANEL
Anion gap: 9 (ref 5–15)
BUN: 27 mg/dL — ABNORMAL HIGH (ref 8–23)
CO2: 28 mmol/L (ref 22–32)
Calcium: 9.8 mg/dL (ref 8.9–10.3)
Chloride: 102 mmol/L (ref 98–111)
Creatinine, Ser: 1.19 mg/dL (ref 0.61–1.24)
GFR, Estimated: >60 mL/min (ref >60)
Glucose, Bld: 140 mg/dL — ABNORMAL HIGH (ref 70–99)
Potassium: 3.7 mmol/L (ref 3.5–5.1)
Sodium: 139 mmol/L (ref 135–145)

## 2022-04-08 LAB — TROPONIN I (HIGH SENSITIVITY)
Troponin I (High Sensitivity): 9 ng/L (ref <18)
Troponin I (High Sensitivity): 17 ng/L (ref <18)

## 2022-04-08 LAB — SARS CORONAVIRUS 2 BY RT PCR: SARS Coronavirus 2 by RT PCR: NEGATIVE

## 2022-04-08 LAB — CBC
HCT: 47.6 % (ref 39.0–52.0)
Hemoglobin: 15.8 g/dL (ref 13.0–17.0)
MCH: 28.9 pg (ref 26.0–34.0)
MCHC: 33.2 g/dL (ref 30.0–36.0)
MCV: 87.2 fL (ref 80.0–100.0)
Platelets: 235 10*3/uL (ref 150–400)
RBC: 5.46 MIL/uL (ref 4.22–5.81)
RDW: 14.7 % (ref 11.5–15.5)
WBC: 6.9 10*3/uL (ref 4.0–10.5)
nRBC: 0 % (ref 0.0–0.2)

## 2022-04-08 LAB — MAGNESIUM: Magnesium: 2 mg/dL (ref 1.7–2.4)

## 2022-04-08 NOTE — ED Provider Notes (Signed)
[facility] EMERGENCY DEPT Provider Note   CSN: [id number] Arrival date & time: 04/08/22  1459     History  Chief Complaint  Patient presents with   Tachycardia    Victor Medina is a 76 y.o. male.  HPI     76 year old male with history of paroxysmal atrial fibrillation with history of cardioversion on Eliquis and diltiazem, chronic diastolic congestive heart failure, asthma, aortic root dilation, hypertension who presents with concern for tachycardia.  Was sitting and suddenly developed tachycardia. Device will usually say if in afib but did not today--just ready HR up to 137.  No chest pain, no dyspnea, no nausea, vomiting, diarrhea, black or bloody stools. Does not think id dehydrated or under more stress. Had regular caffeine today.     Past Medical History:  Diagnosis Date   Asthma    Cancer ([place])    mild skin   Chronic diastolic CHF (congestive heart failure) (HCC)    Morbid obesity (HCC)    OSA (obstructive sleep apnea)    PAF (paroxysmal atrial fibrillation) ([place]) 08/14/2015   s/p TEE/DCCV to NSR;  denies palplita    RBBB    atrial fib on cardizem     Home Medications Prior to Admission medications   Medication Sig Start Date End Date Taking? Authorizing Provider  apixaban (ELIQUIS) 5 MG TABS tablet Take 1 tablet (5 mg total) by mouth 2 (two) times daily. 11/25/21   Sherran Needs, NP  Cholecalciferol (VITAMIN D3) 2000 UNITS TABS Take 4,000 Units by mouth daily.     [provider]  diltiazem (CARDIZEM CD) 120 MG 24 hr capsule TAKE 1 CAPSULE BY MOUTH 2 TIMES DAILY. 02/07/22   [place], Blondell Reveal, MD  fluticasone [facility]) 50 MCG/ACT nasal spray Place 1 spray into both nostrils daily as needed for allergies or rhinitis.    [provider]  hydrochlorothiazide (HYDRODIURIL) 25 MG tablet TAKE 1 TABLET BY MOUTH EVERY DAY 03/26/22   Sueanne Margarita, MD  loratadine (CLARITIN) 10 MG tablet Take 10 mg by mouth daily.    [provider]  metoprolol tartrate (LOPRESSOR) 25 MG tablet Take 1.5 tablets by mouth twice daily 01/06/22   Sherran Needs, NP  Multiple Vitamins-Minerals (MULTIVITAMIN ADULT) CHEW Chew 2 each by mouth daily.    [provider]  Omega-3 Fatty Acids (FISH OIL) 1200 MG CAPS Take 1,200 mg by mouth daily.    [provider]  rosuvastatin (CRESTOR) 5 MG tablet TAKE 1 TABLET (5 MG TOTAL) BY MOUTH DAILY. 03/26/22   de [place], Raymond J, MD  tamsulosin (FLOMAX) 0.4 MG CAPS capsule TAKE 1 CAPSULE BY MOUTH EVERYDAY AT BEDTIME 03/26/22   [place], Blondell Reveal, MD  trolamine salicylate (ASPERCREME) 10 % cream Apply 1 application topically as needed for muscle pain.    [provider]      Allergies    Lipitor [atorvastatin]    Review of Systems   Review of Systems  Physical Exam Updated Vital Signs BP (!) 105/57   Pulse 88   Temp 97.8 F (36.6 C) (Oral)   Resp 20   Ht '6\' 1"'$  (1.854 m)   Wt (!) 155.1 kg   SpO2 98%   BMI 45.11 kg/m  Physical Exam Vitals and nursing note reviewed.  Constitutional:      General: He is not in acute distress.    Appearance: He is well-developed. He is not diaphoretic.  HENT:     Head: Normocephalic  and atraumatic.  Eyes:     Conjunctiva/sclera: Conjunctivae normal.  Cardiovascular:     Rate and Rhythm: Regular rhythm. Tachycardia present.     Heart sounds: Normal heart sounds. No murmur heard.    No friction rub. No gallop.  Pulmonary:     Effort: Pulmonary effort is normal. No respiratory distress.     Breath sounds: Normal breath sounds. No wheezing or rales.  Abdominal:     General: There is no distension.     Palpations: Abdomen is soft.     Tenderness: There is no abdominal tenderness. There is no guarding.  Musculoskeletal:     Cervical back: Normal range of motion.  Skin:    General: Skin is warm and dry.  Neurological:     Mental Status: He is alert and oriented to person, place, and time.     ED Results /  Procedures / Treatments   Labs (all labs ordered are listed, but only abnormal results are displayed) Labs Reviewed  BASIC METABOLIC PANEL - Abnormal; Notable for the following components:      Result Value   Glucose, Bld 140 (*)    BUN 27 (*)    All other components within normal limits  URINALYSIS, ROUTINE W REFLEX MICROSCOPIC - Abnormal; Notable for the following components:   Leukocytes,Ua LARGE (*)    All other components within normal limits  SARS CORONAVIRUS 2 BY RT PCR  CBC  MAGNESIUM  TROPONIN I (HIGH SENSITIVITY)  TROPONIN I (HIGH SENSITIVITY)    EKG EKG Interpretation  Date/Time:  Thursday April 08 2022 15:07:39 EDT Ventricular Rate:  93 PR Interval:  126 QRS Duration: 147 QT Interval:  377 QTC Calculation: 469 R Axis:   -53 Text Interpretation: Sinus tachycardia Atrial premature complexes in couplets RBBB and LAFB Left ventricular hypertrophy No significant change since last tracing Confirmed by Gareth Morgan [phone]) on 04/08/2022 3:23:27 PM  Radiology DG Chest 2 View  Result Date: 04/08/2022 CLINICAL DATA:  Tachycardia EXAM: CHEST - 2 VIEW COMPARISON:  08/20/2014 FINDINGS: The heart size and mediastinal contours are within normal limits. Both lungs are clear. The visualized skeletal structures are unremarkable. IMPRESSION: No active cardiopulmonary disease. Electronically Signed   By: Donavan Foil M.D.   On: 04/08/2022 16:34    Procedures Procedures    Medications Ordered in ED Medications - No data to display  ED Course/ Medical Decision Making/ A&P                           Medical Decision Making Amount and/or Complexity of Data Reviewed Labs: ordered. Radiology: ordered.   76 year old male with history of paroxysmal atrial fibrillation with history of cardioversion on Eliquis and diltiazem, chronic diastolic congestive heart failure, asthma, aortic root dilation, hypertension who presents with concern for tachycardia.  DDx includes cardiac  arrhythmia, ACS, PE, electrolyte abnormalities.  EKG completed and personally evaluated by me shows sinus arrhythmia, no significant changes.  Labs completed and personally evaluated by me show no sign of significant anemia, normal troponin, no significant electrolyte abnormality. Do not feel UA consistent with uti. COVID negative. CXR without abnormalities.  HR improved in ED. Recommend continued monitoring.         Final Clinical Impression(s) / ED Diagnoses Final diagnoses:  Tachycardia    Rx / DC Orders ED Discharge Orders     None         Gareth Morgan, MD 04/10/22 [phone]

## 2022-04-08 NOTE — ED Triage Notes (Signed)
Patient here POV  from Home.  Patient was at Rest when the Patient began to have an elevated HR at 140-150. This Occurrence lasted approximately 30 minutes approximately 1-2 hours ago.   No Discernable Symptoms such as CP or SOB. Currently Taking Eliquis with history of Cardioversion.   NAD Noted during Triage. A&Ox4. GCS 15. Ambulatory.

## 2022-04-14 NOTE — Progress Notes (Signed)
Cardiology Clinic Note   Patient Name: Victor Medina Date of Encounter: 04/16/2022  Primary Care Provider:  [place], Blondell Reveal, MD Primary Cardiologist:  Fransico Him, MD  Patient Profile    76 year old male with a h/o OSA with CPAP, Chronic diastolic CHF, RBBB, aortic root dilatation, HTN, PAF.  CHA2DS2VASc score of at least 4. Sees Roderic Palau, NP in the Afib clinic. Last seen there on 12/24/2021. He is s/p DCCV 12/17/2021. Sees Dr. Radford Pax for OSA on CPAP.   Past Medical History    Past Medical History:  Diagnosis Date   Asthma    Cancer ([place])    mild skin   Chronic diastolic CHF (congestive heart failure) (HCC)    Morbid obesity (HCC)    OSA (obstructive sleep apnea)    PAF (paroxysmal atrial fibrillation) ([place]) 08/14/2015   s/p TEE/DCCV to NSR;  denies palplita    RBBB    atrial fib on cardizem   Past Surgical History:  Procedure Laterality Date   CARDIOVERSION N/A 05/19/2015   Procedure: CARDIOVERSION;  Surgeon: Fay Records, MD;  Location: [facility];  Service: Cardiovascular;  Laterality: N/A;   CARDIOVERSION N/A 12/17/2021   Procedure: CARDIOVERSION;  Surgeon: Fay Records, MD;  Location: [facility] ENDOSCOPY;  Service: Cardiovascular;  Laterality: N/A;   CHOLECYSTECTOMY N/A 12/01/2012   Procedure: LAPAROSCOPIC CHOLECYSTECTOMY;  Surgeon: Gwenyth Ober, MD;  Location: [facility];  Service: General;  Laterality: N/A;   COLONOSCOPY WITH PROPOFOL N/A 07/13/2019   Procedure: COLONOSCOPY WITH PROPOFOL;  Surgeon: Carol Ada, MD;  Location: [facility] ENDOSCOPY;  Service: Endoscopy;  Laterality: N/A;   MASS EXCISION Right 02/20/2018   Procedure: RIGHT THUMB EXCISION MASS AND DEBRIDEMENT DISTAL INTRPHLANGEAL JOINT;  Surgeon: Leanora Cover, MD;  Location: [facility];  Service: Orthopedics;  Laterality: Right;   POLYPECTOMY  07/13/2019   Procedure: POLYPECTOMY;  Surgeon: Carol Ada, MD;  Location: [facility] ENDOSCOPY;  Service: Endoscopy;;   right knee     meniscus removal 6 years  ago   SHOULDER SURGERY     TEE WITHOUT CARDIOVERSION N/A 05/19/2015   Procedure: TRANSESOPHAGEAL ECHOCARDIOGRAM (TEE);  Surgeon: Fay Records, MD;  Location: [facility] ENDOSCOPY;  Service: Cardiovascular;  Laterality: N/A;    Allergies  Allergies  Allergen Reactions   Lipitor [Atorvastatin] Other (See Comments)    Painful joints     History of Present Illness  [name] is a 76 year old male who is here for new prescription for CPAP.  His other machine is malfunctioning, he has to wake up during the night to adjust it.  He reports 100% compliance.  He works out 3 times a week at a gym for an hour and a half.  He does an hour on 15 different weight machines, and 1/2-hour on a cross trainer.  His weight has dropped a total of 30 pounds in total and he was 358 pounds approximately 6 months ago.  On review his weights he has dropped an additional 9 pounds since June 2023.  He is determined to lose weight so he can have knee surgery.  He denies any chest discomfort, significant shortness of breath, or profound fatigue surrounding his purposeful exercise regimen.  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  He is medically compliant.  Home Medications    Current Outpatient Medications  Medication Sig Dispense Refill   apixaban (ELIQUIS) 5 MG TABS tablet Take 1 tablet (5 mg total) by mouth  2 (two) times daily. 60 tablet 3   Cholecalciferol (VITAMIN D3) 2000 UNITS TABS Take 4,000 Units by mouth daily.      diltiazem (CARDIZEM CD) 120 MG 24 hr capsule TAKE 1 CAPSULE BY MOUTH 2 TIMES DAILY. 90 capsule 0   fluticasone (FLONASE) 50 MCG/ACT nasal spray Place 1 spray into both nostrils daily as needed for allergies or rhinitis.     hydrochlorothiazide (HYDRODIURIL) 25 MG tablet TAKE 1 TABLET BY MOUTH EVERY DAY 30 tablet 0   loratadine (CLARITIN) 10 MG tablet Take 10 mg by mouth daily.     metoprolol tartrate (LOPRESSOR) 25 MG tablet Take 1.5 tablets by  mouth twice daily 270 tablet 1   Multiple Vitamins-Minerals (MULTIVITAMIN ADULT) CHEW Chew 2 each by mouth daily.     Omega-3 Fatty Acids (FISH OIL) 1200 MG CAPS Take 1,200 mg by mouth daily.     rosuvastatin (CRESTOR) 5 MG tablet TAKE 1 TABLET (5 MG TOTAL) BY MOUTH DAILY. 90 tablet 0   tamsulosin (FLOMAX) 0.4 MG CAPS capsule TAKE 1 CAPSULE BY MOUTH EVERYDAY AT BEDTIME 90 capsule 0   trolamine salicylate (ASPERCREME) 10 % cream Apply 1 application topically as needed for muscle pain.     No current facility-administered medications for this visit.     Family History    Family History  Problem Relation Age of Onset   Hypotension Mother    Heart disease Sister    He indicated that his mother is deceased. He indicated that his father is deceased. He indicated that the status of his sister is unknown. He indicated that his maternal grandmother is deceased. He indicated that his maternal grandfather is deceased. He indicated that his paternal grandmother is deceased. He indicated that his paternal grandfather is deceased.  Social History    Social History   Socioeconomic History   Marital status: Married    Spouse name: Not on file   Number of children: Not on file   Years of education: Not on file   Highest education level: Not on file  Occupational History   Not on file  Tobacco Use   Smoking status: Former    Years: 20.00    Types: Cigarettes   Smokeless tobacco: Never  Vaping Use   Vaping Use: Never used  Substance and Sexual Activity   Alcohol use: Yes    Alcohol/week: 0.0 standard drinks of alcohol    Comment: rarely   Drug use: No   Sexual activity: Never  Other Topics Concern   Not on file  Social History Narrative   Not on file   Social Determinants of Health   Financial Resource Strain: Low Risk  (04/02/2022)   Overall Financial Resource Strain (CARDIA)    Difficulty of Paying Living Expenses: Not hard at all  Food Insecurity: No Food Insecurity (04/02/2022)    Hunger Vital Sign    Worried About Running Out of Food in the Last Year: Never true    Ran Out of Food in the Last Year: Never true  Transportation Needs: No Transportation Needs (04/02/2022)   PRAPARE - Hydrologist (Medical): No    Lack of Transportation (Non-Medical): No  Physical Activity: Sufficiently Active (04/02/2022)   Exercise Vital Sign    Days of Exercise per Week: 3 days    Minutes of Exercise per Session: 60 min  Stress: No Stress Concern Present (04/02/2022)   [place]  Feeling of Stress : Not at all  Social Connections: Moderately Isolated (04/02/2022)   Social Connection and Isolation Panel [NHANES]    Frequency of Communication with Friends and Family: Twice a week    Frequency of Social Gatherings with Friends and Family: Once a week    Attends Religious Services: Never    Marine scientist or Organizations: No    Attends Archivist Meetings: Never    Marital Status: Married  Human resources officer Violence: Not At Risk (04/02/2022)   Humiliation, Afraid, Rape, and Kick questionnaire    Fear of Current or Ex-Partner: No    Emotionally Abused: No    Physically Abused: No    Sexually Abused: No     Review of Systems    General:  No chills, fever, night sweats or weight changes.  (Purposeful weight loss with diet and exercise) Cardiovascular:  No chest pain, dyspnea on exertion, edema, orthopnea, palpitations, paroxysmal nocturnal dyspnea. Dermatological: No rash, lesions/masses Respiratory: No cough, dyspnea Urologic: No hematuria, dysuria Abdominal:   No nausea, vomiting, diarrhea, bright red blood per rectum, melena, or hematemesis Neurologic:  No visual changes, wkns, changes in mental status. All other systems reviewed and are otherwise negative except as noted above.     Physical Exam    VS:  BP 126/84   Pulse 85   Ht '6\' 1"'$  (1.854 m)   Wt (!)  333 lb (151 kg)   SpO2 94%   BMI 43.93 kg/m  , BMI Body mass index is 43.93 kg/m.     GEN: Well nourished, well developed, in no acute distress.  Obese HEENT: normal. Neck: Supple, no JVD, carotid bruits, or masses. Cardiac: RRR, no murmurs, rubs, or gallops. No clubbing, cyanosis, edema.  Radials/DP/PT 2+ and equal bilaterally.  Respiratory:  Respirations regular and unlabored, clear to auscultation bilaterally. GI: Soft, nontender, nondistended, BS + x 4. MS: no deformity or atrophy. Skin: warm and dry, no rash. Neuro:  Strength and sensation are intact. Psych: Normal affect.  Accessory Clinical Findings    ECG personally reviewed by me today-not completed today.  Lab Results  Component Value Date   WBC 6.9 04/08/2022   HGB 15.8 04/08/2022   HCT 47.6 04/08/2022   MCV 87.2 04/08/2022   PLT 235 04/08/2022   Lab Results  Component Value Date   CREATININE 1.19 04/08/2022   BUN 27 (H) 04/08/2022   NA 139 04/08/2022   K 3.7 04/08/2022   CL 102 04/08/2022   CO2 28 04/08/2022   Lab Results  Component Value Date   ALT 12 12/23/2021   AST 12 12/23/2021   ALKPHOS 56 12/23/2021   BILITOT 0.7 12/23/2021   Lab Results  Component Value Date   CHOL 125 12/23/2021   HDL 46 12/23/2021   LDLCALC 68 12/23/2021   TRIG 48 12/23/2021   CHOLHDL 2.7 12/23/2021    Lab Results  Component Value Date   HGBA1C 6.3 (H) 12/23/2021    Review of Prior Studies: Echocardiogram 02/05/2021 . Aortic root dimension within normal limits when indexed to BSA for  age, 39 mm.   2. Left ventricular ejection fraction, by estimation, is 50 to 55%. The  left ventricle has low normal function. The left ventricle has no regional  wall motion abnormalities. There is mild left ventricular hypertrophy.  Left ventricular diastolic  parameters are consistent with Grade I diastolic dysfunction (impaired  relaxation).   3. Right ventricular systolic function is normal. The right ventricular  size is  mildly enlarged.   4. The mitral valve is normal in structure. No evidence of mitral valve  regurgitation. No evidence of mitral stenosis.   5. The aortic valve is grossly normal. Aortic valve regurgitation is not  visualized. No aortic stenosis is present.   Assessment & Plan   1.  OSA: Needs new prescription for CPAP due to malfunctioning machine which she is using currently.  He awakens during the night to adjust it in order for it to continue to work.  He reports 100% compliance stating he is unable to sleep without it.  Review of compliance report beginning 12/21/2020 to present, reveals 30/30 usage days, 100% compliance.  2.  Chronic diastolic heart failure: Dry weight will have to be recalculated as he continues to lose weight with diet and exercise dropping over 35 pounds.  There is no evidence of volume overload on exam.  Continue current medication regimen with labs and follow-up.  3.  Hypertension: Excellent control of blood pressure on diltiazem 120 mg daily, hydrochlorothiazide 25 mg daily, and metoprolol.  No changes to his regimen.  4.  PAF: On evaluation only occasional extrasystole.  Rhythm is regular to auscultation.  Heart rate is well controlled on metoprolol and diltiazem.  Remains on Eliquis 5 mg twice daily without sequela of bleeding, melena, hemoptysis, epistaxis.   5.  Hypercholesterolemia: Remains on low-dose rosuvastatin 5 mg daily.  Most recent LDL dated 12/23/2021 was 68, total cholesterol 125, HDL 46, triglycerides 48.  No changes in regimen as he is well controlled.  Current medicines are reviewed at length with the patient today.  I have spent 45 min's  dedicated to the care of this patient on the date of this encounter to include pre-visit review of records, assessment, management and diagnostic testing,with shared decision making.   Signed, Phill Myron. West Pugh, ANP, [place]   04/16/2022 12:28 PM      Office [phone] Fax [phone]  Notice: This  dictation was prepared with Dragon dictation along with smaller phrase technology. Any transcriptional errors that result from this process are unintentional and may not be corrected upon review.

## 2022-04-16 ENCOUNTER — Ambulatory Visit: Payer: Medicare HMO | Attending: Adult Health | Admitting: Adult Health

## 2022-04-16 ENCOUNTER — Encounter: Payer: Self-pay | Admitting: Adult Health

## 2022-04-16 VITALS — BP 126/84 | HR 85 | Ht 73.0 in | Wt 333.0 lb

## 2022-04-16 DIAGNOSIS — E7800 Pure hypercholesterolemia, unspecified: Secondary | ICD-10-CM | POA: Diagnosis not present

## 2022-04-16 DIAGNOSIS — G4733 Obstructive sleep apnea (adult) (pediatric): Secondary | ICD-10-CM | POA: Diagnosis not present

## 2022-04-16 DIAGNOSIS — I10 Essential (primary) hypertension: Secondary | ICD-10-CM | POA: Diagnosis not present

## 2022-04-16 DIAGNOSIS — I5032 Chronic diastolic (congestive) heart failure: Secondary | ICD-10-CM | POA: Diagnosis not present

## 2022-04-16 DIAGNOSIS — I480 Paroxysmal atrial fibrillation: Secondary | ICD-10-CM | POA: Diagnosis not present

## 2022-04-16 NOTE — Patient Instructions (Signed)
Medication Instructions:  No changes *If you need a refill on your cardiac medications before your next appointment, please call your pharmacy*   Lab Work: No Labs If you have labs (blood work) drawn today and your tests are completely normal, you will receive your results only by: [place] (if you have MyChart) OR A paper copy in the mail If you have any lab test that is abnormal or we need to change your treatment, we will call you to review the results.   Testing/Procedures: No Testing   Follow-Up: At [facility], you and your health needs are our priority.  As part of our continuing mission to provide you with exceptional heart care, we have created designated Provider Care Teams.  These Care Teams include your primary Cardiologist (physician) and Advanced Practice Providers (APPs -  Physician Assistants and Nurse Practitioners) who all work together to provide you with the care you need, when you need it.  We recommend signing up for the patient portal called "MyChart".  Sign up information is provided on this After Visit Summary.  MyChart is used to connect with patients for Virtual Visits (Telemedicine).  Patients are able to view lab/test results, encounter notes, upcoming appointments, etc.  Non-urgent messages can be sent to your provider as well.   To learn more about what you can do with MyChart, go to [email or web address].    Your next appointment:   1 year(s)  The format for your next appointment:   In Person  Provider:   Fransico Him, MD

## 2022-04-21 ENCOUNTER — Encounter (HOSPITAL_BASED_OUTPATIENT_CLINIC_OR_DEPARTMENT_OTHER): Payer: Self-pay | Admitting: Family Medicine

## 2022-04-21 ENCOUNTER — Ambulatory Visit (INDEPENDENT_AMBULATORY_CARE_PROVIDER_SITE_OTHER): Payer: Medicare HMO | Admitting: Family Medicine

## 2022-04-21 VITALS — BP 119/105 | HR 80 | Ht 73.0 in | Wt 332.4 lb

## 2022-04-21 DIAGNOSIS — I480 Paroxysmal atrial fibrillation: Secondary | ICD-10-CM | POA: Diagnosis not present

## 2022-04-21 NOTE — Assessment & Plan Note (Signed)
Patient reports that he has been feeling "off" over the past few days.  Initially felt as though he may have had a start of being an illness developing, however does not feel that this is the case.  Reports having some occasional dizziness, most notable when getting out of bed such as when he would get up at night to use the bathroom.  He has had some readings of elevated heart rate/atrial fibrillation on his wearable devices.  He does continue with diltiazem, metoprolol for rate control and also continues with anticoagulation using Eliquis.  He did have appointment with cardiology at the end of last week, was doing well at that time no medication changes were noted at that appointment.  As a result of lifestyle modifications, he has been having weight loss over the past several months.  With recent symptoms, he has been checking his blood pressure at home and reports that readings have been normal On exam today, patient is in no acute distress, vital signs stable, normal rate.  Cardiovascular exam with irregularly irregular rhythm normal rate. Discussed potential etiology of patient's symptoms, could possibly be related to atrial fibrillation.  EKG completed in the office today which showed atrial fibrillation with RVR. Given findings from EKG and current symptoms, would recommend evaluation with cardiology to further review symptoms and need for any further intervention/medication changes

## 2022-04-21 NOTE — Patient Instructions (Signed)
  Medication Instructions:  Your physician recommends that you continue on your current medications as directed. Please refer to the Current Medication list given to you today. --If you need a refill on any your medications before your next appointment, please call your pharmacy first. If no refills are authorized on file call the office.-- Lab Work: Your physician has recommended that you have lab work today: No If you have labs (blood work) drawn today and your tests are completely normal, you will receive your results via [place] a phone call from our staff.  Please ensure you check your voicemail in the event that you authorized detailed messages to be left on a delegated number. If you have any lab test that is abnormal or we need to change your treatment, we will call you to review the results.  Referrals/Procedures/Imaging: No  Follow-Up: Your next appointment:   Your physician recommends that you schedule a follow-up appointment in: 3 months follow-up with Dr. de [place]  You will receive a text message or e-mail with a link to a survey about your care and experience with [place] today! We would greatly appreciate your feedback!   Thanks for letting us be apart of your health journey!!  Primary Care and Sports Medicine   Dr. Arlina Robes [place]   We encourage you to activate your patient portal called "MyChart".  Sign up information is provided on this After Visit Summary.  MyChart is used to connect with patients for Virtual Visits (Telemedicine).  Patients are able to view lab/test results, encounter notes, upcoming appointments, etc.  Non-urgent messages can be sent to your provider as well. To learn more about what you can do with MyChart, please visit --  [email or web address].

## 2022-04-21 NOTE — Progress Notes (Signed)
    Procedures performed today:    EKG: Findings of atrial fibrillation with RVR, rate of 127 at time of EKG.  Independent interpretation of notes and tests performed by another provider:   None.  Brief History, Exam, Impression, and Recommendations:    BP (!) 119/105   Pulse 80   Ht '6\' 1"'$  (1.854 m)   Wt (!) 332 lb 6.4 oz (150.8 kg)   SpO2 99%   BMI 43.85 kg/m   PAF (paroxysmal atrial fibrillation) (HCC) Patient reports that he has been feeling "off" over the past few days.  Initially felt as though he may have had a start of being an illness developing, however does not feel that this is the case.  Reports having some occasional dizziness, most notable when getting out of bed such as when he would get up at night to use the bathroom.  He has had some readings of elevated heart rate/atrial fibrillation on his wearable devices.  He does continue with diltiazem, metoprolol for rate control and also continues with anticoagulation using Eliquis.  He did have appointment with cardiology at the end of last week, was doing well at that time no medication changes were noted at that appointment.  As a result of lifestyle modifications, he has been having weight loss over the past several months.  With recent symptoms, he has been checking his blood pressure at home and reports that readings have been normal On exam today, patient is in no acute distress, vital signs stable, normal rate.  Cardiovascular exam with irregularly irregular rhythm normal rate. Discussed potential etiology of patient's symptoms, could possibly be related to atrial fibrillation.  EKG completed in the office today which showed atrial fibrillation with RVR. Given findings from EKG and current symptoms, would recommend evaluation with cardiology to further review symptoms and need for any further intervention/medication changes  Return in about 3 months (around  07/21/2022).   ___________________________________________ Kandi Brusseau [place], MD, ABFM, CAQSM Primary Care and [place]

## 2022-04-22 ENCOUNTER — Telehealth: Payer: Self-pay | Admitting: Cardiology

## 2022-04-22 ENCOUNTER — Encounter (HOSPITAL_BASED_OUTPATIENT_CLINIC_OR_DEPARTMENT_OTHER): Payer: Self-pay | Admitting: Cardiology

## 2022-04-22 ENCOUNTER — Encounter (HOSPITAL_BASED_OUTPATIENT_CLINIC_OR_DEPARTMENT_OTHER): Payer: Self-pay | Admitting: Family Medicine

## 2022-04-22 ENCOUNTER — Encounter: Payer: Self-pay | Admitting: Adult Health

## 2022-04-22 ENCOUNTER — Ambulatory Visit (HOSPITAL_COMMUNITY)
Admission: RE | Admit: 2022-04-22 | Discharge: 2022-04-22 | Disposition: A | Discharge status: Home / Self Care | Payer: Medicare HMO | Source: Ambulatory Visit | Attending: Nurse Practitioner | Admitting: Nurse Practitioner

## 2022-04-22 VITALS — BP 120/66 | HR 133 | Ht 73.0 in | Wt 333.6 lb

## 2022-04-22 DIAGNOSIS — D6869 Other thrombophilia: Secondary | ICD-10-CM | POA: Diagnosis not present

## 2022-04-22 DIAGNOSIS — I4819 Other persistent atrial fibrillation: Secondary | ICD-10-CM

## 2022-04-22 DIAGNOSIS — I5032 Chronic diastolic (congestive) heart failure: Secondary | ICD-10-CM | POA: Insufficient documentation

## 2022-04-22 DIAGNOSIS — I480 Paroxysmal atrial fibrillation: Secondary | ICD-10-CM | POA: Insufficient documentation

## 2022-04-22 MED ORDER — METOPROLOL TARTRATE 25 MG PO TABS: ORAL_TABLET | ORAL | Status: DC

## 2022-04-22 NOTE — Telephone Encounter (Signed)
Information obtained via patient schedule message: "I had atrial fibrillations  last night start at 2 am on thru to almost 6 am"   Patient c/o Palpitations:  High priority if patient c/o lightheadedness, shortness of breath, or chest pain  How long have you had palpitations/irregular HR/ Afib? Are you having the symptoms now?  Afib started 9/12 @ 7:20 PM "then 9/13 @ 2:22pm then 9/13 @ 10:47 pm then 9/14 '@01'$ :40am to 05:00 am"  Are you currently experiencing lightheadedness, SOB or CP?   Do you have a history of afib (atrial fibrillation) or irregular heart rhythm?   Have you checked your BP or HR? (document readings if available):  (Received from pt at 9:56 AM) HR just sitting has gone up and down from 62 to 89 Current BP 138/82  Are you experiencing any other symptoms?

## 2022-04-22 NOTE — Patient Instructions (Signed)
Increase Metoprolol- Taking two tablets by mouth twice daily

## 2022-04-22 NOTE — Telephone Encounter (Signed)
Error

## 2022-04-22 NOTE — Telephone Encounter (Signed)
Called pt in regards to Afib.  Reports is scheduled to see Roderic Palau today at 3 pm.  Has readings to show provider at visit. No urgent needs at this time. Takes Eliquis as ordered.  Reports did not know diltiazem was ordered BID was taking QD until about 2 weeks ago.  Wears CPAP qhs for the last 13 years will need new machine soon. No further concerns at this time.

## 2022-04-22 NOTE — Progress Notes (Signed)
Primary Care Physician: [place], Blondell Reveal, MD Referring Physician: Dr. Joanette Gula is a 76 y.o. male with a h/o paroxysmal atrial fibrillation with history of cardioversion on Eliquis and diltiazem, chronic diastolic congestive heart failure, asthma, aortic root dilation, hypertension who presents with concern for tachycardia.   Was sitting and suddenly developed tachycardia. Device will usually say if in afib but did not today--just ready HR up to 137. No chest pain, no dyspnea, no nausea, vomiting, diarrhea, black or bloody stools. Does not think id dehydrated or under more stress. Had regular caffeine today.    Pt is in the afib clinic, 04/23/22 for recent return to afib. His ekg shows afib at 132 bpm. He states he was doing well since May, staying in [place] until recently. He has been going to the gym and lost some weight but has reached a plateau.  His cpap has reached end of life and he is ordering another one.   Today, he denies symptoms of palpitations, chest pain, shortness of breath, orthopnea, PND, lower extremity edema, dizziness, presyncope, syncope, or neurologic sequela. The patient is tolerating medications without difficulties and is otherwise without complaint today.   Past Medical History:  Diagnosis Date   Asthma    Cancer ([place])    mild skin   Chronic diastolic CHF (congestive heart failure) (HCC)    Morbid obesity (HCC)    OSA (obstructive sleep apnea)    PAF (paroxysmal atrial fibrillation) ([place]) 08/14/2015   s/p TEE/DCCV to NSR;  denies palplita    RBBB    atrial fib on cardizem   Past Surgical History:  Procedure Laterality Date   CARDIOVERSION N/A 05/19/2015   Procedure: CARDIOVERSION;  Surgeon: Fay Records, MD;  Location: [place];  Service: Cardiovascular;  Laterality: N/A;   CARDIOVERSION N/A 12/17/2021   Procedure: CARDIOVERSION;  Surgeon: Fay Records, MD;  Location: [facility] ENDOSCOPY;  Service: Cardiovascular;  Laterality: N/A;    CHOLECYSTECTOMY N/A 12/01/2012   Procedure: LAPAROSCOPIC CHOLECYSTECTOMY;  Surgeon: Gwenyth Ober, MD;  Location: [place];  Service: General;  Laterality: N/A;   COLONOSCOPY WITH PROPOFOL N/A 07/13/2019   Procedure: COLONOSCOPY WITH PROPOFOL;  Surgeon: Carol Ada, MD;  Location: [facility] ENDOSCOPY;  Service: Endoscopy;  Laterality: N/A;   MASS EXCISION Right 02/20/2018   Procedure: RIGHT THUMB EXCISION MASS AND DEBRIDEMENT DISTAL INTRPHLANGEAL JOINT;  Surgeon: Leanora Cover, MD;  Location: [place];  Service: Orthopedics;  Laterality: Right;   POLYPECTOMY  07/13/2019   Procedure: POLYPECTOMY;  Surgeon: Carol Ada, MD;  Location: [facility] ENDOSCOPY;  Service: Endoscopy;;   right knee     meniscus removal 6 years ago   SHOULDER SURGERY     TEE WITHOUT CARDIOVERSION N/A 05/19/2015   Procedure: TRANSESOPHAGEAL ECHOCARDIOGRAM (TEE);  Surgeon: Fay Records, MD;  Location: [facility] ENDOSCOPY;  Service: Cardiovascular;  Laterality: N/A;    Current Outpatient Medications  Medication Sig Dispense Refill   apixaban (ELIQUIS) 5 MG TABS tablet Take 1 tablet (5 mg total) by mouth 2 (two) times daily. 60 tablet 3   Cholecalciferol (VITAMIN D3) 2000 UNITS TABS Take 4,000 Units by mouth daily.      diltiazem (CARDIZEM CD) 120 MG 24 hr capsule TAKE 1 CAPSULE BY MOUTH 2 TIMES DAILY. 90 capsule 0   fluticasone (FLONASE) 50 MCG/ACT nasal spray Place 1 spray into both nostrils daily as needed for allergies or rhinitis.     hydrochlorothiazide (HYDRODIURIL) 25 MG tablet TAKE 1  TABLET BY MOUTH EVERY DAY 30 tablet 0   loratadine (CLARITIN) 10 MG tablet Take 10 mg by mouth daily.     metoprolol tartrate (LOPRESSOR) 25 MG tablet Take 1.5 tablets by mouth twice daily 270 tablet 1   Multiple Vitamins-Minerals (MULTIVITAMIN ADULT) CHEW Chew 2 each by mouth daily.     Omega-3 Fatty Acids (FISH OIL) 1200 MG CAPS Take 1,200 mg by mouth daily.     rosuvastatin (CRESTOR) 5 MG tablet TAKE 1 TABLET (5 MG TOTAL) BY MOUTH DAILY.  90 tablet 0   tamsulosin (FLOMAX) 0.4 MG CAPS capsule TAKE 1 CAPSULE BY MOUTH EVERYDAY AT BEDTIME 90 capsule 0   trolamine salicylate (ASPERCREME) 10 % cream Apply 1 application topically as needed for muscle pain.     No current facility-administered medications for this encounter.    Allergies  Allergen Reactions   Lipitor [Atorvastatin] Other (See Comments)    Painful joints     Social History   Socioeconomic History   Marital status: Married    Spouse name: Not on file   Number of children: Not on file   Years of education: Not on file   Highest education level: Not on file  Occupational History   Not on file  Tobacco Use   Smoking status: Former    Years: 20.00    Types: Cigarettes   Smokeless tobacco: Never  Vaping Use   Vaping Use: Never used  Substance and Sexual Activity   Alcohol use: Yes    Alcohol/week: 0.0 standard drinks of alcohol    Comment: rarely   Drug use: No   Sexual activity: Never  Other Topics Concern   Not on file  Social History Narrative   Not on file   Social Determinants of Health   Financial Resource Strain: Low Risk  (04/02/2022)   Overall Financial Resource Strain (CARDIA)    Difficulty of Paying Living Expenses: Not hard at all  Food Insecurity: No Food Insecurity (04/02/2022)   Hunger Vital Sign    Worried About Running Out of Food in the Last Year: Never true    Ran Out of Food in the Last Year: Never true  Transportation Needs: No Transportation Needs (04/02/2022)   PRAPARE - Hydrologist (Medical): No    Lack of Transportation (Non-Medical): No  Physical Activity: Sufficiently Active (04/02/2022)   Exercise Vital Sign    Days of Exercise per Week: 3 days    Minutes of Exercise per Session: 60 min  Stress: No Stress Concern Present (04/02/2022)   [place]    Feeling of Stress : Not at all  Social Connections: Moderately Isolated  (04/02/2022)   Social Connection and Isolation Panel [NHANES]    Frequency of Communication with Friends and Family: Twice a week    Frequency of Social Gatherings with Friends and Family: Once a week    Attends Religious Services: Never    Marine scientist or Organizations: No    Attends Archivist Meetings: Never    Marital Status: Married  Human resources officer Violence: Not At Risk (04/02/2022)   Humiliation, Afraid, Rape, and Kick questionnaire    Fear of Current or Ex-Partner: No    Emotionally Abused: No    Physically Abused: No    Sexually Abused: No    Family History  Problem Relation Age of Onset   Hypotension Mother    Heart disease Sister  ROS- All systems are reviewed and negative except as per the HPI above  Physical Exam: There were no vitals filed for this visit. Wt Readings from Last 3 Encounters:  04/21/22 (!) 150.8 kg  04/16/22 (!) 151 kg  04/08/22 (!) 155.1 kg    Labs: Lab Results  Component Value Date   NA 139 04/08/2022   K 3.7 04/08/2022   CL 102 04/08/2022   CO2 28 04/08/2022   GLUCOSE 140 (H) 04/08/2022   BUN 27 (H) 04/08/2022   CREATININE 1.19 04/08/2022   CALCIUM 9.8 04/08/2022   PHOS 3.4 05/16/2015   MG 2.0 04/08/2022   No results found for: "INR" Lab Results  Component Value Date   CHOL 125 12/23/2021   HDL 46 12/23/2021   LDLCALC 68 12/23/2021   TRIG 48 12/23/2021     GEN- The patient is well appearing, alert and oriented x 3 today.   Head- normocephalic, atraumatic Eyes-  Sclera clear, conjunctiva pink Ears- hearing intact Oropharynx- clear Neck- supple, no JVP Lymph- no cervical lymphadenopathy Lungs- Clear to ausculation bilaterally, normal work of breathing Heart- Regular rate and rhythm, no murmurs, rubs or gallops, PMI not laterally displaced GI- soft, NT, ND, + BS Extremities- no clubbing, cyanosis, or edema MS- no significant deformity or atrophy Skin- no rash or lesion Psych- euthymic mood, full  affect Neuro- strength and sensation are intact  EKG-  Vent. rate 132 BPM PR interval * ms QRS duration 142 ms QT/QTcB 304/450 ms P-R-T axes * -45 95 Atrial fibrillation with rapid ventricular response Right bundle branch block Left anterior fascicular block  Bifascicular block  Minimal voltage criteria for LVH, may be normal variant ( R in aVL ) T wave abnormality, consider lateral ischemia Abnormal ECG When compared with ECG of 21-Apr-2022 11:26, PREVIOUS ECG IS PRESENT    Assessment and Plan:  1. Persistent  afib  We discussed how to proceed to restore SR. He really wants to have his cataract surgery toward the end of the month and may not be possible to return to SR at that point but can try to slow him down by increasing rate control so he would be able to proceed to surgery.  I discussed cardioversion alone or adding AAD, amiodarone or Tikosyn would be his best options. His weight makes him a less optimal pt for ablation.  For now increase metoprolol to 2 tabs bid and I will see back on Tuesday Reminded not to miss any  eliquis 5 mg bid  with pending cardioversion   Butch Penny C. Orit [place], [facility] [address] [place], [place] [address] [phone]

## 2022-04-25 ENCOUNTER — Other Ambulatory Visit: Payer: Self-pay

## 2022-04-25 ENCOUNTER — Emergency Department (HOSPITAL_BASED_OUTPATIENT_CLINIC_OR_DEPARTMENT_OTHER)
Admission: EM | Admit: 2022-04-25 | Discharge: 2022-04-25 | Disposition: A | Discharge status: Home / Self Care | Payer: Medicare HMO | Attending: Emergency Medicine | Admitting: Emergency Medicine

## 2022-04-25 ENCOUNTER — Emergency Department (HOSPITAL_BASED_OUTPATIENT_CLINIC_OR_DEPARTMENT_OTHER): Payer: Medicare HMO

## 2022-04-25 DIAGNOSIS — I4891 Unspecified atrial fibrillation: Secondary | ICD-10-CM | POA: Diagnosis not present

## 2022-04-25 DIAGNOSIS — Z7901 Long term (current) use of anticoagulants: Secondary | ICD-10-CM | POA: Insufficient documentation

## 2022-04-25 DIAGNOSIS — Z20822 Contact with and (suspected) exposure to covid-19: Secondary | ICD-10-CM | POA: Diagnosis not present

## 2022-04-25 DIAGNOSIS — I5032 Chronic diastolic (congestive) heart failure: Secondary | ICD-10-CM | POA: Insufficient documentation

## 2022-04-25 DIAGNOSIS — R0602 Shortness of breath: Secondary | ICD-10-CM | POA: Diagnosis not present

## 2022-04-25 LAB — RESP PANEL BY RT-PCR (FLU A&B, COVID) ARPGX2
Influenza A by PCR: NEGATIVE
Influenza B by PCR: NEGATIVE
SARS Coronavirus 2 by RT PCR: NEGATIVE

## 2022-04-25 LAB — BASIC METABOLIC PANEL
Anion gap: 11 (ref 5–15)
BUN: 32 mg/dL — ABNORMAL HIGH (ref 8–23)
CO2: 26 mmol/L (ref 22–32)
Calcium: 9.4 mg/dL (ref 8.9–10.3)
Chloride: 105 mmol/L (ref 98–111)
Creatinine, Ser: 1.23 mg/dL (ref 0.61–1.24)
GFR, Estimated: >60 mL/min (ref >60)
Glucose, Bld: 122 mg/dL — ABNORMAL HIGH (ref 70–99)
Potassium: 3.7 mmol/L (ref 3.5–5.1)
Sodium: 142 mmol/L (ref 135–145)

## 2022-04-25 LAB — TSH: TSH: 4.363 u[IU]/mL (ref 0.350–4.500)

## 2022-04-25 LAB — CBC
HCT: 46.4 % (ref 39.0–52.0)
Hemoglobin: 15.5 g/dL (ref 13.0–17.0)
MCH: 29.9 pg (ref 26.0–34.0)
MCHC: 33.4 g/dL (ref 30.0–36.0)
MCV: 89.4 fL (ref 80.0–100.0)
Platelets: 245 10*3/uL (ref 150–400)
RBC: 5.19 MIL/uL (ref 4.22–5.81)
RDW: 14.5 % (ref 11.5–15.5)
WBC: 6.7 10*3/uL (ref 4.0–10.5)
nRBC: 0 % (ref 0.0–0.2)

## 2022-04-25 LAB — T4, FREE: Free T4: 1 ng/dL (ref 0.61–1.12)

## 2022-04-25 LAB — TROPONIN I (HIGH SENSITIVITY)
Troponin I (High Sensitivity): 7 ng/L (ref <18)
Troponin I (High Sensitivity): 7 ng/L (ref <18)

## 2022-04-25 LAB — PROTIME-INR
INR: 1.3 — ABNORMAL HIGH (ref 0.8–1.2)
Prothrombin Time: 15.9 seconds — ABNORMAL HIGH (ref 11.4–15.2)

## 2022-04-25 LAB — MAGNESIUM: Magnesium: 2.1 mg/dL (ref 1.7–2.4)

## 2022-04-25 MED ORDER — ONDANSETRON HCL 4 MG/2ML IJ SOLN
4.0000 mg | Freq: Once | INTRAMUSCULAR | Status: AC
  Administered 2022-04-25: 4 mg via INTRAVENOUS
  Filled 2022-04-25: qty 2

## 2022-04-25 MED ORDER — FENTANYL CITRATE (PF) 100 MCG/2ML IJ SOLN
INTRAMUSCULAR | Status: AC | PRN
  Administered 2022-04-25: 25 ug via INTRAVENOUS

## 2022-04-25 MED ORDER — FENTANYL CITRATE PF 50 MCG/ML IJ SOSY
25.0000 ug | PREFILLED_SYRINGE | Freq: Once | INTRAMUSCULAR | Status: DC
  Filled 2022-04-25: qty 1

## 2022-04-25 MED ORDER — DILTIAZEM HCL 25 MG/5ML IV SOLN
10.0000 mg | Freq: Once | INTRAVENOUS | Status: AC
  Administered 2022-04-25: 10 mg via INTRAVENOUS
  Filled 2022-04-25: qty 5

## 2022-04-25 MED ORDER — EPINEPHRINE 1 MG/10ML IJ SOSY: PREFILLED_SYRINGE | INTRAMUSCULAR | Status: AC | PRN

## 2022-04-25 MED ORDER — ETOMIDATE 2 MG/ML IV SOLN
12.0000 mg | Freq: Once | INTRAVENOUS | Status: AC
  Administered 2022-04-25: 12 mg via INTRAVENOUS
  Filled 2022-04-25: qty 10

## 2022-04-25 NOTE — ED Triage Notes (Signed)
Pt presents to triage with c/o being in A fib overnight, progressively getting worse for 2 days. Pt reports cold like symptoms and constantly clearing throat.

## 2022-04-25 NOTE — ED Notes (Signed)
Unable to rate pain during the procedure due to sedation 

## 2022-04-25 NOTE — ED Notes (Signed)
He tolerated the cardioversion very well. He is awake visiting with his wife. Monitor shows nsr. Repeat EKG performed and shown to Dr. Armandina Gemma.

## 2022-04-25 NOTE — ED Provider Notes (Signed)
[facility] EMERGENCY DEPT Provider Note   CSN: [id number] Arrival date & time: 04/25/22  9518     History  Chief Complaint  Patient presents with   Shortness of Breath    Pt reports Afib with Shortness of Breath    Victor Medina is a 76 y.o. male.   Shortness of Breath    76 year old male with medical history significant for atrial fibrillation on Eliquis, OSA, morbid obesity, chronic diastolic CHF (last echocardiogram revealed an ejection fraction of 50 to 55%) who presents to the emergency department with uncontrolled atrial fibrillation.  The patient states that he has had cold-like symptoms, some nasal congestion, mild productive cough, shortness of breath for the past 2 days.  He has noted that he has had uncontrolled A-fib with generalized weakness for the past 2 days.  He states that he has been compliant with his Eliquis for the past 3 days in a row.  He denies any fevers or chills.  He denies any chest pain.  He states that he is persistently experiencing mild shortness of breath.  He endorses mild bilateral lower extremity edema.  Home Medications Prior to Admission medications   Medication Sig Start Date End Date Taking? Authorizing Provider  apixaban (ELIQUIS) 5 MG TABS tablet Take 1 tablet (5 mg total) by mouth 2 (two) times daily. 11/25/21   Sherran Needs, NP  Cholecalciferol (VITAMIN D3) 2000 UNITS TABS Take 4,000 Units by mouth daily.     [provider]  diltiazem (CARDIZEM CD) 120 MG 24 hr capsule TAKE 1 CAPSULE BY MOUTH 2 TIMES DAILY. 02/07/22   [place], Blondell Reveal, MD  fluticasone [facility]) 50 MCG/ACT nasal spray Place 1 spray into both nostrils daily as needed for allergies or rhinitis.    [provider]  hydrochlorothiazide (HYDRODIURIL) 25 MG tablet TAKE 1 TABLET BY MOUTH EVERY DAY 03/26/22   Sueanne Margarita, MD  loratadine (CLARITIN) 10 MG tablet Take 10 mg by mouth daily.    [provider]  metoprolol tartrate  (LOPRESSOR) 25 MG tablet Take two tablets by mouth twice daily 04/22/22   Sherran Needs, NP  Multiple Vitamins-Minerals (MULTIVITAMIN ADULT) CHEW Chew 2 each by mouth daily.    [provider]  Omega-3 Fatty Acids (FISH OIL) 1200 MG CAPS Take 1,200 mg by mouth daily.    [provider]  rosuvastatin (CRESTOR) 5 MG tablet TAKE 1 TABLET (5 MG TOTAL) BY MOUTH DAILY. 03/26/22   [place], Raymond J, MD  tamsulosin (FLOMAX) 0.4 MG CAPS capsule TAKE 1 CAPSULE BY MOUTH EVERYDAY AT BEDTIME 03/26/22   [place], Blondell Reveal, MD  trolamine salicylate (ASPERCREME) 10 % cream Apply 1 application topically as needed for muscle pain.    [provider]      Allergies    Lipitor [atorvastatin]    Review of Systems   Review of Systems  Respiratory:  Positive for shortness of breath.   All other systems reviewed and are negative.   Physical Exam Updated Vital Signs BP 119/78 (BP Location: Right Arm)   Pulse 67   Temp 98.1 F (36.7 C) (Oral)   Resp 16   Ht '6\' 1"'$  (1.854 m)   Wt (!) 148.8 kg   SpO2 100%   BMI 43.27 kg/m  Physical Exam Vitals and nursing note reviewed.  Constitutional:      General: He is not in acute distress.    Appearance: He is well-developed.  HENT:  Head: Normocephalic and atraumatic.  Eyes:     Conjunctiva/sclera: Conjunctivae normal.  Cardiovascular:     Rate and Rhythm: Tachycardia present. Rhythm irregular.     Heart sounds: No murmur heard. Pulmonary:     Effort: Pulmonary effort is normal. Tachypnea present. No respiratory distress.     Breath sounds: Normal breath sounds.  Abdominal:     Palpations: Abdomen is soft.     Tenderness: There is no abdominal tenderness.  Musculoskeletal:        General: No swelling.     Cervical back: Neck supple.     Right lower leg: Edema present.     Left lower leg: Edema present.     Comments: Trace pitting edema bilaterally  Skin:    General: Skin is warm and dry.     Capillary Refill: Capillary  refill takes less than 2 seconds.  Neurological:     Mental Status: He is alert.  Psychiatric:        Mood and Affect: Mood normal.     ED Results / Procedures / Treatments   Labs (all labs ordered are listed, but only abnormal results are displayed) Labs Reviewed  BASIC METABOLIC PANEL - Abnormal; Notable for the following components:      Result Value   Glucose, Bld 122 (*)    BUN 32 (*)    All other components within normal limits  PROTIME-INR - Abnormal; Notable for the following components:   Prothrombin Time 15.9 (*)    INR 1.3 (*)    All other components within normal limits  RESP PANEL BY RT-PCR (FLU A&B, COVID) ARPGX2  CBC  TSH  MAGNESIUM  T4, FREE  TROPONIN I (HIGH SENSITIVITY)  TROPONIN I (HIGH SENSITIVITY)    EKG EKG Interpretation  Date/Time:  Sunday April 25 2022 12:21:11 EDT Ventricular Rate:  79 PR Interval:  139 QRS Duration: 156 QT Interval:  433 QTC Calculation: 497 R Axis:   -48 Text Interpretation: Sinus rhythm RBBB and LAFB Artifact present Confirmed by Regan Lemming ([id number]) on 04/25/2022 12:44:51 PM  Radiology DG Chest Port 1 View  Result Date: 04/25/2022 CLINICAL DATA:  Atrial fibrillation with worsening shortness of breath. EXAM: PORTABLE CHEST 1 VIEW COMPARISON:  04/08/2022 FINDINGS: Heart size is normal. No pleural effusion or edema. No airspace consolidation identified. Visualized osseous structures is unremarkable. IMPRESSION: No acute cardiopulmonary abnormalities. Electronically Signed   By: Kerby Moors M.D.   On: 04/25/2022 09:47    Procedures .Cardioversion  Date/Time: 04/25/2022 12:26 PM  Performed by: Regan Lemming, MD Authorized by: Regan Lemming, MD   Consent:    Consent obtained:  Written   Consent given by:  Patient   Risks discussed:  Cutaneous burn, death, induced arrhythmia and pain   Alternatives discussed:  Delayed treatment and observation Pre-procedure details:    Cardioversion basis:  Emergent   Rhythm:   Atrial fibrillation   Electrode placement:  Anterior-posterior Patient sedated: Yes. Refer to sedation procedure documentation for details of sedation.  Attempt one:    Cardioversion mode:  Synchronous   Waveform:  Biphasic   Shock (Joules):  120   Shock outcome:  Conversion to normal sinus rhythm Post-procedure details:    Patient status:  Alert   Patient tolerance of procedure:  Tolerated well, no immediate complications .Sedation  Date/Time: 04/25/2022 12:26 PM  Performed by: Regan Lemming, MD Authorized by: Regan Lemming, MD   Consent:    Consent obtained:  Written and verbal   Consent given  by:  Patient   Risks discussed:  Allergic reaction, dysrhythmia, prolonged hypoxia resulting in organ damage, prolonged sedation necessitating reversal, inadequate sedation, nausea, vomiting and respiratory compromise necessitating ventilatory assistance and intubation Universal protocol:    Immediately prior to procedure, a time out was called: yes     Patient identity confirmed:  Arm band and verbally with patient Indications:    Procedure performed:  Cardioversion Pre-sedation assessment:    Time since last food or drink:  4 hrs   ASA classification: class 2 - patient with mild systemic disease     Mouth opening:  3 or more finger widths   Mallampati score:  II - soft palate, uvula, fauces visible   Neck mobility: normal     Pre-sedation assessments completed and reviewed: airway patency, cardiovascular function, hydration status, mental status, nausea/vomiting, pain level and respiratory function     Pre-sedation assessment completed:  04/25/2022 12:05 PM Immediate pre-procedure details:    Reassessment: Patient reassessed immediately prior to procedure     Reviewed: vital signs, relevant labs/tests and NPO status     Verified: bag valve mask available, emergency equipment available, intubation equipment available, IV patency confirmed, oxygen available, reversal medications  available and suction available   Procedure details (see MAR for exact dosages):    Preoxygenation:  Nasal cannula   Sedation:  Etomidate   Intended level of sedation: deep   Analgesia:  Fentanyl   Intra-procedure monitoring:  Blood pressure monitoring, cardiac monitor, continuous capnometry, continuous pulse oximetry, frequent LOC assessments and frequent vital sign checks   Intra-procedure events: none     Intra-procedure management:  Airway repositioning   Total Provider sedation time (minutes):  10 Post-procedure details:    Post-sedation assessment completed:  04/25/2022 12:28 PM   Attendance: Constant attendance by certified staff until patient recovered     Recovery: Patient returned to pre-procedure baseline     Post-sedation assessments completed and reviewed: airway patency, cardiovascular function, hydration status, mental status, nausea/vomiting, pain level and respiratory function     Patient is stable for discharge or admission: yes     Procedure completion:  Tolerated well, no immediate complications .Critical Care  Performed by: Regan Lemming, MD Authorized by: Regan Lemming, MD   Critical care provider statement:    Critical care time (minutes):  30   Critical care was time spent personally by me on the following activities:  Development of treatment plan with patient or surrogate, discussions with consultants, evaluation of patient's response to treatment, examination of patient, ordering and review of laboratory studies, ordering and review of radiographic studies, ordering and performing treatments and interventions, pulse oximetry, re-evaluation of patient's condition and review of old charts     Medications Ordered in ED Medications  fentaNYL (SUBLIMAZE) injection 25 mcg (25 mcg Intravenous Not Given 04/25/22 1233)  diltiazem (CARDIZEM) injection 10 mg (10 mg Intravenous Given 04/25/22 0955)  etomidate (AMIDATE) injection 12 mg (12 mg Intravenous Given 04/25/22  1216)  ondansetron (ZOFRAN) injection 4 mg (4 mg Intravenous Given 04/25/22 1137)  fentaNYL (SUBLIMAZE) injection (25 mcg Intravenous Given 04/25/22 1215)  EPINEPHrine (ADRENALIN) 1 MG/10ML injection (12 mg Intravenous Not Given 04/25/22 1216)    ED Course/ Medical Decision Making/ A&P                           Medical Decision Making Amount and/or Complexity of Data Reviewed Labs: ordered. Radiology: ordered. ECG/medicine tests: ordered.  Risk Prescription drug management.  76 year old male with medical history significant for atrial fibrillation on Eliquis, OSA, morbid obesity, chronic diastolic CHF (last echocardiogram revealed an ejection fraction of 50 to 55%) who presents to the emergency department with uncontrolled atrial fibrillation.  The patient states that he has had cold-like symptoms, some nasal congestion, mild productive cough, shortness of breath for the past 2 days.  He has noted that he has had uncontrolled A-fib with generalized weakness for the past 2 days.  He states that he has been compliant with his Eliquis for the past 3 days in a row.  He denies any fevers or chills.  He denies any chest pain.  He states that he is persistently experiencing mild shortness of breath.  He endorses mild bilateral lower extremity edema.  On arrival, the patient was afebrile, temperature 97.5, mildly tachypneic RR 22, in atrial fibrillation with RVR, ventricular rates in the 130s on cardiac telemetry.  EKG revealed atrial fibrillation with RVR with rates in the 130s.  Patient intermittently in and out of A-fib with RVR, then persistently noted to be in RVR with heart rates irregular and over the 110s on telemetry.  He states that he has been compliant with his home metoprolol and has been taking 50 mg twice daily.  He states that he has been compliant with his Eliquis for at least the last 3 days.  He has had to be cardioverted in the past due to A-fib with RVR.  He denies any chest pain.   Does endorse some symptoms consistent with a potential viral URI.  Sore throat, nasal congestion, mild shortness of breath.  Some lower extremity edema bilaterally.  He is on hydrochlorothiazide, does not take Lasix.  Denies any nausea, diaphoresis, active chest pain.  Laboratory evaluation significant for TSH normal, free T4 pending, magnesium normal, initial troponin 7, repeat troponin 7, COVID-19 influenza PCR testing negative, CBC without leukocytosis or anemia, BMP generally unremarkable.  A chest x-ray was performed and revealed no acute cardiac or pulmonary abnormality.  I discussed the patient's case with on-call cardiology, Dr. Sallyanne Kuster.  The patient has been compliant with his diltiazem and metoprolol and presents with A-fib with RVR.  Ventricular rates have ranged between 108 in the high 130s to 140s here.  The patient is compliant with his anticoagulation.  He was consented for procedural sedation for cardioversion for atrial fibrillation with RVR.  This was subsequently performed as per the procedure note above successfully with successful conversion to normal sinus rhythm on first attempt.  Post cardioversion EKG revealed normal sinus rhythm, some artifact present due to residual myoclonic jerks from etomidate administration, ventricular rate 79, P waves present, otherwise unremarkable.  On repeat assessment, the patient was back to his neurologic baseline, clinically well-appearing, remains in normal sinus rhythm.  Vitally stable.  Overall has excellent outpatient follow-up in 2 days with his cardiologist.  Recommended that the patient discuss further options for rhythm control outpatient given his breakthrough atrial fibrillation with RVR today.  Final Clinical Impression(s) / ED Diagnoses Final diagnoses:  Atrial fibrillation with RVR [facility])    Rx / DC Orders ED Discharge Orders     None         Regan Lemming, MD 04/25/22 1256

## 2022-04-25 NOTE — Discharge Instructions (Addendum)
Follow-up with your cardiologist in 2 days at your regular scheduled outpatient appointment.  Resume your anticoagulation as scheduled and your home antiarrhythmics and antihypertensives.

## 2022-04-26 ENCOUNTER — Other Ambulatory Visit: Payer: Self-pay | Admitting: Cardiology

## 2022-04-26 NOTE — Addendum Note (Signed)
Addended by: Freada Bergeron on: 04/26/2022 09:34 AM   Modules accepted: Orders

## 2022-04-27 ENCOUNTER — Encounter (HOSPITAL_COMMUNITY): Payer: Self-pay | Admitting: Nurse Practitioner

## 2022-04-27 ENCOUNTER — Ambulatory Visit (HOSPITAL_COMMUNITY)
Admission: RE | Admit: 2022-04-27 | Discharge: 2022-04-27 | Disposition: A | Discharge status: Home / Self Care | Payer: Medicare HMO | Source: Ambulatory Visit | Attending: Nurse Practitioner | Admitting: Nurse Practitioner

## 2022-04-27 VITALS — BP 136/74 | HR 67 | Ht 73.0 in | Wt 338.8 lb

## 2022-04-27 DIAGNOSIS — J45909 Unspecified asthma, uncomplicated: Secondary | ICD-10-CM | POA: Insufficient documentation

## 2022-04-27 DIAGNOSIS — I5032 Chronic diastolic (congestive) heart failure: Secondary | ICD-10-CM | POA: Insufficient documentation

## 2022-04-27 DIAGNOSIS — D6869 Other thrombophilia: Secondary | ICD-10-CM | POA: Diagnosis not present

## 2022-04-27 DIAGNOSIS — Z7901 Long term (current) use of anticoagulants: Secondary | ICD-10-CM | POA: Insufficient documentation

## 2022-04-27 DIAGNOSIS — I110 Hypertensive heart disease with heart failure: Secondary | ICD-10-CM | POA: Diagnosis not present

## 2022-04-27 DIAGNOSIS — I4819 Other persistent atrial fibrillation: Secondary | ICD-10-CM | POA: Insufficient documentation

## 2022-04-27 MED ORDER — METOPROLOL TARTRATE 25 MG PO TABS: 25.0000 mg | ORAL_TABLET | Freq: Two times a day (BID) | ORAL | Status: DC

## 2022-04-27 MED ORDER — MULTAQ 400 MG PO TABS: 400.0000 mg | ORAL_TABLET | Freq: Two times a day (BID) | ORAL | 3 refills | Status: DC

## 2022-04-27 NOTE — Patient Instructions (Signed)
Start Multaq '400mg'$  twice a day WITH FOOD  Decrease metoprolol to '25mg'$  twice a day

## 2022-04-27 NOTE — Progress Notes (Signed)
Primary Care Physician: [place], Blondell Reveal, MD Referring Physician: Dr. Joanette Gula is a 76 y.o. male with a h/o paroxysmal atrial fibrillation with history of cardioversion on Eliquis and diltiazem, chronic diastolic congestive heart failure, asthma, aortic root dilation, hypertension who presents with concern for tachycardia.   Was sitting and suddenly developed tachycardia. Device will usually say if in afib but did not today--just ready HR up to 137. No chest pain, no dyspnea, no nausea, vomiting, diarrhea, black or bloody stools. Does not think id dehydrated or under more stress. Had regular caffeine today.    Pt is in the afib clinic, 04/23/22 for recent return to afib. His ekg shows afib at 132 bpm. He states he was doing well since May, from time of cardioversion,staying in [place] until recently. He has been going to the gym and lost some weight but has reached a plateau.  His cpap has reached end of life and he is ordering another one.   F/u in afib clinic, 04/27/22. I increased his BB on last visit to try to slow down the RVR, but he started feeling worse and went to the ED and was cardioverted. He is in SR today. He is pending cataract surgery 9/28. We discussed means to keep pt in rhythm, with amiodarone or Tikosyn. Pt does not  care for the hospitalization aspect of Tikosyn or S.E. profile of amiodarone. Since he is in rhythm, I  will try starting Multaq to see if this drug can help maintain SR. I do not want to use flecainide for RBBB present at baseline.   Today, he denies symptoms of palpitations, chest pain, shortness of breath, orthopnea, PND, lower extremity edema, dizziness, presyncope, syncope, or neurologic sequela. The patient is tolerating medications without difficulties and is otherwise without complaint today.   Past Medical History:  Diagnosis Date   Asthma    Cancer ([place])    mild skin   Chronic diastolic CHF (congestive heart failure) (HCC)    Morbid  obesity (HCC)    OSA (obstructive sleep apnea)    PAF (paroxysmal atrial fibrillation) ([place]) 08/14/2015   s/p TEE/DCCV to NSR;  denies palplita    RBBB    atrial fib on cardizem   Past Surgical History:  Procedure Laterality Date   CARDIOVERSION N/A 05/19/2015   Procedure: CARDIOVERSION;  Surgeon: Fay Records, MD;  Location: [facility];  Service: Cardiovascular;  Laterality: N/A;   CARDIOVERSION N/A 12/17/2021   Procedure: CARDIOVERSION;  Surgeon: Fay Records, MD;  Location: [facility] ENDOSCOPY;  Service: Cardiovascular;  Laterality: N/A;   CHOLECYSTECTOMY N/A 12/01/2012   Procedure: LAPAROSCOPIC CHOLECYSTECTOMY;  Surgeon: Gwenyth Ober, MD;  Location: [facility];  Service: General;  Laterality: N/A;   COLONOSCOPY WITH PROPOFOL N/A 07/13/2019   Procedure: COLONOSCOPY WITH PROPOFOL;  Surgeon: Carol Ada, MD;  Location: [facility] ENDOSCOPY;  Service: Endoscopy;  Laterality: N/A;   MASS EXCISION Right 02/20/2018   Procedure: RIGHT THUMB EXCISION MASS AND DEBRIDEMENT DISTAL INTRPHLANGEAL JOINT;  Surgeon: Leanora Cover, MD;  Location: [facility];  Service: Orthopedics;  Laterality: Right;   POLYPECTOMY  07/13/2019   Procedure: POLYPECTOMY;  Surgeon: Carol Ada, MD;  Location: [facility] ENDOSCOPY;  Service: Endoscopy;;   right knee     meniscus removal 6 years ago   SHOULDER SURGERY     TEE WITHOUT CARDIOVERSION N/A 05/19/2015   Procedure: TRANSESOPHAGEAL ECHOCARDIOGRAM (TEE);  Surgeon: Fay Records, MD;  Location: [facility];  Service:  Cardiovascular;  Laterality: N/A;    Current Outpatient Medications  Medication Sig Dispense Refill   apixaban (ELIQUIS) 5 MG TABS tablet Take 1 tablet (5 mg total) by mouth 2 (two) times daily. 60 tablet 3   Cholecalciferol (VITAMIN D3) 2000 UNITS TABS Take 4,000 Units by mouth daily.      diltiazem (CARDIZEM CD) 120 MG 24 hr capsule TAKE 1 CAPSULE BY MOUTH 2 TIMES DAILY. 90 capsule 0   fluticasone (FLONASE) 50 MCG/ACT nasal spray Place 1 spray into both  nostrils daily as needed for allergies or rhinitis.     hydrochlorothiazide (HYDRODIURIL) 25 MG tablet TAKE 1 TABLET BY MOUTH EVERY DAY 90 tablet 3   loratadine (CLARITIN) 10 MG tablet Take 10 mg by mouth daily.     metoprolol tartrate (LOPRESSOR) 25 MG tablet Take two tablets by mouth twice daily     Multiple Vitamins-Minerals (MULTIVITAMIN ADULT) CHEW Chew 2 each by mouth daily.     Omega-3 Fatty Acids (FISH OIL) 1200 MG CAPS Take 1,200 mg by mouth daily.     rosuvastatin (CRESTOR) 5 MG tablet TAKE 1 TABLET (5 MG TOTAL) BY MOUTH DAILY. 90 tablet 0   tamsulosin (FLOMAX) 0.4 MG CAPS capsule TAKE 1 CAPSULE BY MOUTH EVERYDAY AT BEDTIME 90 capsule 0   trolamine salicylate (ASPERCREME) 10 % cream Apply 1 application topically as needed for muscle pain.     No current facility-administered medications for this encounter.    Allergies  Allergen Reactions   Lipitor [Atorvastatin] Other (See Comments)    Painful joints     Social History   Socioeconomic History   Marital status: Married    Spouse name: Not on file   Number of children: Not on file   Years of education: Not on file   Highest education level: Not on file  Occupational History   Not on file  Tobacco Use   Smoking status: Former    Years: 20.00    Types: Cigarettes   Smokeless tobacco: Never  Vaping Use   Vaping Use: Never used  Substance and Sexual Activity   Alcohol use: Yes    Alcohol/week: 0.0 standard drinks of alcohol    Comment: rarely   Drug use: No   Sexual activity: Never  Other Topics Concern   Not on file  Social History Narrative   Not on file   Social Determinants of Health   Financial Resource Strain: Low Risk  (04/02/2022)   Overall Financial Resource Strain (CARDIA)    Difficulty of Paying Living Expenses: Not hard at all  Food Insecurity: No Food Insecurity (04/02/2022)   Hunger Vital Sign    Worried About Running Out of Food in the Last Year: Never true    Ran Out of Food in the Last Year:  Never true  Transportation Needs: No Transportation Needs (04/02/2022)   PRAPARE - Hydrologist (Medical): No    Lack of Transportation (Non-Medical): No  Physical Activity: Sufficiently Active (04/02/2022)   Exercise Vital Sign    Days of Exercise per Week: 3 days    Minutes of Exercise per Session: 60 min  Stress: No Stress Concern Present (04/02/2022)   [place]    Feeling of Stress : Not at all  Social Connections: Moderately Isolated (04/02/2022)   Social Connection and Isolation Panel [NHANES]    Frequency of Communication with Friends and Family: Twice a week    Frequency of  Social Gatherings with Friends and Family: Once a week    Attends Religious Services: Never    Marine scientist or Organizations: No    Attends Archivist Meetings: Never    Marital Status: Married  Human resources officer Violence: Not At Risk (04/02/2022)   Humiliation, Afraid, Rape, and Kick questionnaire    Fear of Current or Ex-Partner: No    Emotionally Abused: No    Physically Abused: No    Sexually Abused: No    Family History  Problem Relation Age of Onset   Hypotension Mother    Heart disease Sister     ROS- All systems are reviewed and negative except as per the HPI above  Physical Exam: Vitals:   04/27/22 1517  BP: 136/74  Pulse: 67  Weight: (!) 153.7 kg  Height: '6\' 1"'$  (1.854 m)   Wt Readings from Last 3 Encounters:  04/27/22 (!) 153.7 kg  04/25/22 (!) 148.8 kg  04/22/22 (!) 151.3 kg    Labs: Lab Results  Component Value Date   NA 142 04/25/2022   K 3.7 04/25/2022   CL 105 04/25/2022   CO2 26 04/25/2022   GLUCOSE 122 (H) 04/25/2022   BUN 32 (H) 04/25/2022   CREATININE 1.23 04/25/2022   CALCIUM 9.4 04/25/2022   PHOS 3.4 05/16/2015   MG 2.1 04/25/2022   Lab Results  Component Value Date   INR 1.3 (H) 04/25/2022   Lab Results  Component Value Date   CHOL 125  12/23/2021   HDL 46 12/23/2021   LDLCALC 68 12/23/2021   TRIG 48 12/23/2021     GEN- The patient is well appearing, alert and oriented x 3 today.   Head- normocephalic, atraumatic Eyes-  Sclera clear, conjunctiva pink Ears- hearing intact Oropharynx- clear Neck- supple, no JVP Lymph- no cervical lymphadenopathy Lungs- Clear to ausculation bilaterally, normal work of breathing Heart- Regular rate and rhythm, no murmurs, rubs or gallops, PMI not laterally displaced GI- soft, NT, ND, + BS Extremities- no clubbing, cyanosis, or edema MS- no significant deformity or atrophy Skin- no rash or lesion Psych- euthymic mood, full affect Neuro- strength and sensation are intact  EKG-   Vent. rate 67 BPM PR interval 146 ms QRS duration 152 ms QT/QTcB 454/479 ms P-R-T axes 37 -41 9 Normal sinus rhythm Left axis deviation Right bundle branch block Minimal voltage criteria for LVH, may be normal variant ( R in aVL ) Abnormal ECG When compared with ECG of 25-Apr-2022 12:21, PREVIOUS ECG IS PRESENT    Assessment and Plan:  1. Persistent  afib  Cardioverted in the ED We dicussed how to maintain SR going forward especially with cataract surgery on 9/28.  We discussed amiodarone or Tikosyn to maintain SR, both may take weeks to be able to get on board We discussed starting Multaq 400 mg bid which may help to maintain SR with upcoming cataract surgery. He will lower metoprolol back to one tab bid  with the addition of Multaq.  He will take with food He will continue cardizem 120 mg bid  Continue eliquis 5 mg bid with a [id number]  score of 4  I will see back in one week with Ekg.    Geroge Baseman Kaleiah Kutzer, [facility] [address] [place], [place] [address] [phone]

## 2022-04-28 ENCOUNTER — Telehealth (HOSPITAL_BASED_OUTPATIENT_CLINIC_OR_DEPARTMENT_OTHER): Payer: Self-pay | Admitting: Cardiology

## 2022-04-28 NOTE — Telephone Encounter (Signed)
Patient would like to transfer cardiac care from Dr. Radford Pax to Dr. Oval Linsey

## 2022-04-29 NOTE — Telephone Encounter (Signed)
Spoke with insurance company. Pt in coverage gap which is causing cost of multaq to increase. Will attempt to apply for patient assistance. If denied will need to look at amiodarone/tikosyn per Roderic Palau

## 2022-05-04 ENCOUNTER — Encounter (HOSPITAL_COMMUNITY): Payer: Self-pay

## 2022-05-04 ENCOUNTER — Encounter (HOSPITAL_COMMUNITY): Payer: Medicare HMO | Admitting: Nurse Practitioner

## 2022-05-05 DIAGNOSIS — G4733 Obstructive sleep apnea (adult) (pediatric): Secondary | ICD-10-CM | POA: Diagnosis not present

## 2022-05-06 DIAGNOSIS — H2181 Floppy iris syndrome: Secondary | ICD-10-CM | POA: Diagnosis not present

## 2022-05-06 DIAGNOSIS — H2511 Age-related nuclear cataract, right eye: Secondary | ICD-10-CM | POA: Diagnosis not present

## 2022-05-06 DIAGNOSIS — H5703 Miosis: Secondary | ICD-10-CM | POA: Diagnosis not present

## 2022-05-06 DIAGNOSIS — Z9841 Cataract extraction status, right eye: Secondary | ICD-10-CM | POA: Insufficient documentation

## 2022-05-07 DIAGNOSIS — H43813 Vitreous degeneration, bilateral: Secondary | ICD-10-CM | POA: Diagnosis not present

## 2022-05-07 DIAGNOSIS — H0289 Other specified disorders of eyelid: Secondary | ICD-10-CM | POA: Diagnosis not present

## 2022-05-07 DIAGNOSIS — H52203 Unspecified astigmatism, bilateral: Secondary | ICD-10-CM | POA: Diagnosis not present

## 2022-05-07 DIAGNOSIS — Z48810 Encounter for surgical aftercare following surgery on the sense organs: Secondary | ICD-10-CM | POA: Diagnosis not present

## 2022-05-07 DIAGNOSIS — H2512 Age-related nuclear cataract, left eye: Secondary | ICD-10-CM | POA: Diagnosis not present

## 2022-05-07 DIAGNOSIS — H5202 Hypermetropia, left eye: Secondary | ICD-10-CM | POA: Diagnosis not present

## 2022-05-07 DIAGNOSIS — H5211 Myopia, right eye: Secondary | ICD-10-CM | POA: Diagnosis not present

## 2022-05-14 ENCOUNTER — Telehealth (HOSPITAL_COMMUNITY): Payer: Self-pay | Admitting: *Deleted

## 2022-05-14 DIAGNOSIS — Z888 Allergy status to other drugs, medicaments and biological substances status: Secondary | ICD-10-CM | POA: Diagnosis not present

## 2022-05-14 DIAGNOSIS — H52203 Unspecified astigmatism, bilateral: Secondary | ICD-10-CM | POA: Diagnosis not present

## 2022-05-14 DIAGNOSIS — H2512 Age-related nuclear cataract, left eye: Secondary | ICD-10-CM | POA: Diagnosis not present

## 2022-05-14 DIAGNOSIS — H5202 Hypermetropia, left eye: Secondary | ICD-10-CM | POA: Diagnosis not present

## 2022-05-14 DIAGNOSIS — Z961 Presence of intraocular lens: Secondary | ICD-10-CM | POA: Diagnosis not present

## 2022-05-14 DIAGNOSIS — H5211 Myopia, right eye: Secondary | ICD-10-CM | POA: Diagnosis not present

## 2022-05-14 DIAGNOSIS — Z48810 Encounter for surgical aftercare following surgery on the sense organs: Secondary | ICD-10-CM | POA: Diagnosis not present

## 2022-05-14 DIAGNOSIS — H0259 Other disorders affecting eyelid function: Secondary | ICD-10-CM | POA: Diagnosis not present

## 2022-05-14 DIAGNOSIS — H43813 Vitreous degeneration, bilateral: Secondary | ICD-10-CM | POA: Diagnosis not present

## 2022-05-14 NOTE — Telephone Encounter (Signed)
Pt approved for multaq patient assistance through 08/08/22.

## 2022-05-17 ENCOUNTER — Encounter (INDEPENDENT_AMBULATORY_CARE_PROVIDER_SITE_OTHER): Payer: Self-pay

## 2022-05-24 ENCOUNTER — Encounter (HOSPITAL_COMMUNITY): Payer: Self-pay

## 2022-05-24 ENCOUNTER — Inpatient Hospital Stay (HOSPITAL_COMMUNITY): Admission: RE | Admit: 2022-05-24 | Payer: Medicare HMO | Source: Ambulatory Visit | Admitting: Nurse Practitioner

## 2022-05-25 ENCOUNTER — Encounter (HOSPITAL_COMMUNITY): Payer: Self-pay | Admitting: Nurse Practitioner

## 2022-05-25 ENCOUNTER — Ambulatory Visit (HOSPITAL_COMMUNITY)
Admission: RE | Admit: 2022-05-25 | Discharge: 2022-05-25 | Disposition: A | Discharge status: Home / Self Care | Payer: Medicare HMO | Source: Ambulatory Visit | Attending: Nurse Practitioner | Admitting: Nurse Practitioner

## 2022-05-25 ENCOUNTER — Encounter (HOSPITAL_BASED_OUTPATIENT_CLINIC_OR_DEPARTMENT_OTHER): Payer: Self-pay | Admitting: Family Medicine

## 2022-05-25 VITALS — BP 150/84 | HR 97

## 2022-05-25 DIAGNOSIS — I4510 Unspecified right bundle-branch block: Secondary | ICD-10-CM | POA: Diagnosis not present

## 2022-05-25 DIAGNOSIS — R9431 Abnormal electrocardiogram [ECG] [EKG]: Secondary | ICD-10-CM | POA: Diagnosis not present

## 2022-05-25 DIAGNOSIS — I480 Paroxysmal atrial fibrillation: Secondary | ICD-10-CM

## 2022-05-25 NOTE — Progress Notes (Signed)
In afib clinic for ekg for starting multaq 400 mg bid . He is in SR at 97 bpm. His qt is recorded at 500 ms, but qt does not  appear that long and RBBB is contributing. He is tolerating well, he is due to have blood work in the month with PCP and I asked for him to have a liver panel done at that time. He is  pending f/u with Dr. Radford Pax in December. He is receiving Multaq thru patient assistance program and his multaq doses that have been received for him were givent to him today.   Vent. rate 97 BPM PR interval 144 ms QRS duration 140 ms QT/QTcB 394/500 ms P-R-T axes 27 -48 25 Sinus rhythm with occasional Premature ventricular complexes Left axis deviation Right bundle branch block Minimal voltage criteria for LVH, may be normal variant ( R in aVL ) Abnormal ECG When compared with ECG of 27-Apr-2022 15:41, PREVIOUS ECG IS PRESENT

## 2022-06-04 DIAGNOSIS — G4733 Obstructive sleep apnea (adult) (pediatric): Secondary | ICD-10-CM | POA: Diagnosis not present

## 2022-06-08 DIAGNOSIS — Z48810 Encounter for surgical aftercare following surgery on the sense organs: Secondary | ICD-10-CM | POA: Diagnosis not present

## 2022-06-08 DIAGNOSIS — H0259 Other disorders affecting eyelid function: Secondary | ICD-10-CM | POA: Diagnosis not present

## 2022-06-08 DIAGNOSIS — H2512 Age-related nuclear cataract, left eye: Secondary | ICD-10-CM | POA: Diagnosis not present

## 2022-06-08 DIAGNOSIS — Z961 Presence of intraocular lens: Secondary | ICD-10-CM | POA: Diagnosis not present

## 2022-06-09 ENCOUNTER — Other Ambulatory Visit (HOSPITAL_COMMUNITY): Payer: Self-pay | Admitting: *Deleted

## 2022-06-09 ENCOUNTER — Other Ambulatory Visit (HOSPITAL_BASED_OUTPATIENT_CLINIC_OR_DEPARTMENT_OTHER): Payer: Self-pay | Admitting: Family Medicine

## 2022-06-09 ENCOUNTER — Encounter (HOSPITAL_BASED_OUTPATIENT_CLINIC_OR_DEPARTMENT_OTHER): Payer: Self-pay | Admitting: Family Medicine

## 2022-06-09 DIAGNOSIS — I5032 Chronic diastolic (congestive) heart failure: Secondary | ICD-10-CM

## 2022-06-09 DIAGNOSIS — E7800 Pure hypercholesterolemia, unspecified: Secondary | ICD-10-CM

## 2022-06-09 MED ORDER — METOPROLOL TARTRATE 25 MG PO TABS: 25.0000 mg | ORAL_TABLET | Freq: Two times a day (BID) | ORAL | 6 refills | Status: DC

## 2022-06-09 MED ORDER — DILTIAZEM HCL ER COATED BEADS 120 MG PO CP24: 120.0000 mg | ORAL_CAPSULE | Freq: Two times a day (BID) | ORAL | 0 refills | Status: DC

## 2022-06-09 MED ORDER — ROSUVASTATIN CALCIUM 5 MG PO TABS: 5.0000 mg | ORAL_TABLET | Freq: Every day | ORAL | 0 refills | Status: DC

## 2022-06-10 ENCOUNTER — Encounter (HOSPITAL_BASED_OUTPATIENT_CLINIC_OR_DEPARTMENT_OTHER): Payer: Self-pay | Admitting: Family Medicine

## 2022-06-11 ENCOUNTER — Other Ambulatory Visit (HOSPITAL_COMMUNITY): Payer: Self-pay

## 2022-06-11 MED ORDER — MULTAQ 400 MG PO TABS: 400.0000 mg | ORAL_TABLET | Freq: Two times a day (BID) | ORAL | 0 refills | Status: DC

## 2022-06-14 ENCOUNTER — Telehealth (HOSPITAL_BASED_OUTPATIENT_CLINIC_OR_DEPARTMENT_OTHER): Payer: Self-pay | Admitting: Family Medicine

## 2022-06-14 NOTE — Telephone Encounter (Signed)
Pt--  Advair 100-50 Diskus   This was originally prescribed by Dr Kathryne Eriksson   [facility]

## 2022-06-15 ENCOUNTER — Encounter (HOSPITAL_BASED_OUTPATIENT_CLINIC_OR_DEPARTMENT_OTHER): Payer: Self-pay

## 2022-06-15 MED ORDER — FLUTICASONE-SALMETEROL 100-50 MCG/ACT IN AEPB: 1.0000 | INHALATION_SPRAY | Freq: Two times a day (BID) | RESPIRATORY_TRACT | 3 refills | Status: DC

## 2022-06-18 ENCOUNTER — Ambulatory Visit: Payer: Medicare HMO | Admitting: Cardiology

## 2022-06-20 ENCOUNTER — Other Ambulatory Visit: Payer: Self-pay | Admitting: Family Medicine

## 2022-06-25 ENCOUNTER — Encounter (HOSPITAL_BASED_OUTPATIENT_CLINIC_OR_DEPARTMENT_OTHER): Payer: Self-pay | Admitting: Family Medicine

## 2022-06-25 MED ORDER — FLUTICASONE-SALMETEROL 100-50 MCG/ACT IN AEPB: 1.0000 | INHALATION_SPRAY | Freq: Two times a day (BID) | RESPIRATORY_TRACT | 3 refills | Status: DC

## 2022-06-30 ENCOUNTER — Other Ambulatory Visit (HOSPITAL_BASED_OUTPATIENT_CLINIC_OR_DEPARTMENT_OTHER): Payer: Self-pay | Admitting: Family Medicine

## 2022-06-30 DIAGNOSIS — E7800 Pure hypercholesterolemia, unspecified: Secondary | ICD-10-CM

## 2022-06-30 DIAGNOSIS — I5032 Chronic diastolic (congestive) heart failure: Secondary | ICD-10-CM

## 2022-06-30 DIAGNOSIS — I10 Essential (primary) hypertension: Secondary | ICD-10-CM

## 2022-07-05 DIAGNOSIS — G4733 Obstructive sleep apnea (adult) (pediatric): Secondary | ICD-10-CM | POA: Diagnosis not present

## 2022-07-07 ENCOUNTER — Encounter (HOSPITAL_BASED_OUTPATIENT_CLINIC_OR_DEPARTMENT_OTHER): Payer: Self-pay | Admitting: Family Medicine

## 2022-07-08 ENCOUNTER — Encounter (HOSPITAL_BASED_OUTPATIENT_CLINIC_OR_DEPARTMENT_OTHER): Payer: Self-pay | Admitting: Family Medicine

## 2022-07-08 ENCOUNTER — Ambulatory Visit (INDEPENDENT_AMBULATORY_CARE_PROVIDER_SITE_OTHER): Payer: Medicare HMO | Admitting: Family Medicine

## 2022-07-08 VITALS — BP 135/80 | HR 75 | Temp 97.7°F | Ht 73.0 in | Wt 339.7 lb

## 2022-07-08 DIAGNOSIS — R051 Acute cough: Secondary | ICD-10-CM | POA: Diagnosis not present

## 2022-07-08 DIAGNOSIS — R059 Cough, unspecified: Secondary | ICD-10-CM | POA: Insufficient documentation

## 2022-07-08 MED ORDER — BENZONATATE 200 MG PO CAPS: 200.0000 mg | ORAL_CAPSULE | Freq: Three times a day (TID) | ORAL | 0 refills | Status: DC | PRN

## 2022-07-08 NOTE — Patient Instructions (Signed)

## 2022-07-08 NOTE — Assessment & Plan Note (Signed)
Patient reports that about 2 and half weeks ago, he began to have symptoms of acute illness including cough, fatigue, rhinorrhea.  Symptoms generally have been improving, however he has continued to have a lingering cough.  Cough was worse last week, however still is affecting day-to-day activities.  He feels that it primarily is most bothersome at night when trying to sleep.  He has not tried any significant over-the-counter medications.  He has not had any recent fevers, chills, sweats.  No other new symptoms such as lower extremity edema. On exam, patient is in no acute distress, vital signs stable.  Intermittent coughing during office visit today.  Cardiovascular exam with regular rate and rhythm.  Lungs clear to auscultation bilaterally. Feel that current cough is most likely related to postviral cough syndrome.  Given general improvement in symptoms with lingering cough, feel that acute bacterial infection is less likely.  Additionally given overall clinical picture, normal pulmonary exam, feel that acute pulmonary process such as pneumonia would be less likely as well.  At this time, would recommend proceeding with symptomatic management.  We discussed over-the-counter options, consideration of honey to help with symptoms.  We will also send prescription to the pharmacy for Tessalon Perles to be utilized as needed to help with cough.  Advised that symptoms should continue to improve over the next 2 to 3 weeks.  If symptoms persist beyond this or if any worsening does occur, recommend returning to the office for further evaluation

## 2022-07-08 NOTE — Progress Notes (Signed)
    Procedures performed today:    None.  Independent interpretation of notes and tests performed by another provider:   None.  Brief History, Exam, Impression, and Recommendations:    BP 135/80 (BP Location: Left Arm, Patient Position: Sitting, Cuff Size: Large)   Pulse 75   Temp 97.7 F (36.5 C) (Oral)   Ht '6\' 1"'$  (1.854 m)   Wt (!) 339 lb 11.2 oz (154.1 kg)   SpO2 100%   BMI 44.82 kg/m   Cough Patient reports that about 2 and half weeks ago, he began to have symptoms of acute illness including cough, fatigue, rhinorrhea.  Symptoms generally have been improving, however he has continued to have a lingering cough.  Cough was worse last week, however still is affecting day-to-day activities.  He feels that it primarily is most bothersome at night when trying to sleep.  He has not tried any significant over-the-counter medications.  He has not had any recent fevers, chills, sweats.  No other new symptoms such as lower extremity edema. On exam, patient is in no acute distress, vital signs stable.  Intermittent coughing during office visit today.  Cardiovascular exam with regular rate and rhythm.  Lungs clear to auscultation bilaterally. Feel that current cough is most likely related to postviral cough syndrome.  Given general improvement in symptoms with lingering cough, feel that acute bacterial infection is less likely.  Additionally given overall clinical picture, normal pulmonary exam, feel that acute pulmonary process such as pneumonia would be less likely as well.  At this time, would recommend proceeding with symptomatic management.  We discussed over-the-counter options, consideration of honey to help with symptoms.  We will also send prescription to the pharmacy for Tessalon Perles to be utilized as needed to help with cough.  Advised that symptoms should continue to improve over the next 2 to 3 weeks.  If symptoms persist beyond this or if any worsening does occur, recommend returning  to the office for further evaluation  Return if symptoms worsen or fail to improve.   ___________________________________________ Victor Carden [place], MD, ABFM, [facility] and [place]

## 2022-07-23 ENCOUNTER — Ambulatory Visit (HOSPITAL_BASED_OUTPATIENT_CLINIC_OR_DEPARTMENT_OTHER): Payer: Medicare HMO | Admitting: Family Medicine

## 2022-07-26 ENCOUNTER — Ambulatory Visit (INDEPENDENT_AMBULATORY_CARE_PROVIDER_SITE_OTHER): Payer: Medicare HMO | Admitting: Family Medicine

## 2022-07-26 ENCOUNTER — Encounter (HOSPITAL_BASED_OUTPATIENT_CLINIC_OR_DEPARTMENT_OTHER): Payer: Self-pay | Admitting: Family Medicine

## 2022-07-26 VITALS — BP 111/75 | HR 70 | Ht 73.0 in | Wt 341.6 lb

## 2022-07-26 DIAGNOSIS — I480 Paroxysmal atrial fibrillation: Secondary | ICD-10-CM | POA: Diagnosis not present

## 2022-07-26 DIAGNOSIS — I10 Essential (primary) hypertension: Secondary | ICD-10-CM

## 2022-07-26 MED ORDER — METOPROLOL TARTRATE 25 MG PO TABS: 25.0000 mg | ORAL_TABLET | Freq: Two times a day (BID) | ORAL | 1 refills | Status: DC

## 2022-07-26 NOTE — Assessment & Plan Note (Signed)
Patient did have cardioversion about 3 months ago.  He does continue with Eliquis, diltiazem, Multaq, metoprolol.  No recent issues with dizziness, lightheadedness, palpitations. On exam today, patient in no acute distress, vital signs stable.  Cardiovascular exam with regular rate and rhythm. Patient appears to be in normal sinus rhythm in office today.  Recommend continuing with current medications. He does plan to switch to cardiology office here at [facility].  Recommend that he reach out to office to arrange for establishing visit with new provider here

## 2022-07-26 NOTE — Assessment & Plan Note (Signed)
Blood pressure appropriate in office today.  No current concerns related chest pain or headaches.  Continues to follow closely with cardiology.  Has been taking medications as prescribed by cardiology Recommend continue with current medications, no changes to be made today Recommend intermittent monitoring of blood pressure at home, DASH diet

## 2022-07-26 NOTE — Progress Notes (Signed)
    Procedures performed today:    None.  Independent interpretation of notes and tests performed by another provider:   None.  Brief History, Exam, Impression, and Recommendations:    BP 111/75 (BP Location: Left Arm, Patient Position: Sitting, Cuff Size: Large)   Pulse 70   Ht '6\' 1"'$  (1.854 m)   Wt (!) 341 lb 9.6 oz (154.9 kg)   SpO2 97%   BMI 45.07 kg/m   HTN (hypertension) Blood pressure appropriate in office today.  No current concerns related chest pain or headaches.  Continues to follow closely with cardiology.  Has been taking medications as prescribed by cardiology Recommend continue with current medications, no changes to be made today Recommend intermittent monitoring of blood pressure at home, DASH diet  PAF (paroxysmal atrial fibrillation) ([place]) Patient did have cardioversion about 3 months ago.  He does continue with Eliquis, diltiazem, Multaq, metoprolol.  No recent issues with dizziness, lightheadedness, palpitations. On exam today, patient in no acute distress, vital signs stable.  Cardiovascular exam with regular rate and rhythm. Patient appears to be in normal sinus rhythm in office today.  Recommend continuing with current medications. He does plan to switch to cardiology office here at [facility].  Recommend that he reach out to office to arrange for establishing visit with new provider here  Return in about 4 months (around 11/25/2022).   ___________________________________________ Averianna Brugger [place], MD, ABFM, [facility] and [place]

## 2022-07-28 ENCOUNTER — Encounter: Payer: Self-pay | Admitting: Cardiology

## 2022-07-29 ENCOUNTER — Ambulatory Visit (HOSPITAL_BASED_OUTPATIENT_CLINIC_OR_DEPARTMENT_OTHER): Payer: Medicare HMO | Admitting: Orthopaedic Surgery

## 2022-07-29 DIAGNOSIS — M25562 Pain in left knee: Secondary | ICD-10-CM

## 2022-07-29 DIAGNOSIS — M25561 Pain in right knee: Secondary | ICD-10-CM

## 2022-07-29 DIAGNOSIS — M1711 Unilateral primary osteoarthritis, right knee: Secondary | ICD-10-CM | POA: Diagnosis not present

## 2022-07-29 DIAGNOSIS — M1712 Unilateral primary osteoarthritis, left knee: Secondary | ICD-10-CM

## 2022-07-29 MED ORDER — TRIAMCINOLONE ACETONIDE 40 MG/ML IJ SUSP
80.0000 mg | INTRAMUSCULAR | Status: AC | PRN
  Administered 2022-07-29: 80 mg via INTRA_ARTICULAR

## 2022-07-29 MED ORDER — LIDOCAINE HCL 1 % IJ SOLN
4.0000 mL | INTRAMUSCULAR | Status: AC | PRN
  Administered 2022-07-29: 4 mL

## 2022-07-29 NOTE — Progress Notes (Signed)
Chief Complaint: Bilateral knee pain     History of Present Illness:   07/29/2022: Presents today for follow-up of his bilateral knee osteoarthritis.  If any relief from previous injections and is requesting additional injections today.  He continues to work out although this has been limited recently as he did have procedure performed.  Victor Medina is a 76 y.o. male presents with chronic knee pain ongoing now both knees for 10 years.  He is having significant pain with going up and down stairs.  He is status post a left knee medial meniscectomy which was done many years prior.  Since that time he has had persistent pain and crepitus about the knees.  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  He has previously had gel injections in both knees again many years prior with no relief.  He is a member at scheduled gym and is working on staying active although his knee pain is limiting his ability to do aerobic exercise.  He has pain with even going on the bike    Surgical History:   None  PMH/PSH/Family History/Social History/Meds/Allergies:    Past Medical History:  Diagnosis Date   Asthma    Cancer ([place])    mild skin   Chronic diastolic CHF (congestive heart failure) (HCC)    Morbid obesity (HCC)    OSA (obstructive sleep apnea)    PAF (paroxysmal atrial fibrillation) ([place]) 08/14/2015   s/p TEE/DCCV to NSR;  denies palplita    RBBB    atrial fib on cardizem   Past Surgical History:  Procedure Laterality Date   CARDIOVERSION N/A 05/19/2015   Procedure: CARDIOVERSION;  Surgeon: Fay Records, MD;  Location: [place];  Service: Cardiovascular;  Laterality: N/A;   CARDIOVERSION N/A 12/17/2021   Procedure: CARDIOVERSION;  Surgeon: Fay Records, MD;  Location: [facility] ENDOSCOPY;  Service: Cardiovascular;  Laterality: N/A;   CHOLECYSTECTOMY N/A 12/01/2012   Procedure: LAPAROSCOPIC CHOLECYSTECTOMY;  Surgeon: Gwenyth Ober, MD;  Location: [place];   Service: General;  Laterality: N/A;   COLONOSCOPY WITH PROPOFOL N/A 07/13/2019   Procedure: COLONOSCOPY WITH PROPOFOL;  Surgeon: Carol Ada, MD;  Location: [facility] ENDOSCOPY;  Service: Endoscopy;  Laterality: N/A;   MASS EXCISION Right 02/20/2018   Procedure: RIGHT THUMB EXCISION MASS AND DEBRIDEMENT DISTAL INTRPHLANGEAL JOINT;  Surgeon: Leanora Cover, MD;  Location: [place];  Service: Orthopedics;  Laterality: Right;   POLYPECTOMY  07/13/2019   Procedure: POLYPECTOMY;  Surgeon: Carol Ada, MD;  Location: [facility] ENDOSCOPY;  Service: Endoscopy;;   right knee     meniscus removal 6 years ago   SHOULDER SURGERY     TEE WITHOUT CARDIOVERSION N/A 05/19/2015   Procedure: TRANSESOPHAGEAL ECHOCARDIOGRAM (TEE);  Surgeon: Fay Records, MD;  Location[facility] ENDOSCOPY;  Service: Cardiovascular;  Laterality: N/A;   Social History   Socioeconomic History   Marital status: Married    Spouse name: Not on file   Number of children: Not on file   Years of education: Not on file   Highest education level: Not on file  Occupational History   Not on file  Tobacco Use   Smoking status: Former    Years: 20.00    Types: Cigarettes   Smokeless tobacco: Never  Vaping Use   Vaping Use: Never used  Substance and Sexual Activity   Alcohol use: Yes    Alcohol/week: 0.0 standard drinks of alcohol    Comment: rarely   Drug use: No   Sexual activity: Never  Other Topics Concern   Not on file  Social History Narrative   Not on file   Social Determinants of Health   Financial Resource Strain: Low Risk  (04/02/2022)   Overall Financial Resource Strain (CARDIA)    Difficulty of Paying Living Expenses: Not hard at all  Food Insecurity: No Food Insecurity (04/02/2022)   Hunger Vital Sign    Worried About Running Out of Food in the Last Year: Never true    Ran Out of Food in the Last Year: Never true  Transportation Needs: No Transportation Needs (04/02/2022)   PRAPARE - Radiographer, therapeutic (Medical): No    Lack of Transportation (Non-Medical): No  Physical Activity: Sufficiently Active (04/02/2022)   Exercise Vital Sign    Days of Exercise per Week: 3 days    Minutes of Exercise per Session: 60 min  Stress: No Stress Concern Present (04/02/2022)   [place]    Feeling of Stress : Not at all  Social Connections: Moderately Isolated (04/02/2022)   Social Connection and Isolation Panel [NHANES]    Frequency of Communication with Friends and Family: Twice a week    Frequency of Social Gatherings with Friends and Family: Once a week    Attends Religious Services: Never    Marine scientist or Organizations: No    Attends Music therapist: Never    Marital Status: Married   Family History  Problem Relation Age of Onset   Hypotension Mother    Heart disease Sister    Allergies  Allergen Reactions   Lipitor [Atorvastatin] Other (See Comments)    Painful joints    Current Outpatient Medications  Medication Sig Dispense Refill   apixaban (ELIQUIS) 5 MG TABS tablet Take 1 tablet (5 mg total) by mouth 2 (two) times daily. 60 tablet 3   benzonatate (TESSALON) 200 MG capsule Take 1 capsule (200 mg total) by mouth 3 (three) times daily as needed for cough. 45 capsule 0   Cholecalciferol (VITAMIN D3) 2000 UNITS TABS Take 4,000 Units by mouth daily.      diltiazem (CARDIZEM CD) 120 MG 24 hr capsule Take 1 capsule (120 mg total) by mouth 2 (two) times daily. 90 capsule 0   dronedarone (MULTAQ) 400 MG tablet Take 1 tablet (400 mg total) by mouth 2 (two) times daily with a meal. 30 tablet 0   fluticasone (FLONASE) 50 MCG/ACT nasal spray Place 1 spray into both nostrils daily as needed for allergies or rhinitis.     fluticasone-salmeterol (ADVAIR) 100-50 MCG/ACT AEPB Inhale 1 puff into the lungs 2 (two) times daily. 1 each 3   hydrochlorothiazide (HYDRODIURIL) 25 MG tablet TAKE 1 TABLET BY  MOUTH EVERY DAY 90 tablet 3   loratadine (CLARITIN) 10 MG tablet Take 10 mg by mouth daily.     metoprolol tartrate (LOPRESSOR) 25 MG tablet Take 1 tablet (25 mg total) by mouth 2 (two) times daily. 180 tablet 1   Multiple Vitamins-Minerals (MULTIVITAMIN ADULT) CHEW Chew 2 each by mouth daily.     Omega-3 Fatty Acids (FISH OIL) 1200 MG CAPS Take 1,200 mg by mouth daily.     rosuvastatin (CRESTOR) 5 MG tablet Take 1 tablet (5 mg total) by mouth daily.  90 tablet 0   tamsulosin (FLOMAX) 0.4 MG CAPS capsule TAKE 1 CAPSULE BY MOUTH EVERYDAY AT BEDTIME 90 capsule 0   trolamine salicylate (ASPERCREME) 10 % cream Apply 1 application topically as needed for muscle pain.     No current facility-administered medications for this visit.   No results found.  Review of Systems:   A ROS was performed including pertinent positives and negatives as documented in the HPI.  Physical Exam :   Constitutional: NAD and appears stated age Neurological: Alert and oriented Psych: Appropriate affect and cooperative There were no vitals taken for this visit.   Comprehensive Musculoskeletal Exam:      Musculoskeletal Exam  Gait Normal  Alignment Normal   Right Left  Inspection Normal Normal  Palpation    Tenderness Tricompartmental Tricompartmental  Crepitus Positive Positive  Effusion None None  Range of Motion    Extension 0 0  Flexion 135 135  Strength    Extension 5/5 5/5  Flexion 5/5 5/5  Ligament Exam     Generalized Laxity No No  Lachman Negative Negative   Pivot Shift Negative Negative  Anterior Drawer Negative Negative  Valgus at 0 Negative Negative  Valgus at 20 Negative Negative  Varus at 0 0 0  Varus at 20   0 0  Posterior Drawer at 90 0 0  Vascular/Lymphatic Exam    Edema None None  Venous Stasis Changes No No  Distal Circulation Normal Normal  Neurologic    Light Touch Sensation Intact Intact  Special Tests:      Imaging:   Xray (4 views right knee, 4 views left  knee): Left worse than right tricompartmental moderate to severe osteoarthritis   I personally reviewed and interpreted the radiographs.   Assessment:   76 year-old male with left worse than right knee tricompartmental osteoarthritis.  Both knees predominantly involve the medial joint space.   Plan :    -Bilateral ultrasound-guided knee injections performed today -Plan for referral to total joint arthroplasty team 6  Procedure Note  Patient: Victor Medina             Date of Birth: Nov 29, 1945           MRN: [id number]             Visit Date: 07/29/2022  Procedures: Visit Diagnoses:  No diagnosis found.   Large Joint Inj: R knee on 07/29/2022 10:55 AM Indications: pain Details: 22 G 1.5 in needle, ultrasound-guided anterior approach  Arthrogram: No  Medications: 4 mL lidocaine 1 %; 80 mg triamcinolone acetonide 40 MG/ML Outcome: tolerated well, no immediate complications Procedure, treatment alternatives, risks and benefits explained, specific risks discussed. Consent was given by the patient. Immediately prior to procedure a time out was called to verify the correct patient, procedure, equipment, support staff and site/side marked as required. Patient was prepped and draped in the usual sterile fashion.    Large Joint Inj: L knee on 07/29/2022 10:55 AM Indications: pain Details: 22 G 1.5 in needle, ultrasound-guided anterior approach  Arthrogram: No  Medications: 4 mL lidocaine 1 %; 80 mg triamcinolone acetonide 40 MG/ML Outcome: tolerated well, no immediate complications Procedure, treatment alternatives, risks and benefits explained, specific risks discussed. Consent was given by the patient. Immediately prior to procedure a time out was called to verify the correct patient, procedure, equipment, support staff and site/side marked as required. Patient was prepped and draped in the usual sterile fashion.  I personally saw and evaluated the patient,  and participated in the management and treatment plan.  Vanetta Mulders, MD Attending Physician, Orthopedic Surgery  This document was dictated using Dragon voice recognition software. A reasonable attempt at proof reading has been made to minimize errors.

## 2022-07-29 NOTE — Telephone Encounter (Signed)
Answered in a different mychart message

## 2022-08-04 DIAGNOSIS — G4733 Obstructive sleep apnea (adult) (pediatric): Secondary | ICD-10-CM | POA: Diagnosis not present

## 2022-08-11 ENCOUNTER — Ambulatory Visit (INDEPENDENT_AMBULATORY_CARE_PROVIDER_SITE_OTHER): Payer: Medicare HMO | Admitting: Orthopaedic Surgery

## 2022-08-11 ENCOUNTER — Ambulatory Visit (INDEPENDENT_AMBULATORY_CARE_PROVIDER_SITE_OTHER): Payer: Medicare HMO

## 2022-08-11 DIAGNOSIS — G8929 Other chronic pain: Secondary | ICD-10-CM | POA: Diagnosis not present

## 2022-08-11 DIAGNOSIS — M1712 Unilateral primary osteoarthritis, left knee: Secondary | ICD-10-CM | POA: Diagnosis not present

## 2022-08-11 DIAGNOSIS — M75101 Unspecified rotator cuff tear or rupture of right shoulder, not specified as traumatic: Secondary | ICD-10-CM

## 2022-08-11 DIAGNOSIS — M19012 Primary osteoarthritis, left shoulder: Secondary | ICD-10-CM | POA: Diagnosis not present

## 2022-08-11 DIAGNOSIS — M19011 Primary osteoarthritis, right shoulder: Secondary | ICD-10-CM | POA: Diagnosis not present

## 2022-08-11 DIAGNOSIS — M25512 Pain in left shoulder: Secondary | ICD-10-CM

## 2022-08-11 DIAGNOSIS — M25511 Pain in right shoulder: Secondary | ICD-10-CM | POA: Diagnosis not present

## 2022-08-11 NOTE — Progress Notes (Signed)
Chief Complaint: Right shoulder pain     History of Present Illness:    Victor Medina is a 77 y.o. male right-hand-dominant male presents with right shoulder pain which has been ongoing now for nearly 10 years.  He does have a history of contralateral rotator cuff tear and repair.  This is done 10 years ago agrees with orthopedics.  He states that he has having progressive weakness and inability to lift anything overhead with any form of strength.  He is taking ibuprofen.  He has been working on a home strengthening routine at the gym without any improvements.  He has noted decrease overhead press strength.  He has been taking ibuprofen for the shoulder without any relief.    Surgical History:   none  PMH/PSH/Family History/Social History/Meds/Allergies:    Past Medical History:  Diagnosis Date   Asthma    Cancer ([place])    mild skin   Chronic diastolic CHF (congestive heart failure) (HCC)    Morbid obesity (HCC)    OSA (obstructive sleep apnea)    PAF (paroxysmal atrial fibrillation) ([place]) 08/14/2015   s/p TEE/DCCV to NSR;  denies palplita    RBBB    atrial fib on cardizem   Past Surgical History:  Procedure Laterality Date   CARDIOVERSION N/A 05/19/2015   Procedure: CARDIOVERSION;  Surgeon: Fay Records, MD;  Location: [place];  Service: Cardiovascular;  Laterality: N/A;   CARDIOVERSION N/A 12/17/2021   Procedure: CARDIOVERSION;  Surgeon: Fay Records, MD;  Location: [facility] ENDOSCOPY;  Service: Cardiovascular;  Laterality: N/A;   CHOLECYSTECTOMY N/A 12/01/2012   Procedure: LAPAROSCOPIC CHOLECYSTECTOMY;  Surgeon: Gwenyth Ober, MD;  Location: [place];  Service: General;  Laterality: N/A;   COLONOSCOPY WITH PROPOFOL N/A 07/13/2019   Procedure: COLONOSCOPY WITH PROPOFOL;  Surgeon: Carol Ada, MD;  Location: [facility] ENDOSCOPY;  Service: Endoscopy;  Laterality: N/A;   MASS EXCISION Right 02/20/2018   Procedure: RIGHT THUMB EXCISION MASS AND  DEBRIDEMENT DISTAL INTRPHLANGEAL JOINT;  Surgeon: Leanora Cover, MD;  Location: [place];  Service: Orthopedics;  Laterality: Right;   POLYPECTOMY  07/13/2019   Procedure: POLYPECTOMY;  Surgeon: Carol Ada, MD;  Location: [facility] ENDOSCOPY;  Service: Endoscopy;;   right knee     meniscus removal 6 years ago   SHOULDER SURGERY     TEE WITHOUT CARDIOVERSION N/A 05/19/2015   Procedure: TRANSESOPHAGEAL ECHOCARDIOGRAM (TEE);  Surgeon: Fay Records, MD;  Location: [facility] ENDOSCOPY;  Service: Cardiovascular;  Laterality: N/A;   Social History   Socioeconomic History   Marital status: Married    Spouse name: Not on file   Number of children: Not on file   Years of education: Not on file   Highest education level: Not on file  Occupational History   Not on file  Tobacco Use   Smoking status: Former    Years: 20.00    Types: Cigarettes   Smokeless tobacco: Never  Vaping Use   Vaping Use: Never used  Substance and Sexual Activity   Alcohol use: Yes    Alcohol/week: 0.0 standard drinks of alcohol    Comment: rarely   Drug use: No   Sexual activity: Never  Other Topics Concern   Not on file  Social History Narrative   Not on file   Social Determinants of  Health   Financial Resource Strain: Low Risk  (04/02/2022)   Overall Financial Resource Strain (CARDIA)    Difficulty of Paying Living Expenses: Not hard at all  Food Insecurity: No Food Insecurity (04/02/2022)   Hunger Vital Sign    Worried About Running Out of Food in the Last Year: Never true    Ran Out of Food in the Last Year: Never true  Transportation Needs: No Transportation Needs (04/02/2022)   PRAPARE - Hydrologist (Medical): No    Lack of Transportation (Non-Medical): No  Physical Activity: Sufficiently Active (04/02/2022)   Exercise Vital Sign    Days of Exercise per Week: 3 days    Minutes of Exercise per Session: 60 min  Stress: No Stress Concern Present (04/02/2022)   [place]    Feeling of Stress : Not at all  Social Connections: Moderately Isolated (04/02/2022)   Social Connection and Isolation Panel [NHANES]    Frequency of Communication with Friends and Family: Twice a week    Frequency of Social Gatherings with Friends and Family: Once a week    Attends Religious Services: Never    Marine scientist or Organizations: No    Attends Music therapist: Never    Marital Status: Married   Family History  Problem Relation Age of Onset   Hypotension Mother    Heart disease Sister    Allergies  Allergen Reactions   Lipitor [Atorvastatin] Other (See Comments)    Painful joints    Current Outpatient Medications  Medication Sig Dispense Refill   apixaban (ELIQUIS) 5 MG TABS tablet Take 1 tablet (5 mg total) by mouth 2 (two) times daily. 60 tablet 3   benzonatate (TESSALON) 200 MG capsule Take 1 capsule (200 mg total) by mouth 3 (three) times daily as needed for cough. 45 capsule 0   Cholecalciferol (VITAMIN D3) 2000 UNITS TABS Take 4,000 Units by mouth daily.      diltiazem (CARDIZEM CD) 120 MG 24 hr capsule Take 1 capsule (120 mg total) by mouth 2 (two) times daily. 90 capsule 0   dronedarone (MULTAQ) 400 MG tablet Take 1 tablet (400 mg total) by mouth 2 (two) times daily with a meal. 30 tablet 0   fluticasone (FLONASE) 50 MCG/ACT nasal spray Place 1 spray into both nostrils daily as needed for allergies or rhinitis.     fluticasone-salmeterol (ADVAIR) 100-50 MCG/ACT AEPB Inhale 1 puff into the lungs 2 (two) times daily. 1 each 3   hydrochlorothiazide (HYDRODIURIL) 25 MG tablet TAKE 1 TABLET BY MOUTH EVERY DAY 90 tablet 3   loratadine (CLARITIN) 10 MG tablet Take 10 mg by mouth daily.     metoprolol tartrate (LOPRESSOR) 25 MG tablet Take 1 tablet (25 mg total) by mouth 2 (two) times daily. 180 tablet 1   Multiple Vitamins-Minerals (MULTIVITAMIN ADULT) CHEW Chew 2 each by mouth  daily.     Omega-3 Fatty Acids (FISH OIL) 1200 MG CAPS Take 1,200 mg by mouth daily.     rosuvastatin (CRESTOR) 5 MG tablet Take 1 tablet (5 mg total) by mouth daily. 90 tablet 0   tamsulosin (FLOMAX) 0.4 MG CAPS capsule TAKE 1 CAPSULE BY MOUTH EVERYDAY AT BEDTIME 90 capsule 0   trolamine salicylate (ASPERCREME) 10 % cream Apply 1 application topically as needed for muscle pain.     No current facility-administered medications for this visit.   No results found.  Review of Systems:   A ROS was performed including pertinent positives and negatives as documented in the HPI.  Physical Exam :   Constitutional: NAD and appears stated age Neurological: Alert and oriented Psych: Appropriate affect and cooperative There were no vitals taken for this visit.   Comprehensive Musculoskeletal Exam:    Musculoskeletal Exam    Inspection Right Left  Skin No atrophy or winging No atrophy or winging  Palpation    Tenderness Lateral deltoid None  Range of Motion    Flexion (passive) 170 170  Flexion (active) 170 170  Abduction 170 170  ER at the side 70 70  Can reach behind back to T12 T12  Strength     4/5 s supraspinatus, negative belly press 5/5  Special Tests    Pseudoparalytic No No  Neurologic    Fires PIN, radial, median, ulnar, musculocutaneous, axillary, suprascapular, long thoracic, and spinal accessory innervated muscles. No abnormal sensibility  Vascular/Lymphatic    Radial Pulse 2+ 2+  Cervical Exam    Patient has symmetric cervical range of motion with negative Spurling's test.  Special Test:      Imaging:   Xray (3 views right shoulder, 3 views left shoulder): Normal with evidence of previous distal clavicle injury on the right    I personally reviewed and interpreted the radiographs.   Assessment:   77 y.o. male with right shoulder pain ongoing now for approximately 10 years.  At this time he has tried multiple modalities including activity restriction and  ibuprofen.  At this time I am quite concerned that he has significant weakness with overhead activity.  To this fact I do believe that he does have a rotator cuff injury.  At this time I would like to obtain an MRI to see if there is any type of underlying rotator cuff injury and follow-up to discuss results.  Plan :    -Plan for MRI and follow-up to discuss results     I personally saw and evaluated the patient, and participated in the management and treatment plan.  Vanetta Mulders, MD Attending Physician, Orthopedic Surgery  This document was dictated using Dragon voice recognition software. A reasonable attempt at proof reading has been made to minimize errors.

## 2022-08-13 DIAGNOSIS — Z0100 Encounter for examination of eyes and vision without abnormal findings: Secondary | ICD-10-CM | POA: Diagnosis not present

## 2022-08-26 ENCOUNTER — Other Ambulatory Visit (HOSPITAL_BASED_OUTPATIENT_CLINIC_OR_DEPARTMENT_OTHER): Payer: Self-pay | Admitting: Family Medicine

## 2022-08-26 DIAGNOSIS — E7800 Pure hypercholesterolemia, unspecified: Secondary | ICD-10-CM

## 2022-09-02 ENCOUNTER — Encounter (HOSPITAL_BASED_OUTPATIENT_CLINIC_OR_DEPARTMENT_OTHER): Payer: Self-pay | Admitting: Orthopaedic Surgery

## 2022-09-02 ENCOUNTER — Ambulatory Visit (HOSPITAL_BASED_OUTPATIENT_CLINIC_OR_DEPARTMENT_OTHER)
Admission: RE | Admit: 2022-09-02 | Discharge: 2022-09-02 | Disposition: A | Discharge status: Home / Self Care | Payer: Medicare HMO | Source: Ambulatory Visit | Attending: Orthopaedic Surgery | Admitting: Orthopaedic Surgery

## 2022-09-02 DIAGNOSIS — M75101 Unspecified rotator cuff tear or rupture of right shoulder, not specified as traumatic: Secondary | ICD-10-CM

## 2022-09-02 DIAGNOSIS — M25511 Pain in right shoulder: Secondary | ICD-10-CM | POA: Diagnosis not present

## 2022-09-02 DIAGNOSIS — M67813 Other specified disorders of tendon, right shoulder: Secondary | ICD-10-CM | POA: Insufficient documentation

## 2022-09-03 ENCOUNTER — Ambulatory Visit: Payer: Medicare HMO | Attending: Cardiology | Admitting: Cardiology

## 2022-09-03 ENCOUNTER — Telehealth (HOSPITAL_COMMUNITY): Payer: Self-pay | Admitting: *Deleted

## 2022-09-03 ENCOUNTER — Encounter: Payer: Self-pay | Admitting: Cardiology

## 2022-09-03 VITALS — BP 110/62 | HR 72 | Ht 73.0 in | Wt 340.4 lb

## 2022-09-03 DIAGNOSIS — I10 Essential (primary) hypertension: Secondary | ICD-10-CM | POA: Diagnosis not present

## 2022-09-03 DIAGNOSIS — I480 Paroxysmal atrial fibrillation: Secondary | ICD-10-CM | POA: Diagnosis not present

## 2022-09-03 DIAGNOSIS — I77810 Thoracic aortic ectasia: Secondary | ICD-10-CM

## 2022-09-03 DIAGNOSIS — E7800 Pure hypercholesterolemia, unspecified: Secondary | ICD-10-CM

## 2022-09-03 DIAGNOSIS — G4733 Obstructive sleep apnea (adult) (pediatric): Secondary | ICD-10-CM

## 2022-09-03 DIAGNOSIS — I5032 Chronic diastolic (congestive) heart failure: Secondary | ICD-10-CM | POA: Diagnosis not present

## 2022-09-03 MED ORDER — FUROSEMIDE 20 MG PO TABS: 20.0000 mg | ORAL_TABLET | Freq: Every day | ORAL | 3 refills | Status: AC

## 2022-09-03 NOTE — Telephone Encounter (Signed)
Patient approved for [employer] patient assistance through 08/09/23. PAT [id number]

## 2022-09-03 NOTE — Addendum Note (Signed)
Addended by: Joni Reining on: 09/03/2022 10:03 AM   Modules accepted: Orders

## 2022-09-03 NOTE — Progress Notes (Addendum)
Cardiology Office Note   Date:  09/03/2022   ID:  KAIAN FAHS, DOB 1946/04/07, MRN [id number]  PCP:  de [place], Raymond J, MD  Cardiologist:  Dr. Radford Pax, MD   Chief Complaint  Patient presents with   Hypertension   Atrial Fibrillation   Congestive Heart Failure   Sleep Apnea      History of Present Illness: Victor Medina is a 77 y.o. male with a history of PAF s/p cardioversion in 2016, mildly dilated aortic root, chronic diastolic CHF, chonic LE edema, RBBB, OSA on CPAP.  He underwent an echocardiogram 09/2016 with normal LVEF with normal aortic root.  He is on apixaban 5 mg twice daily for CHA2DS2-VASc score of 2.  He had recurrent A-fib and underwent DCCV in May 2023 but then reverted back to A-fib and was seen in A-fib clinic 04/23/2022.  Beta-blocker was increased and underwent repeat cardioversion in the ER in September 2023.  Follow-up in A-fib clinic 04/27/2022 and at that time he was started on Multaq 400 mg twice daily.    He is here today for followup and is doing well.  He denies any chest pain or pressure, SOB, DOE, PND, orthopnea, dizziness or syncope. He has chronic LE edema that is stable.He has not had any further palpitations since Sept 23.  He goes to the gym 3 times weekly.  He is compliant with his meds and is tolerating meds with no SE.    He is doing well with his PAP device and thinks that he has gotten used to it.  He tolerates the mask and feels the pressure is adequate.  He has to get up a lot at night to go to the bathroom which causes him to be sleepy some during the day.  He denies any significant mouth or nasal dryness or nasal congestion.  He does not think that he snores.    Past Medical History:  Diagnosis Date   Asthma    Cancer ([place])    mild skin   Chronic diastolic CHF (congestive heart failure) (H[facility])    Morbid obesity (H[facility])    OSA (obstructive sleep apnea)    PAF (paroxysmal atrial fibrillation) ([place]) 08/14/2015   s/p TEE/DCCV to  NSR;  denies palplita    RBBB    atrial fib on cardizem    Past Surgical History:  Procedure Laterality Date   CARDIOVERSION N/A 05/19/2015   Procedure: CARDIOVERSION;  Surgeon: Fay Records, MD;  Location: [place];  Service: Cardiovascular;  Laterality: N/A;   CARDIOVERSION N/A 12/17/2021   Procedure: CARDIOVERSION;  Surgeon: Fay Records, MD;  Location: [facility] ENDOSCOPY;  Service: Cardiovascular;  Laterality: N/A;   CHOLECYSTECTOMY N/A 12/01/2012   Procedure: LAPAROSCOPIC CHOLECYSTECTOMY;  Surgeon: Gwenyth Ober, MD;  Location: [place];  Service: General;  Laterality: N/A;   COLONOSCOPY WITH PROPOFOL N/A 07/13/2019   Procedure: COLONOSCOPY WITH PROPOFOL;  Surgeon: Carol Ada, MD;  Location: [facility] ENDOSCOPY;  Service: Endoscopy;  Laterality: N/A;   MASS EXCISION Right 02/20/2018   Procedure: RIGHT THUMB EXCISION MASS AND DEBRIDEMENT DISTAL INTRPHLANGEAL JOINT;  Surgeon: Leanora Cover, MD;  Location: [place];  Service: Orthopedics;  Laterality: Right;   POLYPECTOMY  07/13/2019   Procedure: POLYPECTOMY;  Surgeon: Carol Ada, MD;  Location: [facility] ENDOSCOPY;  Service: Endoscopy;;   right knee     meniscus removal 6 years ago   SHOULDER SURGERY     TEE WITHOUT CARDIOVERSION N/A 05/19/2015  Procedure: TRANSESOPHAGEAL ECHOCARDIOGRAM (TEE);  Surgeon: Fay Records, MD;  Location: [facility] ENDOSCOPY;  Service: Cardiovascular;  Laterality: N/A;     Current Outpatient Medications  Medication Sig Dispense Refill   apixaban (ELIQUIS) 5 MG TABS tablet Take 1 tablet (5 mg total) by mouth 2 (two) times daily. 60 tablet 3   Cholecalciferol (VITAMIN D3) 2000 UNITS TABS Take 4,000 Units by mouth daily.      diltiazem (CARDIZEM CD) 120 MG 24 hr capsule Take 1 capsule (120 mg total) by mouth 2 (two) times daily. 90 capsule 0   dronedarone (MULTAQ) 400 MG tablet Take 1 tablet (400 mg total) by mouth 2 (two) times daily with a meal. 30 tablet 0   fluticasone (FLONASE) 50 MCG/ACT nasal spray Place 1  spray into both nostrils daily as needed for allergies or rhinitis.     fluticasone-salmeterol (ADVAIR) 100-50 MCG/ACT AEPB Inhale 1 puff into the lungs 2 (two) times daily. 1 each 3   hydrochlorothiazide (HYDRODIURIL) 25 MG tablet TAKE 1 TABLET BY MOUTH EVERY DAY 90 tablet 3   loratadine (CLARITIN) 10 MG tablet Take 10 mg by mouth daily.     MAGNESIUM PO Take 250 mg by mouth as directed.     metoprolol tartrate (LOPRESSOR) 25 MG tablet Take 1 tablet (25 mg total) by mouth 2 (two) times daily. 180 tablet 1   Multiple Vitamins-Minerals (MULTIVITAMIN ADULT) CHEW Chew 2 each by mouth daily.     Omega-3 Fatty Acids (FISH OIL) 1200 MG CAPS Take 1,200 mg by mouth daily.     rosuvastatin (CRESTOR) 5 MG tablet TAKE 1 TABLET (5 MG TOTAL) BY MOUTH DAILY. 90 tablet 0   tamsulosin (FLOMAX) 0.4 MG CAPS capsule TAKE 1 CAPSULE BY MOUTH EVERYDAY AT BEDTIME 90 capsule 0   trolamine salicylate (ASPERCREME) 10 % cream Apply 1 application topically as needed for muscle pain.     benzonatate (TESSALON) 200 MG capsule Take 1 capsule (200 mg total) by mouth 3 (three) times daily as needed for cough. 45 capsule 0   No current facility-administered medications for this visit.    Allergies:   Lipitor [atorvastatin]    Social History:  The patient  reports that he has quit smoking. His smoking use included cigarettes. He has never used smokeless tobacco. He reports current alcohol use. He reports that he does not use drugs.   Family History:  The patient's family history includes Heart disease in his sister; Hypotension in his mother.    ROS:  Please see the history of present illness.   Otherwise, review of systems are positive for none.   All other systems are reviewed and negative.    PHYSICAL EXAM: VS:  BP 110/62   Pulse 72   Ht '6\' 1"'$  (1.854 m)   Wt (!) 340 lb 6.4 oz (154.4 kg)   SpO2 96%   BMI 44.91 kg/m  , BMI Body mass index is 44.91 kg/m.   GEN: Well nourished, well developed in no acute  distress HEENT: Normal NECK: No JVD; No carotid bruits LYMPHATICS: No lymphadenopathy CARDIAC:RRR, no murmurs, rubs, gallops RESPIRATORY:  Clear to auscultation without rales, wheezing or rhonchi  ABDOMEN: Soft, non-tender, non-distended MUSCULOSKELETAL:  2+ pitting LE edema; No deformity  SKIN: Warm and dry NEUROLOGIC:  Alert and oriented x 3 PSYCHIATRIC:  Normal affect   EKG:  EKG is not ordered today.  Recent Labs: 12/23/2021: ALT 12 04/25/2022: BUN 32; Creatinine, Ser 1.23; Hemoglobin 15.5; Magnesium 2.1; Platelets 245; Potassium 3.7;  Sodium 142; TSH 4.363   Lipid Panel    Component Value Date/Time   CHOL 125 12/23/2021 0948   TRIG 48 12/23/2021 0948   HDL 46 12/23/2021 0948   CHOLHDL 2.7 12/23/2021 0948   LDLCALC 68 12/23/2021 0948      Wt Readings from Last 3 Encounters:  09/03/22 (!) 340 lb 6.4 oz (154.4 kg)  07/26/22 (!) 341 lb 9.6 oz (154.9 kg)  07/08/22 (!) 339 lb 11.2 oz (154.1 kg)    ASSESSMENT AND PLAN:  1.  PAF: -S/P DCCV in 2016 and May 2023 -Recurrent A-fib 9/23 and beta-blocker uptitrated but presented to the ER a few days later and underwent cardioversion -Started on Multaq in September 2023 in A-fib clinic -He remains in normal sinus rhythm today and denies any palpitations -Continue prescription drug management with apixaban 5 mg twice daily, Cardizem CD 120 mg daily, Multaq 400 mg twice daily, Lopressor 25 mg twice daily with PRN Refills -I have personally reviewed and interpreted outside labs performed by patient's PCP which showed serum creatinine 1.23 and potassium 3.7 and hemoglobin 15.5 on 04/25/2022  2.  Chronic diastolic CHF: -2D echo 2/67/1245 with EF 50 to 55% and grade 1 diastolic dysfunction -lungs are clear today but he has significant LE edema likely related to morbid obesity -stop HCTZ -add Lasix '20mg'$  tabke 2 tablets daily for 3 days then 1 tablet daily -check BMET and Mag in 1 week  3.  Hypertension: -BP is controlled on exam  today -Continue prescription drug management with Cardizem CD 120 mg daily and Lopressor 25 mg twice daily with as needed refills  4.  OSA - The patient is tolerating PAP therapy well without any problems. The PAP download performed by his DME was personally reviewed and interpreted by me today and showed an AHI of 1.6 /hr on auto CPAP from 5-10 cm H2O with 100 % compliance in using more than 4 hours nightly.  The patient has been using and benefiting from PAP use and will continue to benefit from therapy.    5.  HLD: -LDL goal < 100 -I have personally reviewed and interpreted outside labs performed by patient's PCP which showed LDL 157, HDL 38, TAG 214 in Dec 2021 -Continue prescription drug management with Crestor '5mg'$  daily >> does not tolerate higher doses -repeat FLP and ALT    6.  Dilated aortic root -aortic root measured 4m on echo 2018 -repeat echo to reassess  Followup with me in 6 months   Current medicines are reviewed at length with the patient today.  The patient does not have concerns regarding medicines.   Labs/ tests ordered today include: Lipid panel, CMET and 2D echo No orders of the defined types were placed in this encounter.     Disposition:   FU with me in 1 year  Signed, TFransico Him MD  09/03/2022 9:38 AM    [address] [place]  [id number]: [phone] Fax: [phone]

## 2022-09-03 NOTE — Patient Instructions (Addendum)
Medication Instructions:  Stop taking hydrochlorothiazide (25 mg). Start taking lasix 20 mg. Take 2 tablets daily for 3 days, then reduce dose to 1 tablet daily.   *If you need a refill on your cardiac medications before your next appointment, please call your pharmacy*   Lab Work: Please make an appointment to complete a FASTING lipid lab, a CMET and a Magnesium level in our lab in one week.  If you have labs (blood work) drawn today and your tests are completely normal, you will receive your results only by: [place] (if you have MyChart) OR A paper copy in the mail If you have any lab test that is abnormal or we need to change your treatment, we will call you to review the results.   Testing/Procedures: Your physician has requested that you have an echocardiogram. Echocardiography is a painless test that uses sound waves to create images of your heart. It provides your doctor with information about the size and shape of your heart and how well your heart's chambers and valves are working. This procedure takes approximately one hour. There are no restrictions for this procedure. Please do NOT wear cologne, perfume, aftershave, or lotions (deodorant is allowed). Please arrive 15 minutes prior to your appointment time.    Follow-Up:   Your next appointment:   6 month(s)  Provider:   Fransico Him, MD

## 2022-09-04 DIAGNOSIS — G4733 Obstructive sleep apnea (adult) (pediatric): Secondary | ICD-10-CM | POA: Diagnosis not present

## 2022-09-08 ENCOUNTER — Ambulatory Visit (INDEPENDENT_AMBULATORY_CARE_PROVIDER_SITE_OTHER): Payer: Medicare HMO | Admitting: Orthopaedic Surgery

## 2022-09-08 DIAGNOSIS — M75101 Unspecified rotator cuff tear or rupture of right shoulder, not specified as traumatic: Secondary | ICD-10-CM | POA: Diagnosis not present

## 2022-09-08 MED ORDER — LIDOCAINE HCL 1 % IJ SOLN
4.0000 mL | INTRAMUSCULAR | Status: AC | PRN
  Administered 2022-09-08: 4 mL

## 2022-09-08 MED ORDER — TRIAMCINOLONE ACETONIDE 40 MG/ML IJ SUSP
80.0000 mg | INTRAMUSCULAR | Status: AC | PRN
  Administered 2022-09-08: 80 mg via INTRA_ARTICULAR

## 2022-09-08 NOTE — Progress Notes (Signed)
Chief Complaint: Right shoulder pain     History of Present Illness:   09/08/2022: Presents today for follow-up of his right shoulder.  He is still having persistent pain and weakness overhead  Victor Medina is a 77 y.o. male right-hand-dominant male presents with right shoulder pain which has been ongoing now for nearly 10 years.  He does have a history of contralateral rotator cuff tear and repair.  This is done 10 years ago agrees with orthopedics.  He states that he has having progressive weakness and inability to lift anything overhead with any form of strength.  He is taking ibuprofen.  He has been working on a home strengthening routine at the gym without any improvements.  He has noted decrease overhead press strength.  He has been taking ibuprofen for the shoulder without any relief.    Surgical History:   none  PMH/PSH/Family History/Social History/Meds/Allergies:    Past Medical History:  Diagnosis Date  . Asthma   . Cancer (HCC)    mild skin  . Chronic diastolic CHF (congestive heart failure) ([place])   . Morbid obesity ([place])   . OSA (obstructive sleep apnea)   . PAF (paroxysmal atrial fibrillation) ([place]) 08/14/2015   s/p TEE/DCCV to NSR;  denies palplita   . RBBB    atrial fib on cardizem   Past Surgical History:  Procedure Laterality Date  . CARDIOVERSION N/A 05/19/2015   Procedure: CARDIOVERSION;  Surgeon: Fay Records, MD;  Location: [facility] ENDOSCOPY;  Service: Cardiovascular;  Laterality: N/A;  . CARDIOVERSION N/A 12/17/2021   Procedure: CARDIOVERSION;  Surgeon: Fay Records, MD;  Location: [facility] ENDOSCOPY;  Service: Cardiovascular;  Laterality: N/A;  . CHOLECYSTECTOMY N/A 12/01/2012   Procedure: LAPAROSCOPIC CHOLECYSTECTOMY;  Surgeon: Gwenyth Ober, MD;  Location: [place];  Service: General;  Laterality: N/A;  . COLONOSCOPY WITH PROPOFOL N/A 07/13/2019   Procedure: COLONOSCOPY WITH PROPOFOL;  Surgeon: Carol Ada, MD;  Location: [facility]  ENDOSCOPY;  Service: Endoscopy;  Laterality: N/A;  . MASS EXCISION Right 02/20/2018   Procedure: RIGHT THUMB EXCISION MASS AND DEBRIDEMENT DISTAL INTRPHLANGEAL JOINT;  Surgeon: Leanora Cover, MD;  Location: [place];  Service: Orthopedics;  Laterality: Right;  . POLYPECTOMY  07/13/2019   Procedure: POLYPECTOMY;  Surgeon: Carol Ada, MD;  Location: [facility] ENDOSCOPY;  Service: Endoscopy;;  . right knee     meniscus removal 6 years ago  . SHOULDER SURGERY    . TEE WITHOUT CARDIOVERSION N/A 05/19/2015   Procedure: TRANSESOPHAGEAL ECHOCARDIOGRAM (TEE);  Surgeon: Fay Records, MD;  Location: [facility] ENDOSCOPY;  Service: Cardiovascular;  Laterality: N/A;   Social History   Socioeconomic History  . Marital status: Married    Spouse name: Not on file  . Number of children: Not on file  . Years of education: Not on file  . Highest education level: Not on file  Occupational History  . Not on file  Tobacco Use  . Smoking status: Former    Years: 20.00    Types: Cigarettes  . Smokeless tobacco: Never  Vaping Use  . Vaping Use: Never used  Substance and Sexual Activity  . Alcohol use: Yes    Alcohol/week: 0.0 standard drinks of alcohol    Comment: rarely  . Drug use: No  . Sexual activity: Never  Other Topics Concern  .  Not on file  Social History Narrative  . Not on file   Social Determinants of Health   Financial Resource Strain: Low Risk  (04/02/2022)   Overall Financial Resource Strain (CARDIA)   . Difficulty of Paying Living Expenses: Not hard at all  Food Insecurity: No Food Insecurity (04/02/2022)   Hunger Vital Sign   . Worried About Charity fundraiser in the Last Year: Never true   . Ran Out of Food in the Last Year: Never true  Transportation Needs: No Transportation Needs (04/02/2022)   PRAPARE - Transportation   . Lack of Transportation (Medical): No   . Lack of Transportation (Non-Medical): No  Physical Activity: Sufficiently Active (04/02/2022)   Exercise  Vital Sign   . Days of Exercise per Week: 3 days   . Minutes of Exercise per Session: 60 min  Stress: No Stress Concern Present (04/02/2022)   [place]   . Feeling of Stress : Not at all  Social Connections: Moderately Isolated (04/02/2022)   Social Connection and Isolation Panel [NHANES]   . Frequency of Communication with Friends and Family: Twice a week   . Frequency of Social Gatherings with Friends and Family: Once a week   . Attends Religious Services: Never   . Active Member of Clubs or Organizations: No   . Attends Archivist Meetings: Never   . Marital Status: Married   Family History  Problem Relation Age of Onset  . Hypotension Mother   . Heart disease Sister    Allergies  Allergen Reactions  . Lipitor [Atorvastatin] Other (See Comments)    Painful joints    Current Outpatient Medications  Medication Sig Dispense Refill  . apixaban (ELIQUIS) 5 MG TABS tablet Take 1 tablet (5 mg total) by mouth 2 (two) times daily. 60 tablet 3  . benzonatate (TESSALON) 200 MG capsule Take 1 capsule (200 mg total) by mouth 3 (three) times daily as needed for cough. 45 capsule 0  . Cholecalciferol (VITAMIN D3) 2000 UNITS TABS Take 4,000 Units by mouth daily.     Marland Kitchen diltiazem (CARDIZEM CD) 120 MG 24 hr capsule Take 1 capsule (120 mg total) by mouth 2 (two) times daily. 90 capsule 0  . dronedarone (MULTAQ) 400 MG tablet Take 1 tablet (400 mg total) by mouth 2 (two) times daily with a meal. 30 tablet 0  . fluticasone (FLONASE) 50 MCG/ACT nasal spray Place 1 spray into both nostrils daily as needed for allergies or rhinitis.    . fluticasone-salmeterol (ADVAIR) 100-50 MCG/ACT AEPB Inhale 1 puff into the lungs 2 (two) times daily. 1 each 3  . furosemide (LASIX) 20 MG tablet Take 1 tablet (20 mg total) by mouth daily. Take 2 tabs daily for 3 days, then reduce dose to one tablet daily. 90 tablet 3  . loratadine (CLARITIN)  10 MG tablet Take 10 mg by mouth daily.    Marland Kitchen MAGNESIUM PO Take 250 mg by mouth as directed.    . metoprolol tartrate (LOPRESSOR) 25 MG tablet Take 1 tablet (25 mg total) by mouth 2 (two) times daily. 180 tablet 1  . Multiple Vitamins-Minerals (MULTIVITAMIN ADULT) CHEW Chew 2 each by mouth daily.    . Omega-3 Fatty Acids (FISH OIL) 1200 MG CAPS Take 1,200 mg by mouth daily.    . rosuvastatin (CRESTOR) 5 MG tablet TAKE 1 TABLET (5 MG TOTAL) BY MOUTH DAILY. 90 tablet 0  . tamsulosin (FLOMAX) 0.4 MG CAPS  capsule TAKE 1 CAPSULE BY MOUTH EVERYDAY AT BEDTIME 90 capsule 0  . trolamine salicylate (ASPERCREME) 10 % cream Apply 1 application topically as needed for muscle pain.     No current facility-administered medications for this visit.   No results found.  Review of Systems:   A ROS was performed including pertinent positives and negatives as documented in the HPI.  Physical Exam :   Constitutional: NAD and appears stated age Neurological: Alert and oriented Psych: Appropriate affect and cooperative There were no vitals taken for this visit.   Comprehensive Musculoskeletal Exam:    Musculoskeletal Exam    Inspection Right Left  Skin No atrophy or winging No atrophy or winging  Palpation    Tenderness Lateral deltoid None  Range of Motion    Flexion (passive) 170 170  Flexion (active) 170 170  Abduction 170 170  ER at the side 70 70  Can reach behind back to T12 T12  Strength     4/5 s supraspinatus, negative belly press 5/5  Special Tests    Pseudoparalytic No No  Neurologic    Fires PIN, radial, median, ulnar, musculocutaneous, axillary, suprascapular, long thoracic, and spinal accessory innervated muscles. No abnormal sensibility  Vascular/Lymphatic    Radial Pulse 2+ 2+  Cervical Exam    Patient has symmetric cervical range of motion with negative Spurling's test.  Special Test:      Imaging:   Xray (3 views right shoulder, 3 views left shoulder): Normal with evidence  of previous distal clavicle injury on the right  MRI right shoulder; Small undersurface tear of the right shoulder supraspinatus tendon with fluid around the biceps consistent with biceps tendinitis.  I personally reviewed and interpreted the radiographs.   Assessment:   77 y.o. male with right shoulder pain ongoing now for approximately 10 years.  At this time he has tried multiple modalities including activity restriction and ibuprofen.  I did discuss that he does have a small tear on the undersurface of his rotator cuff.  That effect I would recommend an injection of the right shoulder today and plan to enroll him in physical therapy for strengthening of the rotator cuff.  Would like him to begin with this as I did discuss given the fact that his tear is overall extremely small there is a high chance of him progressing without surgical intervention Plan :    -Right ultrasound-guided injection performed today     Procedure Note  Patient: Victor Medina             Date of Birth: [birth year]           MRN: [id number]             Visit Date: 09/08/2022  Procedures: Visit Diagnoses:  1. Nontraumatic tear of right rotator cuff, unspecified tear extent     Large Joint Inj on 09/08/2022 4:39 PM Indications: pain Details: 22 G 1.5 in needle, ultrasound-guided anterior approach  Arthrogram: No  Medications: 4 mL lidocaine 1 %; 80 mg triamcinolone acetonide 40 MG/ML Outcome: tolerated well, no immediate complications Procedure, treatment alternatives, risks and benefits explained, specific risks discussed. Consent was given by the patient. Immediately prior to procedure a time out was called to verify the correct patient, procedure, equipment, support staff and site/side marked as required. Patient was prepped and draped in the usual sterile fashion.       I personally saw and evaluated the patient, and participated in the management and treatment plan.  Vanetta Mulders,  MD Attending Physician, Orthopedic Surgery  This document was dictated using Dragon voice recognition software. A reasonable attempt at proof reading has been made to minimize errors.

## 2022-09-10 ENCOUNTER — Ambulatory Visit: Payer: Medicare HMO

## 2022-09-12 ENCOUNTER — Other Ambulatory Visit (HOSPITAL_BASED_OUTPATIENT_CLINIC_OR_DEPARTMENT_OTHER): Payer: Self-pay | Admitting: Family Medicine

## 2022-09-12 DIAGNOSIS — I5032 Chronic diastolic (congestive) heart failure: Secondary | ICD-10-CM

## 2022-09-13 ENCOUNTER — Encounter (HOSPITAL_BASED_OUTPATIENT_CLINIC_OR_DEPARTMENT_OTHER): Payer: Self-pay | Admitting: Orthopaedic Surgery

## 2022-09-13 MED ORDER — DILTIAZEM HCL ER COATED BEADS 120 MG PO CP24: 120.0000 mg | ORAL_CAPSULE | Freq: Two times a day (BID) | ORAL | 0 refills | Status: DC

## 2022-09-14 ENCOUNTER — Other Ambulatory Visit (HOSPITAL_BASED_OUTPATIENT_CLINIC_OR_DEPARTMENT_OTHER): Payer: Self-pay | Admitting: Orthopaedic Surgery

## 2022-09-14 ENCOUNTER — Other Ambulatory Visit: Payer: Medicare HMO

## 2022-09-14 DIAGNOSIS — M75101 Unspecified rotator cuff tear or rupture of right shoulder, not specified as traumatic: Secondary | ICD-10-CM

## 2022-09-15 ENCOUNTER — Ambulatory Visit: Payer: Medicare HMO | Attending: Cardiology

## 2022-09-15 DIAGNOSIS — E7800 Pure hypercholesterolemia, unspecified: Secondary | ICD-10-CM

## 2022-09-15 DIAGNOSIS — I5032 Chronic diastolic (congestive) heart failure: Secondary | ICD-10-CM

## 2022-09-15 LAB — MAGNESIUM: Magnesium: 2.3 mg/dL (ref 1.6–2.3)

## 2022-09-15 LAB — LIPID PANEL
Chol/HDL Ratio: 3.5 ratio (ref 0.0–5.0)
Cholesterol, Total: 180 mg/dL (ref 100–199)
HDL: 52 mg/dL (ref >39)
LDL Chol Calc (NIH): 112 mg/dL — ABNORMAL HIGH (ref 0–99)
Triglycerides: 85 mg/dL (ref 0–149)
VLDL Cholesterol Cal: 16 mg/dL (ref 5–40)

## 2022-09-15 LAB — COMPREHENSIVE METABOLIC PANEL
ALT: 20 IU/L (ref 0–44)
AST: 17 IU/L (ref 0–40)
Albumin/Globulin Ratio: 2.5 — ABNORMAL HIGH (ref 1.2–2.2)
Albumin: 3.8 g/dL (ref 3.8–4.8)
Alkaline Phosphatase: 46 IU/L (ref 44–121)
BUN/Creatinine Ratio: 26 — ABNORMAL HIGH (ref 10–24)
BUN: 31 mg/dL — ABNORMAL HIGH (ref 8–27)
Bilirubin Total: 0.8 mg/dL (ref 0.0–1.2)
CO2: 25 mmol/L (ref 20–29)
Calcium: 9.2 mg/dL (ref 8.6–10.2)
Chloride: 102 mmol/L (ref 96–106)
Creatinine, Ser: 1.21 mg/dL (ref 0.76–1.27)
Globulin, Total: 1.5 g/dL (ref 1.5–4.5)
Glucose: 102 mg/dL — ABNORMAL HIGH (ref 70–99)
Potassium: 4.4 mmol/L (ref 3.5–5.2)
Sodium: 140 mmol/L (ref 134–144)
Total Protein: 5.3 g/dL — ABNORMAL LOW (ref 6.0–8.5)
eGFR: 62 mL/min/{1.73_m2} (ref >59)

## 2022-09-16 ENCOUNTER — Encounter (HOSPITAL_COMMUNITY): Payer: Self-pay | Admitting: *Deleted

## 2022-09-16 ENCOUNTER — Telehealth: Payer: Self-pay

## 2022-09-16 DIAGNOSIS — E7800 Pure hypercholesterolemia, unspecified: Secondary | ICD-10-CM

## 2022-09-16 NOTE — Telephone Encounter (Signed)
Orders placed for lipid clinic per Dr. Radford Pax.

## 2022-09-16 NOTE — Telephone Encounter (Signed)
Spoke to patient about change in his LDL level. Patient states he was taken off his crestor by NP at the afib clinic due to muscle and joint pain. He states he has been having a lot of mood swings lately and he is concerned his medications are causing this. He is particularly concerned about multaq as this was the most recent change to his regimen.  I advised him I would forward his concerns to his doctors. Discussed lipid clinic with patient and he states he would be willing to talk to pharmacist or practitioner about other cholesterol lowering medications that may be easier to tolerate.

## 2022-09-16 NOTE — Addendum Note (Signed)
Addended by: Joni Reining on: 09/16/2022 03:41 PM   Modules accepted: Orders

## 2022-09-16 NOTE — Telephone Encounter (Signed)
-----   Message from Victor Margarita, MD sent at 09/15/2022  5:48 PM EST ----- LDL is elevated please find out if he has been taking his Crestor and has missed any doses

## 2022-09-17 NOTE — Addendum Note (Signed)
Addended by: Kalem Rockwell E on: 09/17/2022 02:09 PM   Modules accepted: Orders

## 2022-09-17 NOTE — Telephone Encounter (Signed)
Discussed patient's concerns about mood swings being related to his multaq with afib clinic and pharm D. Called patient to encourage him to discuss his mood swings with his PCP as Pharm D has advised that multaq is unlikely cause of mood swings. No answer, left brief message per DPR.

## 2022-09-17 NOTE — Telephone Encounter (Signed)
Patient is returning your call. Please advise. ?

## 2022-09-21 NOTE — Telephone Encounter (Signed)
Returned patient's call to discuss questions about multaq. Reviewed pharmacist advice that multaq is unlikely cause of mood swings. Discussed that mood swings can be caused by medication intolerances and interactions, advised patient to pursue further with PCP as it may also be a sign of a depression needing treatment. Patient verbalizes understanding and agrees to plan.

## 2022-09-28 ENCOUNTER — Ambulatory Visit (HOSPITAL_COMMUNITY): Payer: Medicare HMO | Attending: Cardiology

## 2022-09-28 ENCOUNTER — Encounter: Payer: Self-pay | Admitting: Cardiology

## 2022-09-28 DIAGNOSIS — I509 Heart failure, unspecified: Secondary | ICD-10-CM | POA: Diagnosis not present

## 2022-09-28 DIAGNOSIS — I4510 Unspecified right bundle-branch block: Secondary | ICD-10-CM | POA: Insufficient documentation

## 2022-09-28 DIAGNOSIS — I4891 Unspecified atrial fibrillation: Secondary | ICD-10-CM | POA: Diagnosis not present

## 2022-09-28 DIAGNOSIS — I77810 Thoracic aortic ectasia: Secondary | ICD-10-CM

## 2022-09-28 DIAGNOSIS — I7121 Aneurysm of the ascending aorta, without rupture: Secondary | ICD-10-CM | POA: Insufficient documentation

## 2022-09-28 DIAGNOSIS — G4730 Sleep apnea, unspecified: Secondary | ICD-10-CM | POA: Diagnosis not present

## 2022-09-28 LAB — ECHOCARDIOGRAM COMPLETE
Area-P 1/2: 3.46 cm2
S' Lateral: 3.7 cm

## 2022-09-29 ENCOUNTER — Telehealth: Payer: Self-pay

## 2022-09-29 DIAGNOSIS — I77819 Aortic ectasia, unspecified site: Secondary | ICD-10-CM

## 2022-09-29 NOTE — Telephone Encounter (Signed)
Results reviewed with patient. Patient verbalized understanding of normal EF, mild heart thickening, increased stiffness of heart and changes to aorta. Agrees to repeat echo, order placed

## 2022-09-29 NOTE — Telephone Encounter (Signed)
-----   Message from Bernestine Amass, RN sent at 09/28/2022  2:31 PM EST -----  ----- Message ----- From: Sueanne Margarita, MD Sent: 09/28/2022   2:28 PM EST To: Raymond J de [place], MD; [facility] Triage  Echo showed normal heart function with EF 60 to 65% with mildly thickened heart muscle and increased stiffness of the heart muscle normal for age mildly calcified aortic valve with no aortic stenosis and borderline enlargement of the aortic root and ascending aorta at 40 and 91m respectively.  Repeat 2D echo in 1 year for dilated aorta

## 2022-10-01 DIAGNOSIS — M25511 Pain in right shoulder: Secondary | ICD-10-CM | POA: Diagnosis not present

## 2022-10-05 DIAGNOSIS — G4733 Obstructive sleep apnea (adult) (pediatric): Secondary | ICD-10-CM | POA: Diagnosis not present

## 2022-10-08 DIAGNOSIS — R29898 Other symptoms and signs involving the musculoskeletal system: Secondary | ICD-10-CM | POA: Diagnosis not present

## 2022-10-11 ENCOUNTER — Ambulatory Visit (HOSPITAL_BASED_OUTPATIENT_CLINIC_OR_DEPARTMENT_OTHER): Payer: Medicare HMO | Admitting: Physical Therapy

## 2022-10-13 ENCOUNTER — Other Ambulatory Visit (HOSPITAL_BASED_OUTPATIENT_CLINIC_OR_DEPARTMENT_OTHER): Payer: Self-pay | Admitting: Family Medicine

## 2022-10-13 DIAGNOSIS — E7800 Pure hypercholesterolemia, unspecified: Secondary | ICD-10-CM

## 2022-10-13 DIAGNOSIS — I10 Essential (primary) hypertension: Secondary | ICD-10-CM

## 2022-10-13 DIAGNOSIS — I5032 Chronic diastolic (congestive) heart failure: Secondary | ICD-10-CM

## 2022-10-15 ENCOUNTER — Telehealth: Payer: Self-pay

## 2022-10-15 ENCOUNTER — Encounter: Payer: Self-pay | Admitting: Cardiology

## 2022-10-15 NOTE — Telephone Encounter (Signed)
Adline Peals PA agreed continue all current medications at present dose. Will have pt follow up in 2-3 months in afib clinic. Recall placed.

## 2022-10-15 NOTE — Telephone Encounter (Signed)
Call to patient to review medications per his request over Mychart.  Patient states he is still being seen by afib clinic but that the metoprolol was originally prescribed by a Dr. Redmond Pulling and he isn't sure if he should still be on it.  He is concerned it may have been overlooked and left on his med list by accident. He states his BP and HR are good but he doesn't like taking so many medications. Explained that at his visit with Dr. Radford Pax on 09/03/22 both cardizem 120 mg daily and Lopressor 25 mg BID are listed. Reviewed mechanism of action and explained that both cardizem and metoprolol can help control afib. Patient verbalizes understanding. Forwarded to [employer] at [facility].

## 2022-10-20 DIAGNOSIS — G5601 Carpal tunnel syndrome, right upper limb: Secondary | ICD-10-CM | POA: Diagnosis not present

## 2022-10-25 DIAGNOSIS — M4722 Other spondylosis with radiculopathy, cervical region: Secondary | ICD-10-CM | POA: Diagnosis not present

## 2022-10-27 ENCOUNTER — Other Ambulatory Visit (HOSPITAL_BASED_OUTPATIENT_CLINIC_OR_DEPARTMENT_OTHER): Payer: Self-pay | Admitting: Family Medicine

## 2022-10-27 DIAGNOSIS — I5032 Chronic diastolic (congestive) heart failure: Secondary | ICD-10-CM

## 2022-10-28 ENCOUNTER — Ambulatory Visit: Payer: Medicare HMO

## 2022-11-03 DIAGNOSIS — G4733 Obstructive sleep apnea (adult) (pediatric): Secondary | ICD-10-CM | POA: Diagnosis not present

## 2022-11-04 ENCOUNTER — Other Ambulatory Visit: Payer: Self-pay | Admitting: Orthopedic Surgery

## 2022-11-04 ENCOUNTER — Ambulatory Visit (HOSPITAL_BASED_OUTPATIENT_CLINIC_OR_DEPARTMENT_OTHER): Payer: Medicare HMO | Admitting: Orthopaedic Surgery

## 2022-11-04 DIAGNOSIS — M25511 Pain in right shoulder: Secondary | ICD-10-CM

## 2022-11-04 DIAGNOSIS — M542 Cervicalgia: Secondary | ICD-10-CM

## 2022-11-04 MED ORDER — APIXABAN 5 MG PO TABS: 5.0000 mg | ORAL_TABLET | Freq: Two times a day (BID) | ORAL | 5 refills | Status: DC

## 2022-11-05 ENCOUNTER — Telehealth: Payer: Self-pay

## 2022-11-05 NOTE — Telephone Encounter (Signed)
**Note De-Identified Victor Medina Obfuscation** The pts completed BMSPAF application for Eliquis assistance was left at the office with documents (No OOP RX expenses were left).  I have completed the providers page of his application and have e-mailed all to the nurse working with Dr Radford Pax on Monday 04/01 so she can obtain her signature, date it, and to fax to [facility] at the fax number written on cover letter included.

## 2022-11-08 DIAGNOSIS — R69 Illness, unspecified: Secondary | ICD-10-CM | POA: Diagnosis not present

## 2022-11-08 NOTE — Telephone Encounter (Signed)
DOD Dr. Gasper Sells signed pt assistance application faxed has requested.  Confirmation is below.     Recipient at [id number] Subject:          Pt assistance Result:           The transmission was successful. Explanation:      All Pages Ok Pages Sent:       8 Connect Time:     9 minutes, 30 seconds Transmit Time:    11/08/2022 16:47 Transfer Rate:    14400 Status Code:      0000 Retry Count:      0 Job Id:           [id number] Unique [id number] Fax Line:         4 Fax Server:       [id number]

## 2022-11-10 ENCOUNTER — Telehealth: Payer: Self-pay

## 2022-11-10 ENCOUNTER — Telehealth: Payer: Self-pay | Admitting: Cardiology

## 2022-11-10 NOTE — Telephone Encounter (Signed)
Returned call to patient and left detailed message per DPR that I would forward his request to Dr Radford Pax and Dr Harrell Gave.

## 2022-11-10 NOTE — Telephone Encounter (Signed)
Patient is requesting to switch from Dr. Radford Pax to Dr. Harrell Gave. Please advise.

## 2022-11-10 NOTE — Telephone Encounter (Signed)
Called patient to discuss his concerns about his medications and his blood pressure as well as his concerns about knee, hip and shoulder pain. Patient states his blood pressure is fine and declines to give BP log at this time. He also states he has upcoming MRI's on 11/29/22 for his joint pain. He states his concern is that he is concerned some of his medications are making him feel bad and he wants to talk with someone about possibly stopping some to see if he feels better. He states he has talked with his practitioners at the afib clinic several times and they "have been no help" and he does not want to see them anymore. Patient scheduled to see PA on 12/01/22 to discuss medications.

## 2022-11-11 NOTE — Telephone Encounter (Signed)
Called patient to discuss changing from Dr. Radford Pax to Dr. Harrell Gave for cardiology, no answer, left detailed message per DPR to advise that he would still have to see Dr. Radford Pax for sleep apnea treatment but his cardiology appts could be changed to Dr. Harrell Gave.Asked patient to give [place] a call back to let us know if he'd like [place] to make those changes.

## 2022-11-12 NOTE — Telephone Encounter (Signed)
**Note De-Identified Victor Medina Obfuscation** Letter received [name] fax from [facility] stating that they have approved the pt for Eliquis assistance until 08/09/2023. [id number]  The letter states that they have notified the pt of this approvals well.

## 2022-11-12 NOTE — Telephone Encounter (Signed)
Spoke with patient to answer questions about providers and locations. He is agreeable to continue seeing Dr Mayford Knife at [address] location and Dr Cristal Deer at [facility].

## 2022-11-25 ENCOUNTER — Ambulatory Visit (HOSPITAL_BASED_OUTPATIENT_CLINIC_OR_DEPARTMENT_OTHER): Payer: Medicare HMO | Admitting: Family Medicine

## 2022-11-29 ENCOUNTER — Ambulatory Visit
Admission: RE | Admit: 2022-11-29 | Discharge: 2022-11-29 | Disposition: A | Discharge status: Home / Self Care | Payer: Medicare HMO | Source: Ambulatory Visit | Attending: Orthopedic Surgery | Admitting: Orthopedic Surgery

## 2022-11-29 DIAGNOSIS — R531 Weakness: Secondary | ICD-10-CM | POA: Diagnosis not present

## 2022-11-29 DIAGNOSIS — M542 Cervicalgia: Secondary | ICD-10-CM

## 2022-11-29 DIAGNOSIS — M4802 Spinal stenosis, cervical region: Secondary | ICD-10-CM | POA: Diagnosis not present

## 2022-11-29 DIAGNOSIS — M25511 Pain in right shoulder: Secondary | ICD-10-CM

## 2022-11-29 DIAGNOSIS — M75101 Unspecified rotator cuff tear or rupture of right shoulder, not specified as traumatic: Secondary | ICD-10-CM | POA: Diagnosis not present

## 2022-12-01 ENCOUNTER — Ambulatory Visit: Payer: Medicare HMO | Admitting: Physician Assistant

## 2022-12-04 DIAGNOSIS — G4733 Obstructive sleep apnea (adult) (pediatric): Secondary | ICD-10-CM | POA: Diagnosis not present

## 2022-12-06 DIAGNOSIS — M4722 Other spondylosis with radiculopathy, cervical region: Secondary | ICD-10-CM | POA: Diagnosis not present

## 2022-12-06 DIAGNOSIS — M67911 Unspecified disorder of synovium and tendon, right shoulder: Secondary | ICD-10-CM | POA: Diagnosis not present

## 2022-12-08 DIAGNOSIS — R69 Illness, unspecified: Secondary | ICD-10-CM | POA: Diagnosis not present

## 2022-12-13 DIAGNOSIS — M170 Bilateral primary osteoarthritis of knee: Secondary | ICD-10-CM | POA: Diagnosis not present

## 2022-12-13 DIAGNOSIS — M1711 Unilateral primary osteoarthritis, right knee: Secondary | ICD-10-CM | POA: Diagnosis not present

## 2022-12-13 DIAGNOSIS — M1712 Unilateral primary osteoarthritis, left knee: Secondary | ICD-10-CM | POA: Diagnosis not present

## 2022-12-17 ENCOUNTER — Encounter (HOSPITAL_BASED_OUTPATIENT_CLINIC_OR_DEPARTMENT_OTHER): Payer: Self-pay | Admitting: Orthopaedic Surgery

## 2022-12-17 DIAGNOSIS — M5412 Radiculopathy, cervical region: Secondary | ICD-10-CM | POA: Diagnosis not present

## 2022-12-21 ENCOUNTER — Encounter (HOSPITAL_BASED_OUTPATIENT_CLINIC_OR_DEPARTMENT_OTHER): Payer: Self-pay

## 2022-12-21 ENCOUNTER — Encounter (HOSPITAL_BASED_OUTPATIENT_CLINIC_OR_DEPARTMENT_OTHER): Payer: Medicare HMO

## 2022-12-28 ENCOUNTER — Ambulatory Visit (HOSPITAL_BASED_OUTPATIENT_CLINIC_OR_DEPARTMENT_OTHER): Payer: Medicare HMO | Admitting: Family Medicine

## 2022-12-28 ENCOUNTER — Other Ambulatory Visit: Payer: Self-pay | Admitting: Orthopedic Surgery

## 2022-12-31 NOTE — Pre-Procedure Instructions (Signed)
Surgical Instructions    Your procedure is scheduled on January 13, 2023.  Report to [facility] Main Entrance "A" at 5:30 A.M., then check in with the Admitting office.  Call this number if you have problems the morning of surgery:  [phone]  If you have any questions prior to your surgery date call [phone]: Open Monday-Friday 8am-4pm If you experience any cold or flu symptoms such as cough, fever, chills, shortness of breath, etc. between now and your scheduled surgery, please notify us at the above number.     Remember:  Do not eat after midnight the night before your surgery  You may drink clear liquids until 4:30 AM the morning of your surgery.   Clear liquids allowed are: Water, Non-Citrus Juices (without pulp), Carbonated Beverages, Clear Tea, Black Coffee Only (NO MILK, CREAM OR POWDERED CREAMER of any kind), and Gatorade.  Patient Instructions  The night before surgery:  No food after midnight. ONLY clear liquids after midnight  The day of surgery (if you do NOT have diabetes):  Drink ONE (1) Pre-Surgery Clear Ensure by 4:30 AM the morning of surgery. Drink in one sitting. Do not sip.  This drink was given to you during your hospital  pre-op appointment visit.  Nothing else to drink after completing the  Pre-Surgery Clear Ensure.         If you have questions, please contact your surgeon's office.     Take these medicines the morning of surgery with A SIP OF WATER:  diltiazem (CARDIZEM CD)   dronedarone (MULTAQ)   fluticasone-salmeterol (ADVAIR)   loratadine (CLARITIN)   metoprolol tartrate (LOPRESSOR)    May take these medicines IF NEEDED:  acetaminophen (TYLENOL)    Follow your surgeon's instructions on when to stop apixaban (ELIQUIS).  If no instructions were given by your surgeon then you will need to call the office to get those instructions.     As of today, STOP taking any Aspirin (unless otherwise instructed by your surgeon) Aleve, Naproxen,  Ibuprofen, Motrin, Advil, Goody's, BC's, all herbal medications, fish oil, and all vitamins.                     Do NOT Smoke (Tobacco/Vaping) for 24 hours prior to your procedure.  If you use a CPAP at night, you may bring your mask/headgear for your overnight stay.   Contacts, glasses, piercing's, hearing aid's, dentures or partials may not be worn into surgery, please bring cases for these belongings.    For patients admitted to the hospital, discharge time will be determined by your treatment team.   Patients discharged the day of surgery will not be allowed to drive home, and someone needs to stay with them for 24 hours.  SURGICAL WAITING ROOM VISITATION Patients having surgery or a procedure may have no more than 2 support people in the waiting area - these visitors may rotate.   Children under the age of 29 must have an adult with them who is not the patient. If the patient needs to stay at the hospital during part of their recovery, the visitor guidelines for inpatient rooms apply. Pre-op nurse will coordinate an appropriate time for 1 support person to accompany patient in pre-op.  This support person may not rotate.   Please refer to the [facility] website for the visitor guidelines for Inpatients (after your surgery is over and you are in a regular room).   If you received a COVID test during your pre-op  visit  it is requested that you wear a mask when out in public, stay away from anyone that may not be feeling well and notify your surgeon if you develop symptoms. If you have been in contact with anyone that has tested positive in the last 10 days please notify you surgeon.    Pre-operative 5 CHG Bath Instructions   You can play a key role in reducing the risk of infection after surgery. Your skin needs to be as free of germs as possible. You can reduce the number of germs on your skin by washing with CHG (chlorhexidine gluconate) soap before surgery. CHG is an antiseptic soap  that kills germs and continues to kill germs even after washing.   DO NOT use if you have an allergy to chlorhexidine/CHG or antibacterial soaps. If your skin becomes reddened or irritated, stop using the CHG and notify one of our RNs at [phone].   Please shower with the CHG soap starting 4 days before surgery using the following schedule:     Please keep in mind the following:  DO NOT shave, including legs and underarms, starting the day of your first shower.   You may shave your face at any point before/day of surgery.  Place clean sheets on your bed the day you start using CHG soap. Use a clean washcloth (not used since being washed) for each shower. DO NOT sleep with pets once you start using the CHG.   CHG Shower Instructions:  If you choose to wash your hair and private area, wash first with your normal shampoo/soap.  After you use shampoo/soap, rinse your hair and body thoroughly to remove shampoo/soap residue.  Turn the water OFF and apply about 3 tablespoons (45 ml) of CHG soap to a CLEAN washcloth.  Apply CHG soap ONLY FROM YOUR NECK DOWN TO YOUR TOES (washing for 3-5 minutes)  DO NOT use CHG soap on face, private areas, open wounds, or sores.  Pay special attention to the area where your surgery is being performed.  If you are having back surgery, having someone wash your back for you may be helpful. Wait 2 minutes after CHG soap is applied, then you may rinse off the CHG soap.  Pat dry with a clean towel  Put on clean clothes/pajamas   If you choose to wear lotion, please use ONLY the CHG-compatible lotions on the back of this paper.     Additional instructions for the day of surgery: DO NOT APPLY any lotions, deodorants, cologne, or perfumes.   Do not wear jewelry or makeup Do not wear nail polish, gel polish, artificial nails, or any other type of covering on natural nails (fingers and toes) Do not bring valuables to the hospital. [facility] is not responsible for  any belongings or valuables. Put on clean/comfortable clothes.  Brush your teeth.  Ask your nurse before applying any prescription medications to the skin.      CHG Compatible Lotions   Aveeno Moisturizing lotion  Cetaphil Moisturizing Cream  Cetaphil Moisturizing Lotion  Clairol Herbal Essence Moisturizing Lotion, Dry Skin  Clairol Herbal Essence Moisturizing Lotion, Extra Dry Skin  Clairol Herbal Essence Moisturizing Lotion, Normal Skin  Curel Age Defying Therapeutic Moisturizing Lotion with Alpha Hydroxy  Curel Extreme Care Body Lotion  Curel Soothing Hands Moisturizing Hand Lotion  Curel Therapeutic Moisturizing Cream, Fragrance-Free  Curel Therapeutic Moisturizing Lotion, Fragrance-Free  Curel Therapeutic Moisturizing Lotion, Original Formula  Eucerin Daily Replenishing Lotion  Eucerin Dry Skin Therapy Plus  Alpha Hydroxy Crme  Eucerin Dry Skin Therapy Plus Alpha Hydroxy Lotion  Eucerin Original Crme  Eucerin Original Lotion  Eucerin Plus Crme Eucerin Plus Lotion  Eucerin TriLipid Replenishing Lotion  Keri Anti-Bacterial Hand Lotion  Keri Deep Conditioning Original Lotion Dry Skin Formula Softly Scented  Keri Deep Conditioning Original Lotion, Fragrance Free Sensitive Skin Formula  Keri Lotion Fast Absorbing Fragrance Free Sensitive Skin Formula  Keri Lotion Fast Absorbing Softly Scented Dry Skin Formula  Keri Original Lotion  Keri Skin Renewal Lotion Keri Silky Smooth Lotion  Keri Silky Smooth Sensitive Skin Lotion  Nivea Body Creamy Conditioning Oil  Nivea Body Extra Enriched Lotion  Nivea Body Original Lotion  Nivea Body Sheer Moisturizing Lotion Nivea Crme  Nivea Skin Firming Lotion  NutraDerm 30 Skin Lotion  NutraDerm Skin Lotion  NutraDerm Therapeutic Skin Cream  NutraDerm Therapeutic Skin Lotion  ProShield Protective Hand Cream  Provon moisturizing lotion   Please read over the following fact sheets that you were given.

## 2023-01-03 DIAGNOSIS — G4733 Obstructive sleep apnea (adult) (pediatric): Secondary | ICD-10-CM | POA: Diagnosis not present

## 2023-01-04 ENCOUNTER — Telehealth: Payer: Self-pay | Admitting: *Deleted

## 2023-01-04 ENCOUNTER — Encounter (HOSPITAL_COMMUNITY): Payer: Self-pay

## 2023-01-04 ENCOUNTER — Encounter (HOSPITAL_COMMUNITY)
Admission: RE | Admit: 2023-01-04 | Discharge: 2023-01-04 | Disposition: A | Discharge status: Home / Self Care | Payer: Medicare HMO | Source: Ambulatory Visit | Attending: Orthopedic Surgery | Admitting: Orthopedic Surgery

## 2023-01-04 ENCOUNTER — Other Ambulatory Visit: Payer: Self-pay

## 2023-01-04 VITALS — BP 131/70 | HR 80 | Temp 98.0°F | Resp 19 | Ht 73.0 in | Wt 337.1 lb

## 2023-01-04 DIAGNOSIS — I480 Paroxysmal atrial fibrillation: Secondary | ICD-10-CM | POA: Insufficient documentation

## 2023-01-04 DIAGNOSIS — Z01812 Encounter for preprocedural laboratory examination: Secondary | ICD-10-CM | POA: Diagnosis not present

## 2023-01-04 DIAGNOSIS — K219 Gastro-esophageal reflux disease without esophagitis: Secondary | ICD-10-CM | POA: Diagnosis not present

## 2023-01-04 DIAGNOSIS — I2510 Atherosclerotic heart disease of native coronary artery without angina pectoris: Secondary | ICD-10-CM | POA: Diagnosis not present

## 2023-01-04 DIAGNOSIS — I110 Hypertensive heart disease with heart failure: Secondary | ICD-10-CM | POA: Diagnosis not present

## 2023-01-04 DIAGNOSIS — I5032 Chronic diastolic (congestive) heart failure: Secondary | ICD-10-CM | POA: Diagnosis not present

## 2023-01-04 DIAGNOSIS — Z01818 Encounter for other preprocedural examination: Secondary | ICD-10-CM

## 2023-01-04 DIAGNOSIS — Z87891 Personal history of nicotine dependence: Secondary | ICD-10-CM | POA: Insufficient documentation

## 2023-01-04 DIAGNOSIS — G9520 Unspecified cord compression: Secondary | ICD-10-CM | POA: Diagnosis not present

## 2023-01-04 HISTORY — DX: Essential (primary) hypertension: I10

## 2023-01-04 HISTORY — DX: Unspecified osteoarthritis, unspecified site: M19.90

## 2023-01-04 HISTORY — DX: Gastro-esophageal reflux disease without esophagitis: K21.9

## 2023-01-04 LAB — CBC
HCT: 48.3 % (ref 39.0–52.0)
Hemoglobin: 15.5 g/dL (ref 13.0–17.0)
MCH: 28.7 pg (ref 26.0–34.0)
MCHC: 32.1 g/dL (ref 30.0–36.0)
MCV: 89.4 fL (ref 80.0–100.0)
Platelets: 240 10*3/uL (ref 150–400)
RBC: 5.4 MIL/uL (ref 4.22–5.81)
RDW: 12.8 % (ref 11.5–15.5)
WBC: 5.5 10*3/uL (ref 4.0–10.5)
nRBC: 0 % (ref 0.0–0.2)

## 2023-01-04 LAB — SURGICAL PCR SCREEN
MRSA, PCR: NEGATIVE
Staphylococcus aureus: NEGATIVE

## 2023-01-04 NOTE — Telephone Encounter (Signed)
Pharmacy please advise on holding Eliquis prior to anterior cervical decompression and fusion scheduled for 01/13/2023. Thank you.

## 2023-01-04 NOTE — Telephone Encounter (Signed)
   Pre-operative Risk Assessment    Patient Name: Victor Medina  DOB: [birth year] MRN: [id number]      Request for Surgical Clearance    Procedure:   ANTERIOR CERVICAL DECOMPRESSION FUSION C 4-5, C5-6, WITH INSTRUMENTATION AND ALLOGRAFT  Date of Surgery:  Clearance 01/13/23 URGENT                                Surgeon:  DR. Yevette Edwards Surgeon's Group or Practice Name:  [facility] Phone number:  [phone] ATTN: Tobey Bride Fax number:  [phone]   Type of Clearance Requested:   - Medical  - Pharmacy:  Hold Apixaban (Eliquis)     Type of Anesthesia:  General    Additional requests/questions:    Elpidio Anis   01/04/2023, 5:13 PM

## 2023-01-04 NOTE — Progress Notes (Addendum)
PCP - Dr. Ceasar Mons [place] Cardiologist - Dr. Armanda Magic (Last office visit January 2024)  PPM/ICD - Denies Device Orders - n/a Rep Notified - n/a  Chest x-ray - 04/25/2022 EKG - 05/25/2022 Stress Test - 09/14/2012 ECHO - 09/28/2022 Cardiac Cath - Per pt, had one many years ago.   Sleep Study - Pt +OSA. Wears his CPAP nightly. He is not aware of his pressure settings because it does it automatically  No DM  Last dose of GLP1 agonist- n/a GLP1 instructions: n/a  Blood Thinner Instructions: Pt had not received instructions on stopping his Eliquis. Attempted to call surgeon's OR scheduler, but no answer. Voicemail left to contact pt with Eliquis hold instructions. Pt instructed to call surgeon's office by the end of the week if he has not received a call. Aspirin Instructions: n/a  ERAS Protcol - Clear liquids until 0430 morning of surgery PRE-SURGERY Ensure or G2- Ensure given to pt at pre-op appointment  COVID TEST- n/a   Anesthesia review: Yes. Cardiac hx including HF, HTN, A.Fib on Eliquis, OSA with BMI 44.47. Pt denies any new symptoms/changes in regards to his cardiac issues.  Patient denies shortness of breath, fever, cough and chest pain at PAT appointment. Pt denies any respiratory illness/infection in the last two months.   All instructions explained to the patient, with a verbal understanding of the material. Patient agrees to go over the instructions while at home for a better understanding. Patient also instructed to self quarantine after being tested for COVID-19. The opportunity to ask questions was provided.

## 2023-01-04 NOTE — Progress Notes (Signed)
BMP result from PAT appointment hemolyzed. Will need recollect DOS. Order placed.

## 2023-01-05 ENCOUNTER — Encounter (HOSPITAL_COMMUNITY): Payer: Self-pay

## 2023-01-05 ENCOUNTER — Ambulatory Visit: Payer: Medicare HMO | Attending: Nurse Practitioner

## 2023-01-05 DIAGNOSIS — Z01810 Encounter for preprocedural cardiovascular examination: Secondary | ICD-10-CM | POA: Diagnosis not present

## 2023-01-05 NOTE — Progress Notes (Signed)
Case: [id number] Date/Time: 01/13/23 0715   Procedure: CERVICAL 4- CERVICAL 5, CERVICAL 5- CERVICAL 6 ANTERIOR CERVICAL DECOMPRESSION FUSION WITH INSTRUMENTATION AND ALLOGRAFT   Anesthesia type: General   Pre-op diagnosis: spinal cord compression   [facility] ROOM 05 / [facility] OR   Surgeons: Estill Bamberg, MD       DISCUSSION: Victor Medina is a 77 year old male who presents to PAT prior to surgery listed above.  Patient is a former smoker with history of hypertension, dilated aortic root, paroxysmal A-fib status post cardioversion in May 2023, CHF, obesity, asthma, sleep apnea (on CPAP), GERD.  No prior anesthesia complications.  Patient last followed up with cardiology in clinic on 09/03/2022 with Dr. Mayford Knife.  He had been stable at that visit and telephone visit was done on 5/29 for preoperative risk assessment and clearance and he reported feeling well.  "Preoperative Cardiovascular Risk Assessment:   Patient's RCRI score is 6.6%   The patient affirms he has been doing well without any new cardiac symptoms. They are able to achieve 5 METS without cardiac limitations. Therefore, based on ACC/AHA guidelines, the patient would be at acceptable risk for the planned procedure without further cardiovascular testing. The patient was advised that if he develops new symptoms prior to surgery to contact our office to arrange for a follow-up visit, and he verbalized understanding.    The patient was advised that if he develops new symptoms prior to surgery to contact our office to arrange for a follow-up visit, and he verbalized understanding.   Per office protocol, patient can hold Eliquis for 3 days prior to procedure."  Patient has history of dilated aortic root and last had an echocardiogram on 09/28/2022.  He was advised to have a repeat echo in 1 year.  EF was normal.  Patient follows with his PCP for other chronic medical issues. BP has been controlled on current regimen.  VS: BP 131/70    Pulse 80   Temp 36.7 C   Resp 19   Ht 6\' 1"  (1.854 m)   Wt (!) 152.9 kg   SpO2 98%   BMI 44.47 kg/m   PROVIDERS: de [place], Buren Kos, MD   LABS: Labs reviewed: Acceptable for surgery. (all labs ordered are listed, but only abnormal results are displayed)  Labs Reviewed  SURGICAL PCR SCREEN  CBC     IMAGES:  MRI C-spine 11/29/22:  IMPRESSION: 1. C5-C6 moderate spinal canal stenosis with moderate to severe right and mild-to-moderate left neural foraminal narrowing. 2. C4-C5 mild spinal canal stenosis with moderate right and mild left neural foraminal narrowing. 3. C6-C7 mild spinal canal stenosis with mild-to-moderate left and mild right neural foraminal narrowing.   EKG 05/25/22:  Sinus Rhythm with PVCs LAD RBBB Minimal voltage criteria for LVH, may be normal variant Appears similar to prior  CV:  Echo 09/28/22:  IMPRESSIONS     1. Left ventricular ejection fraction, by estimation, is 60 to 65%. The  left ventricle has normal function. The left ventricle has no regional  wall motion abnormalities. There is mild concentric left ventricular  hypertrophy. Left ventricular diastolic  parameters are consistent with Grade I diastolic dysfunction (impaired  relaxation).   2. Right ventricular systolic function is normal. The right ventricular  size is normal. There is normal pulmonary artery systolic pressure.   3. The mitral valve is normal in structure. No evidence of mitral valve  regurgitation. No evidence of mitral stenosis.   4. The aortic valve is tricuspid. There  is mild calcification of the  aortic valve. Aortic valve regurgitation is not visualized. No aortic  stenosis is present.   5. Aortic dilatation noted. There is mild dilatation of the aortic root,  measuring 40 mm. There is mild dilatation of the ascending aorta,  measuring 41 mm.   6. The inferior vena cava is normal in size with greater than 50%  respiratory variability, suggesting right  atrial pressure of 3 mmHg.  Past Medical History:  Diagnosis Date   Acquired dilation of ascending aorta and aortic root (HCC)    41 mm and 40 mm respectively   Arthritis    Asthma    Cancer (HCC)    mild skin   Chronic diastolic CHF (congestive heart failure) (HCC)    GERD (gastroesophageal reflux disease)    Hypertension    Morbid obesity (HCC)    OSA (obstructive sleep apnea)    PAF (paroxysmal atrial fibrillation) (HCC) 08/14/2015   s/p TEE/DCCV to NSR;  denies palplita    RBBB    atrial fib on cardizem    Past Surgical History:  Procedure Laterality Date   CARDIOVERSION N/A 05/19/2015   Procedure: CARDIOVERSION;  Surgeon: Pricilla Riffle, MD;  Location: [facility] ENDOSCOPY;  Service: Cardiovascular;  Laterality: N/A;   CARDIOVERSION N/A 12/17/2021   Procedure: CARDIOVERSION;  Surgeon: Pricilla Riffle, MD;  Location: [facility] ENDOSCOPY;  Service: Cardiovascular;  Laterality: N/A;   CATARACT EXTRACTION W/ INTRAOCULAR LENS IMPLANT Right    CHOLECYSTECTOMY N/A 12/01/2012   Procedure: LAPAROSCOPIC CHOLECYSTECTOMY;  Surgeon: Cherylynn Ridges, MD;  Location: [facility] OR;  Service: General;  Laterality: N/A;   COLONOSCOPY WITH PROPOFOL N/A 07/13/2019   Procedure: COLONOSCOPY WITH PROPOFOL;  Surgeon: Jeani Hawking, MD;  Location: [facility] ENDOSCOPY;  Service: Endoscopy;  Laterality: N/A;   MASS EXCISION Right 02/20/2018   Procedure: RIGHT THUMB EXCISION MASS AND DEBRIDEMENT DISTAL INTRPHLANGEAL JOINT;  Surgeon: Betha Loa, MD;  Location: [facility];  Service: Orthopedics;  Laterality: Right;   POLYPECTOMY  07/13/2019   Procedure: POLYPECTOMY;  Surgeon: Jeani Hawking, MD;  Location: [facility] ENDOSCOPY;  Service: Endoscopy;;   right knee     meniscus removal 6 years ago   SHOULDER SURGERY Left    Rotator Cuff Repair   TEE WITHOUT CARDIOVERSION N/A 05/19/2015   Procedure: TRANSESOPHAGEAL ECHOCARDIOGRAM (TEE);  Surgeon: Pricilla Riffle, MD;  Location: [facility] ENDOSCOPY;  Service: Cardiovascular;  Laterality: N/A;     MEDICATIONS:  acetaminophen (TYLENOL) 500 MG tablet   apixaban (ELIQUIS) 5 MG TABS tablet   APPLE CIDER VINEGAR PO   Cholecalciferol (VITAMIN D3) 2000 UNITS TABS   diltiazem (CARDIZEM CD) 120 MG 24 hr capsule   dronedarone (MULTAQ) 400 MG tablet   fluticasone (FLONASE) 50 MCG/ACT nasal spray   fluticasone-salmeterol (ADVAIR) 100-50 MCG/ACT AEPB   furosemide (LASIX) 20 MG tablet   hydrochlorothiazide (HYDRODIURIL) 25 MG tablet   loratadine (CLARITIN) 10 MG tablet   Magnesium 250 MG TABS   metoprolol tartrate (LOPRESSOR) 25 MG tablet   Multiple Vitamins-Minerals (MULTIVITAMIN ADULT) CHEW   Omega-3 Fatty Acids (FISH OIL) 1000 MG CAPS   tamsulosin (FLOMAX) 0.4 MG CAPS capsule   trolamine salicylate (ASPERCREME) 10 % cream   No current facility-administered medications for this encounter.   Marcille Blanco [facility] Surgical Short Stay/Anesthesiology [facility] Phone [phone] 01/05/2023 4:11 PM

## 2023-01-05 NOTE — Telephone Encounter (Addendum)
I was able to reach the pt and confirm the tele appt for today with the pt @ 1:40. Consent given, though meds not reviewed as pt was busy. I will update all parties involved.

## 2023-01-05 NOTE — Anesthesia Preprocedure Evaluation (Addendum)
Anesthesia Evaluation  Patient identified by MRN, date of birth, ID band Patient awake    Reviewed: Allergy & Precautions, H&P , NPO status , Patient's Chart, lab work & pertinent test results  Airway Mallampati: II   Neck ROM: full    Dental   Pulmonary asthma , sleep apnea , former smoker   breath sounds clear to auscultation       Cardiovascular hypertension, +CHF  + dysrhythmias Atrial Fibrillation  Rhythm:regular Rate:Normal     Neuro/Psych    GI/Hepatic ,GERD  ,,  Endo/Other    Morbid obesity  Renal/GU      Musculoskeletal  (+) Arthritis ,    Abdominal   Peds  Hematology   Anesthesia Other Findings   Reproductive/Obstetrics                             Anesthesia Physical Anesthesia Plan  ASA: 3  Anesthesia Plan: General   Post-op Pain Management:    Induction: Intravenous  PONV Risk Score and Plan: 2 and Ondansetron, Dexamethasone and Treatment may vary due to age or medical condition  Airway Management Planned: Oral ETT and Video Laryngoscope Planned  Additional Equipment:   Intra-op Plan:   Post-operative Plan: Extubation in OR  Informed Consent: I have reviewed the patients History and Physical, chart, labs and discussed the procedure including the risks, benefits and alternatives for the proposed anesthesia with the patient or authorized representative who has indicated his/her understanding and acceptance.     Dental advisory given  Plan Discussed with: CRNA, Anesthesiologist and Surgeon  Anesthesia Plan Comments: (See PAT note from 5/28 by Sherlie Ban PA-C )        Anesthesia Quick Evaluation

## 2023-01-05 NOTE — Progress Notes (Signed)
Virtual Visit via Telephone Note   Because of Victor Medina's co-morbid illnesses, he is at least at moderate risk for complications without adequate follow up.  This format is felt to be most appropriate for this patient at this time.  The patient did not have access to video technology/had technical difficulties with video requiring transitioning to audio format only (telephone).  All issues noted in this document were discussed and addressed.  No physical exam could be performed with this format.  Please refer to the patient's chart for his consent to telehealth for [facility].  Evaluation Performed:  Preoperative cardiovascular risk assessment _____________   Date:  01/05/2023   Patient ID:  Victor Medina, DOB 1946/05/14, MRN [id number] Patient Location:  Home Provider location:   Office  Primary Care Provider:  [place], Buren Kos, MD Primary Cardiologist:  Armanda Magic, MD  Chief Complaint / Patient Profile   77 y.o. y/o male with a h/o PAF s/p cardioversion in 2016, mildly dilated aortic root, chronic diastolic CHF, chonic LE edema, RBBB, OSA on CPAP.  who is pending anterior cervical decompression and presents today for telephonic preoperative cardiovascular risk assessment.  History of Present Illness    Victor Medina is a 77 y.o. male who presents via audio/video conferencing for a telehealth visit today.  Pt was last seen in cardiology clinic on 09/03/2022 by Dr. Mayford Knife.  At that time ANVAY ARAKELIAN was doing well with no complaints of chest pain and blood pressure well-controlled.  He reported going to the gym 3 times weekly. The patient is now pending procedure as outlined above. Since his last visit, he has been doing well with no new cardiac complaints.  He is continuing to go to the gym 3 times per week and workout.  This has been hindered somewhat with chronic knee pain.  He denies chest pain, shortness of breath, lower extremity edema, fatigue,  palpitations, melena, hematuria, hemoptysis, diaphoresis, weakness, presyncope, syncope, orthopnea, and PND.     Past Medical History    Past Medical History:  Diagnosis Date   Acquired dilation of ascending aorta and aortic root (HCC)    41 mm and 40 mm respectively   Arthritis    Asthma    Cancer (HCC)    mild skin   Chronic diastolic CHF (congestive heart failure) (HCC)    GERD (gastroesophageal reflux disease)    Hypertension    Morbid obesity (HCC)    OSA (obstructive sleep apnea)    PAF (paroxysmal atrial fibrillation) (HCC) 08/14/2015   s/p TEE/DCCV to NSR;  denies palplita    RBBB    atrial fib on cardizem   Past Surgical History:  Procedure Laterality Date   CARDIOVERSION N/A 05/19/2015   Procedure: CARDIOVERSION;  Surgeon: Pricilla Riffle, MD;  Location: [facility] ENDOSCOPY;  Service: Cardiovascular;  Laterality: N/A;   CARDIOVERSION N/A 12/17/2021   Procedure: CARDIOVERSION;  Surgeon: Pricilla Riffle, MD;  Location: [facility] ENDOSCOPY;  Service: Cardiovascular;  Laterality: N/A;   CATARACT EXTRACTION W/ INTRAOCULAR LENS IMPLANT Right    CHOLECYSTECTOMY N/A 12/01/2012   Procedure: LAPAROSCOPIC CHOLECYSTECTOMY;  Surgeon: Cherylynn Ridges, MD;  Location: [facility] OR;  Service: General;  Laterality: N/A;   COLONOSCOPY WITH PROPOFOL N/A 07/13/2019   Procedure: COLONOSCOPY WITH PROPOFOL;  Surgeon: Jeani Hawking, MD;  Location: [facility] ENDOSCOPY;  Service: Endoscopy;  Laterality: N/A;   MASS EXCISION Right 02/20/2018   Procedure: RIGHT THUMB EXCISION MASS AND DEBRIDEMENT DISTAL INTRPHLANGEAL JOINT;  Surgeon: Betha Loa, MD;  Location: [facility];  Service: Orthopedics;  Laterality: Right;   POLYPECTOMY  07/13/2019   Procedure: POLYPECTOMY;  Surgeon: Jeani Hawking, MD;  Location: [facility] ENDOSCOPY;  Service: Endoscopy;;   right knee     meniscus removal 6 years ago   SHOULDER SURGERY Left    Rotator Cuff Repair   TEE WITHOUT CARDIOVERSION N/A 05/19/2015   Procedure: TRANSESOPHAGEAL  ECHOCARDIOGRAM (TEE);  Surgeon: Pricilla Riffle, MD;  Location: [facility] ENDOSCOPY;  Service: Cardiovascular;  Laterality: N/A;    Allergies  Allergies  Allergen Reactions   Lipitor [Atorvastatin] Other (See Comments)    Painful joints     Home Medications    Prior to Admission medications   Medication Sig Start Date End Date Taking? Authorizing Provider  acetaminophen (TYLENOL) 500 MG tablet Take 1,000 mg by mouth every 6 (six) hours as needed for moderate pain.    [provider]  apixaban (ELIQUIS) 5 MG TABS tablet Take 1 tablet (5 mg total) by mouth 2 (two) times daily. 11/04/22   Quintella Reichert, MD  APPLE CIDER VINEGAR PO Take 2 capsules by mouth daily.    [provider]  Cholecalciferol (VITAMIN D3) 2000 UNITS TABS Take 4,000 Units by mouth daily.     [provider]  diltiazem (CARDIZEM CD) 120 MG 24 hr capsule Take 1 capsule (120 mg total) by mouth 2 (two) times daily. 09/13/22   Novella Olive, FNP  dronedarone (MULTAQ) 400 MG tablet Take 1 tablet (400 mg total) by mouth 2 (two) times daily with a meal. 06/11/22   Newman Nip, NP  fluticasone (FLONASE) 50 MCG/ACT nasal spray Place 1 spray into both nostrils at bedtime.    [provider]  fluticasone-salmeterol (ADVAIR) 100-50 MCG/ACT AEPB Inhale 1 puff into the lungs 2 (two) times daily. 06/25/22   de [place], Raymond J, MD  furosemide (LASIX) 20 MG tablet Take 1 tablet (20 mg total) by mouth daily. Take 2 tabs daily for 3 days, then reduce dose to one tablet daily. Patient not taking: Reported on 12/30/2022 09/03/22   Quintella Reichert, MD  hydrochlorothiazide (HYDRODIURIL) 25 MG tablet Take 25 mg by mouth daily.    [provider]  loratadine (CLARITIN) 10 MG tablet Take 10 mg by mouth daily.    [provider]  Magnesium 250 MG TABS Take 250 mg by mouth 2 (two) times daily.    [provider]  metoprolol tartrate (LOPRESSOR) 25 MG tablet Take 1 tablet (25 mg total) by mouth 2  (two) times daily. Patient taking differently: Take 37.5 mg by mouth 2 (two) times daily. 07/26/22   de [place], Raymond J, MD  Multiple Vitamins-Minerals (MULTIVITAMIN ADULT) CHEW Chew 2 each by mouth daily.    [provider]  Omega-3 Fatty Acids (FISH OIL) 1000 MG CAPS Take 2,000 mg by mouth daily.    [provider]  tamsulosin (FLOMAX) 0.4 MG CAPS capsule TAKE 1 CAPSULE BY MOUTH EVERYDAY AT BEDTIME 10/14/22   [place], Buren Kos, MD  trolamine salicylate (ASPERCREME) 10 % cream Apply 1 application topically as needed for muscle pain.    [provider]    Physical Exam    Vital Signs:  JANSIEL WILKE does not have vital signs available for review today.  Given telephonic nature of communication, physical exam is limited. AAOx3. NAD. Normal affect.  Speech and respirations are unlabored.  Accessory Clinical Findings    None  Assessment & Plan    1.  Preoperative Cardiovascular Risk Assessment:  Patient's RCRI score is 6.6%  The patient affirms he has been doing well without any new cardiac symptoms. They are able to achieve 5 METS without cardiac limitations. Therefore, based on ACC/AHA guidelines, the patient would be at acceptable risk for the planned procedure without further cardiovascular testing. The patient was advised that if he develops new symptoms prior to surgery to contact our office to arrange for a follow-up visit, and he verbalized understanding.   The patient was advised that if he develops new symptoms prior to surgery to contact our office to arrange for a follow-up visit, and he verbalized understanding.  Per office protocol, patient can hold Eliquis for 3 days prior to procedure.   A copy of this note will be routed to requesting surgeon.  Time:   Today, I have spent 7 minutes with the patient with telehealth technology discussing medical history, symptoms, and management plan.     Napoleon Form, Leodis Rains, NP  01/05/2023, 8:45 AM

## 2023-01-05 NOTE — Telephone Encounter (Signed)
I left a message for the pt to call back as he is going to need to sched a tele appt. Our first available tele appt is 01/11/23, though pt needs to hold blood thinner x 3 which would require him to begin holding blood thinner as of 01/10/23 with the last dose on 01/09/23. Our office just received the request yesterday.

## 2023-01-05 NOTE — Telephone Encounter (Signed)
   Name: Victor Medina  DOB: Mar 27, 1946  MRN: [id number]  Primary Cardiologist: Armanda Magic, MD   Preoperative team, please contact this patient and set up a phone call appointment for further preoperative risk assessment. Please obtain consent and complete medication review. Thank you for your help.  I confirm that guidance regarding antiplatelet and oral anticoagulation therapy has been completed and, if necessary, noted below.  Per office protocol, patient can hold Eliquis for 3 days prior to procedure.    Napoleon Form, Leodis Rains, NP 01/05/2023, 7:38 AM [facility]

## 2023-01-05 NOTE — Telephone Encounter (Signed)
Left message to let the pt know that we will add him on today for pre op clearance, see notes. I have held 1:40 time slot for the pt. I need pt to call back and confirm.

## 2023-01-05 NOTE — Telephone Encounter (Signed)
Patient with diagnosis of afib on Eliquis for anticoagulation.    Procedure: ANTERIOR CERVICAL DECOMPRESSION FUSION C 4-5, C5-6, WITH INSTRUMENTATION AND ALLOGRAFT  Date of procedure: 01/13/23   CHA2DS2-VASc Score = 4   This indicates a 4.8% annual risk of stroke. The patient's score is based upon: CHF History: 1 HTN History: 1 Diabetes History: 0 Stroke History: 0 Vascular Disease History: 0 Age Score: 2 Gender Score: 0      CrCl 77.9  Per office protocol, patient can hold Eliquis for 3 days prior to procedure.    **This guidance is not considered finalized until pre-operative APP has relayed final recommendations.**

## 2023-01-05 NOTE — Telephone Encounter (Signed)
D/w the pre op APP today Robin Searing, NP who states the pt will need to be added on today to the pre op sched due to surgery date and med hold needed.Marland Kitchen

## 2023-01-08 DIAGNOSIS — R69 Illness, unspecified: Secondary | ICD-10-CM | POA: Diagnosis not present

## 2023-01-13 ENCOUNTER — Ambulatory Visit (HOSPITAL_COMMUNITY): Payer: Medicare HMO

## 2023-01-13 ENCOUNTER — Other Ambulatory Visit (HOSPITAL_BASED_OUTPATIENT_CLINIC_OR_DEPARTMENT_OTHER): Payer: Self-pay | Admitting: Family Medicine

## 2023-01-13 ENCOUNTER — Ambulatory Visit (HOSPITAL_COMMUNITY): Payer: Medicare HMO | Admitting: Medical

## 2023-01-13 ENCOUNTER — Ambulatory Visit (HOSPITAL_COMMUNITY)
Admission: RE | Admit: 2023-01-13 | Discharge: 2023-01-13 | Disposition: A | Discharge status: Home / Self Care | Payer: Medicare HMO | Attending: Orthopedic Surgery | Admitting: Orthopedic Surgery

## 2023-01-13 ENCOUNTER — Other Ambulatory Visit: Payer: Self-pay

## 2023-01-13 ENCOUNTER — Ambulatory Visit (HOSPITAL_BASED_OUTPATIENT_CLINIC_OR_DEPARTMENT_OTHER): Payer: Medicare HMO | Admitting: Certified Registered"

## 2023-01-13 ENCOUNTER — Encounter (HOSPITAL_COMMUNITY)
Admission: RE | Disposition: A | Discharge status: Home / Self Care | Payer: Self-pay | Source: Home / Self Care | Attending: Orthopedic Surgery

## 2023-01-13 DIAGNOSIS — G9520 Unspecified cord compression: Secondary | ICD-10-CM

## 2023-01-13 DIAGNOSIS — J45909 Unspecified asthma, uncomplicated: Secondary | ICD-10-CM | POA: Diagnosis not present

## 2023-01-13 DIAGNOSIS — G4733 Obstructive sleep apnea (adult) (pediatric): Secondary | ICD-10-CM | POA: Insufficient documentation

## 2023-01-13 DIAGNOSIS — M4802 Spinal stenosis, cervical region: Secondary | ICD-10-CM | POA: Insufficient documentation

## 2023-01-13 DIAGNOSIS — I480 Paroxysmal atrial fibrillation: Secondary | ICD-10-CM | POA: Diagnosis not present

## 2023-01-13 DIAGNOSIS — I5032 Chronic diastolic (congestive) heart failure: Secondary | ICD-10-CM | POA: Diagnosis not present

## 2023-01-13 DIAGNOSIS — Z87891 Personal history of nicotine dependence: Secondary | ICD-10-CM | POA: Diagnosis not present

## 2023-01-13 DIAGNOSIS — M5412 Radiculopathy, cervical region: Secondary | ICD-10-CM | POA: Insufficient documentation

## 2023-01-13 DIAGNOSIS — Z981 Arthrodesis status: Secondary | ICD-10-CM | POA: Diagnosis not present

## 2023-01-13 DIAGNOSIS — I509 Heart failure, unspecified: Secondary | ICD-10-CM

## 2023-01-13 DIAGNOSIS — M4322 Fusion of spine, cervical region: Secondary | ICD-10-CM | POA: Diagnosis not present

## 2023-01-13 DIAGNOSIS — I110 Hypertensive heart disease with heart failure: Secondary | ICD-10-CM | POA: Diagnosis not present

## 2023-01-13 DIAGNOSIS — Z6841 Body Mass Index (BMI) 40.0 and over, adult: Secondary | ICD-10-CM | POA: Insufficient documentation

## 2023-01-13 DIAGNOSIS — M5010 Cervical disc disorder with radiculopathy, unspecified cervical region: Secondary | ICD-10-CM

## 2023-01-13 DIAGNOSIS — E7800 Pure hypercholesterolemia, unspecified: Secondary | ICD-10-CM

## 2023-01-13 DIAGNOSIS — I4891 Unspecified atrial fibrillation: Secondary | ICD-10-CM | POA: Diagnosis not present

## 2023-01-13 DIAGNOSIS — Z01818 Encounter for other preprocedural examination: Secondary | ICD-10-CM

## 2023-01-13 DIAGNOSIS — I10 Essential (primary) hypertension: Secondary | ICD-10-CM

## 2023-01-13 HISTORY — PX: ANTERIOR CERVICAL DECOMP/DISCECTOMY FUSION: SHX1161

## 2023-01-13 LAB — BASIC METABOLIC PANEL
Anion gap: 10 (ref 5–15)
BUN: 19 mg/dL (ref 8–23)
CO2: 26 mmol/L (ref 22–32)
Calcium: 9 mg/dL (ref 8.9–10.3)
Chloride: 101 mmol/L (ref 98–111)
Creatinine, Ser: 1.2 mg/dL (ref 0.61–1.24)
GFR, Estimated: >60 mL/min (ref >60)
Glucose, Bld: 103 mg/dL — ABNORMAL HIGH (ref 70–99)
Potassium: 3.3 mmol/L — ABNORMAL LOW (ref 3.5–5.1)
Sodium: 137 mmol/L (ref 135–145)

## 2023-01-13 SURGERY — ANTERIOR CERVICAL DECOMPRESSION/DISCECTOMY FUSION 2 LEVELS
Anesthesia: General

## 2023-01-13 MED ORDER — HYDROCODONE-ACETAMINOPHEN 5-325 MG PO TABS: 1.0000 | ORAL_TABLET | Freq: Four times a day (QID) | ORAL | 0 refills | Status: DC | PRN

## 2023-01-13 MED ORDER — THROMBIN (RECOMBINANT) 20000 UNITS EX SOLR
CUTANEOUS | Status: AC
  Filled 2023-01-13: qty 20000

## 2023-01-13 MED ORDER — ORAL CARE MOUTH RINSE: 15.0000 mL | Freq: Once | OROMUCOSAL | Status: AC

## 2023-01-13 MED ORDER — DEXAMETHASONE SODIUM PHOSPHATE 10 MG/ML IJ SOLN
INTRAMUSCULAR | Status: AC
  Filled 2023-01-13: qty 1

## 2023-01-13 MED ORDER — DEXAMETHASONE SODIUM PHOSPHATE 10 MG/ML IJ SOLN
INTRAMUSCULAR | Status: DC | PRN
  Administered 2023-01-13: 10 mg via INTRAVENOUS

## 2023-01-13 MED ORDER — 0.9 % SODIUM CHLORIDE (POUR BTL) OPTIME
TOPICAL | Status: DC | PRN
  Administered 2023-01-13 (×2): 1000 mL

## 2023-01-13 MED ORDER — PROPOFOL 10 MG/ML IV BOLUS
INTRAVENOUS | Status: AC
  Filled 2023-01-13: qty 20

## 2023-01-13 MED ORDER — CHLORHEXIDINE GLUCONATE 0.12 % MT SOLN
15.0000 mL | Freq: Once | OROMUCOSAL | Status: AC
  Administered 2023-01-13: 15 mL via OROMUCOSAL
  Filled 2023-01-13: qty 15

## 2023-01-13 MED ORDER — OXYCODONE HCL 5 MG PO TABS
5.0000 mg | ORAL_TABLET | Freq: Once | ORAL | Status: AC | PRN
  Administered 2023-01-13: 5 mg via ORAL

## 2023-01-13 MED ORDER — ONDANSETRON HCL 4 MG/2ML IJ SOLN
INTRAMUSCULAR | Status: AC
  Filled 2023-01-13: qty 4

## 2023-01-13 MED ORDER — PHENYLEPHRINE HCL-NACL 20-0.9 MG/250ML-% IV SOLN
INTRAVENOUS | Status: DC | PRN
  Administered 2023-01-13: 20 ug/min via INTRAVENOUS

## 2023-01-13 MED ORDER — ROCURONIUM BROMIDE 10 MG/ML (PF) SYRINGE
PREFILLED_SYRINGE | INTRAVENOUS | Status: DC | PRN
  Administered 2023-01-13: 70 mg via INTRAVENOUS
  Administered 2023-01-13: 30 mg via INTRAVENOUS
  Administered 2023-01-13: 20 mg via INTRAVENOUS
  Administered 2023-01-13: 10 mg via INTRAVENOUS

## 2023-01-13 MED ORDER — LIDOCAINE 2% (20 MG/ML) 5 ML SYRINGE
INTRAMUSCULAR | Status: AC
  Filled 2023-01-13: qty 5

## 2023-01-13 MED ORDER — ONDANSETRON HCL 4 MG/2ML IJ SOLN: 4.0000 mg | Freq: Four times a day (QID) | INTRAMUSCULAR | Status: DC | PRN

## 2023-01-13 MED ORDER — HEMOSTATIC AGENTS (NO CHARGE) OPTIME
TOPICAL | Status: DC | PRN
  Administered 2023-01-13: 1 via TOPICAL

## 2023-01-13 MED ORDER — BUPIVACAINE-EPINEPHRINE 0.25% -1:200000 IJ SOLN
INTRAMUSCULAR | Status: DC | PRN
  Administered 2023-01-13: 5 mL

## 2023-01-13 MED ORDER — ONDANSETRON HCL 4 MG/2ML IJ SOLN
INTRAMUSCULAR | Status: DC | PRN
  Administered 2023-01-13: 4 mg via INTRAVENOUS

## 2023-01-13 MED ORDER — THROMBIN (RECOMBINANT) 20000 UNITS EX SOLR
CUTANEOUS | Status: DC | PRN
  Administered 2023-01-13: 20000 [IU] via TOPICAL

## 2023-01-13 MED ORDER — LACTATED RINGERS IV SOLN: INTRAVENOUS | Status: DC

## 2023-01-13 MED ORDER — POVIDONE-IODINE 7.5 % EX SOLN
Freq: Once | CUTANEOUS | Status: AC
  Filled 2023-01-13: qty 118

## 2023-01-13 MED ORDER — FENTANYL CITRATE (PF) 100 MCG/2ML IJ SOLN
25.0000 ug | INTRAMUSCULAR | Status: DC | PRN
  Administered 2023-01-13 (×2): 50 ug via INTRAVENOUS

## 2023-01-13 MED ORDER — PROPOFOL 10 MG/ML IV BOLUS
INTRAVENOUS | Status: DC | PRN
  Administered 2023-01-13: 170 mg via INTRAVENOUS

## 2023-01-13 MED ORDER — OXYCODONE HCL 5 MG PO TABS
ORAL_TABLET | ORAL | Status: AC
  Filled 2023-01-13: qty 1

## 2023-01-13 MED ORDER — LIDOCAINE 2% (20 MG/ML) 5 ML SYRINGE
INTRAMUSCULAR | Status: DC | PRN
  Administered 2023-01-13: 60 mg via INTRAVENOUS

## 2023-01-13 MED ORDER — MIDAZOLAM HCL 2 MG/2ML IJ SOLN
INTRAMUSCULAR | Status: AC
  Filled 2023-01-13: qty 2

## 2023-01-13 MED ORDER — METHOCARBAMOL 750 MG PO TABS: 750.0000 mg | ORAL_TABLET | Freq: Four times a day (QID) | ORAL | 0 refills | Status: DC | PRN

## 2023-01-13 MED ORDER — BUPIVACAINE-EPINEPHRINE (PF) 0.25% -1:200000 IJ SOLN
INTRAMUSCULAR | Status: AC
  Filled 2023-01-13: qty 30

## 2023-01-13 MED ORDER — FENTANYL CITRATE (PF) 250 MCG/5ML IJ SOLN
INTRAMUSCULAR | Status: AC
  Filled 2023-01-13: qty 5

## 2023-01-13 MED ORDER — ROCURONIUM BROMIDE 10 MG/ML (PF) SYRINGE
PREFILLED_SYRINGE | INTRAVENOUS | Status: AC
  Filled 2023-01-13: qty 10

## 2023-01-13 MED ORDER — FENTANYL CITRATE (PF) 250 MCG/5ML IJ SOLN
INTRAMUSCULAR | Status: DC | PRN
  Administered 2023-01-13: 100 ug via INTRAVENOUS
  Administered 2023-01-13: 50 ug via INTRAVENOUS

## 2023-01-13 MED ORDER — PHENYLEPHRINE 80 MCG/ML (10ML) SYRINGE FOR IV PUSH (FOR BLOOD PRESSURE SUPPORT)
PREFILLED_SYRINGE | INTRAVENOUS | Status: AC
  Filled 2023-01-13: qty 10

## 2023-01-13 MED ORDER — SUGAMMADEX SODIUM 200 MG/2ML IV SOLN
INTRAVENOUS | Status: DC | PRN
  Administered 2023-01-13: 200 mg via INTRAVENOUS
  Administered 2023-01-13: 100 mg via INTRAVENOUS

## 2023-01-13 MED ORDER — CEFAZOLIN IN SODIUM CHLORIDE 3-0.9 GM/100ML-% IV SOLN
3.0000 g | INTRAVENOUS | Status: AC
  Administered 2023-01-13: 3 g via INTRAVENOUS
  Filled 2023-01-13: qty 100

## 2023-01-13 MED ORDER — FENTANYL CITRATE (PF) 100 MCG/2ML IJ SOLN
INTRAMUSCULAR | Status: AC
  Filled 2023-01-13: qty 2

## 2023-01-13 MED ORDER — OXYCODONE HCL 5 MG/5ML PO SOLN: 5.0000 mg | Freq: Once | ORAL | Status: AC | PRN

## 2023-01-13 MED ORDER — EPHEDRINE 5 MG/ML INJ
INTRAVENOUS | Status: AC
  Filled 2023-01-13: qty 5

## 2023-01-13 SURGICAL SUPPLY — 75 items
AGENT HMST KT MTR STRL THRMB (HEMOSTASIS)
APL SKNCLS STERI-STRIP NONHPOA (GAUZE/BANDAGES/DRESSINGS) ×1
BAG COUNTER SPONGE SURGICOUNT (BAG) ×1 IMPLANT
BAG SPNG CNTER NS LX DISP (BAG) ×1
BENZOIN TINCTURE PRP APPL 2/3 (GAUZE/BANDAGES/DRESSINGS) ×1 IMPLANT
BIT DRILL NEURO 2X3.1 SFT TUCH (MISCELLANEOUS) ×1 IMPLANT
BIT DRILL SRG 14X2.2XFLT CHK (BIT) IMPLANT
BIT DRL SRG 14X2.2XFLT CHK (BIT) ×1
BLADE CLIPPER SURG (BLADE) ×1 IMPLANT
BLADE SURG 15 STRL LF DISP TIS (BLADE) ×1 IMPLANT
BLADE SURG 15 STRL SS (BLADE) ×1
BONE CERV LORDOTIC 14.5X12X6 (Bone Implant) ×1 IMPLANT
BONE CERV LORDOTIC 14.5X12X7 (Bone Implant) ×1 IMPLANT
CLSR STERI-STRIP ANTIMIC 1/2X4 (GAUZE/BANDAGES/DRESSINGS) IMPLANT
COLLAR CERV LO CONTOUR FIRM DE (SOFTGOODS) IMPLANT
CORD BIPOLAR FORCEPS 12FT (ELECTRODE) ×1 IMPLANT
COVER SURGICAL LIGHT HANDLE (MISCELLANEOUS) ×1 IMPLANT
DRAIN JACKSON RD 7FR 3/32 (WOUND CARE) IMPLANT
DRAPE C-ARM 42X72 X-RAY (DRAPES) ×1 IMPLANT
DRAPE POUCH INSTRU U-SHP 10X18 (DRAPES) ×1 IMPLANT
DRAPE SURG 17X23 STRL (DRAPES) ×4 IMPLANT
DRILL BIT SKYLINE 14MM (BIT) ×1
DRILL NEURO 2X3.1 SOFT TOUCH (MISCELLANEOUS) ×1
DURAPREP 26ML APPLICATOR (WOUND CARE) ×1 IMPLANT
ELECT COATED BLADE 2.86 ST (ELECTRODE) ×1 IMPLANT
ELECT REM PT RETURN 9FT ADLT (ELECTROSURGICAL) ×1
ELECTRODE REM PT RTRN 9FT ADLT (ELECTROSURGICAL) ×1 IMPLANT
EVACUATOR SILICONE 100CC (DRAIN) IMPLANT
GAUZE 4X4 16PLY ~~LOC~~+RFID DBL (SPONGE) ×1 IMPLANT
GAUZE SPONGE 4X4 12PLY STRL (GAUZE/BANDAGES/DRESSINGS) ×1 IMPLANT
GLOVE BIO SURGEON STRL SZ 6.5 (GLOVE) ×1 IMPLANT
GLOVE BIO SURGEON STRL SZ8 (GLOVE) ×1 IMPLANT
GLOVE BIOGEL PI IND STRL 7.0 (GLOVE) ×2 IMPLANT
GLOVE BIOGEL PI IND STRL 8 (GLOVE) ×1 IMPLANT
GLOVE SURG ENC MOIS LTX SZ6.5 (GLOVE) ×1 IMPLANT
GOWN STRL REUS W/ TWL LRG LVL3 (GOWN DISPOSABLE) ×1 IMPLANT
GOWN STRL REUS W/ TWL XL LVL3 (GOWN DISPOSABLE) ×1 IMPLANT
GOWN STRL REUS W/TWL LRG LVL3 (GOWN DISPOSABLE) ×1
GOWN STRL REUS W/TWL XL LVL3 (GOWN DISPOSABLE) ×1
GRAFT BNE SPCR VG2 14.5X12X6 (Bone Implant) IMPLANT
GRAFT BNE SPCR VG2 14.5X12X7 (Bone Implant) IMPLANT
IV CATH 14GX2 1/4 (CATHETERS) ×1 IMPLANT
KIT BASIN OR (CUSTOM PROCEDURE TRAY) ×1 IMPLANT
KIT TURNOVER KIT B (KITS) ×1 IMPLANT
MANIFOLD NEPTUNE II (INSTRUMENTS) ×1 IMPLANT
NDL PRECISIONGLIDE 27X1.5 (NEEDLE) ×1 IMPLANT
NDL SPNL 20GX3.5 QUINCKE YW (NEEDLE) ×1 IMPLANT
NEEDLE PRECISIONGLIDE 27X1.5 (NEEDLE) ×1 IMPLANT
NEEDLE SPNL 20GX3.5 QUINCKE YW (NEEDLE) ×1 IMPLANT
NS IRRIG 1000ML POUR BTL (IV SOLUTION) ×1 IMPLANT
PACK ORTHO CERVICAL (CUSTOM PROCEDURE TRAY) ×1 IMPLANT
PAD ARMBOARD 7.5X6 YLW CONV (MISCELLANEOUS) ×2 IMPLANT
PATTIES SURGICAL .5 X.5 (GAUZE/BANDAGES/DRESSINGS) IMPLANT
PATTIES SURGICAL .5 X1 (DISPOSABLE) ×1 IMPLANT
PIN DISTRACTION 14 (PIN) IMPLANT
PLATE SKYLINE 2 LEVEL 34MM (Plate) IMPLANT
POSITIONER HEAD DONUT 9IN (MISCELLANEOUS) ×1 IMPLANT
SCREW SKYLINE VAR OS 14MM (Screw) IMPLANT
SPIKE FLUID TRANSFER (MISCELLANEOUS) ×1 IMPLANT
SPONGE INTESTINAL PEANUT (DISPOSABLE) ×1 IMPLANT
SPONGE SURGIFOAM ABS GEL 100 (HEMOSTASIS) ×1 IMPLANT
STRIP CLOSURE SKIN 1/2X4 (GAUZE/BANDAGES/DRESSINGS) ×1 IMPLANT
SURGIFLO W/THROMBIN 8M KIT (HEMOSTASIS) IMPLANT
SUT MNCRL AB 4-0 PS2 18 (SUTURE) ×1 IMPLANT
SUT SILK 4 0 (SUTURE)
SUT SILK 4-0 18XBRD TIE 12 (SUTURE) IMPLANT
SUT VIC AB 2-0 CT2 18 VCP726D (SUTURE) ×1 IMPLANT
SYR BULB IRRIG 60ML STRL (SYRINGE) ×1 IMPLANT
SYR CONTROL 10ML LL (SYRINGE) ×3 IMPLANT
TAPE CLOTH 4X10 WHT NS (GAUZE/BANDAGES/DRESSINGS) ×1 IMPLANT
TAPE UMBILICAL 1/8X30 (MISCELLANEOUS) ×2 IMPLANT
TOWEL GREEN STERILE (TOWEL DISPOSABLE) ×1 IMPLANT
TOWEL GREEN STERILE FF (TOWEL DISPOSABLE) ×1 IMPLANT
WATER STERILE IRR 1000ML POUR (IV SOLUTION) ×1 IMPLANT
YANKAUER SUCT BULB TIP NO VENT (SUCTIONS) ×1 IMPLANT

## 2023-01-13 NOTE — Transfer of Care (Signed)
Immediate Anesthesia Transfer of Care Note  Patient: Victor Medina  Procedure(s) Performed: CERVICAL 4- CERVICAL 5, CERVICAL 5- CERVICAL 6 ANTERIOR CERVICAL DECOMPRESSION FUSION WITH INSTRUMENTATION AND ALLOGRAFT  Patient Location: PACU  Anesthesia Type:General  Level of Consciousness: awake, alert , and oriented  Airway & Oxygen Therapy: Patient Spontanous Breathing and Patient connected to face mask oxygen  Post-op Assessment: Report given to RN and Post -op Vital signs reviewed and stable  Post vital signs: Reviewed and stable  Last Vitals:  Vitals Value Taken Time  BP 120/80 01/13/23 1026  Temp    Pulse 81 01/13/23 1029  Resp 18 01/13/23 1028  SpO2 91 % 01/13/23 1029  Vitals shown include unvalidated device data.  Last Pain:  Vitals:   01/13/23 0613  TempSrc: Oral  PainSc: 0-No pain         Complications: No notable events documented.

## 2023-01-13 NOTE — H&P (Signed)
PREOPERATIVE H&P  Chief Complaint: Right arm weakness  HPI: Victor Medina is a 77 y.o. male who presents with ongoing weakness in the right arm  MRI reveals severe stenosis at C4/5 and C5/6  Patient has failed multiple forms of conservative care and continues to have pain (see office notes for additional details regarding the patient's full course of treatment)  Past Medical History:  Diagnosis Date   Acquired dilation of ascending aorta and aortic root (HCC)    41 mm and 40 mm respectively   Arthritis    Asthma    Cancer (HCC)    mild skin   Chronic diastolic CHF (congestive heart failure) (HCC)    GERD (gastroesophageal reflux disease)    Hypertension    Morbid obesity (HCC)    OSA (obstructive sleep apnea)    PAF (paroxysmal atrial fibrillation) (HCC) 08/14/2015   s/p TEE/DCCV to NSR;  denies palplita    RBBB    atrial fib on cardizem   Past Surgical History:  Procedure Laterality Date   CARDIOVERSION N/A 05/19/2015   Procedure: CARDIOVERSION;  Surgeon: Pricilla Riffle, MD;  Location: [facility] ENDOSCOPY;  Service: Cardiovascular;  Laterality: N/A;   CARDIOVERSION N/A 12/17/2021   Procedure: CARDIOVERSION;  Surgeon: Pricilla Riffle, MD;  Location: [facility] ENDOSCOPY;  Service: Cardiovascular;  Laterality: N/A;   CATARACT EXTRACTION W/ INTRAOCULAR LENS IMPLANT Right    CHOLECYSTECTOMY N/A 12/01/2012   Procedure: LAPAROSCOPIC CHOLECYSTECTOMY;  Surgeon: Cherylynn Ridges, MD;  Location: [facility] OR;  Service: General;  Laterality: N/A;   COLONOSCOPY WITH PROPOFOL N/A 07/13/2019   Procedure: COLONOSCOPY WITH PROPOFOL;  Surgeon: Jeani Hawking, MD;  Location: [facility] ENDOSCOPY;  Service: Endoscopy;  Laterality: N/A;   MASS EXCISION Right 02/20/2018   Procedure: RIGHT THUMB EXCISION MASS AND DEBRIDEMENT DISTAL INTRPHLANGEAL JOINT;  Surgeon: Betha Loa, MD;  Location: [facility];  Service: Orthopedics;  Laterality: Right;   POLYPECTOMY  07/13/2019   Procedure: POLYPECTOMY;  Surgeon:  Jeani Hawking, MD;  Location: [facility] ENDOSCOPY;  Service: Endoscopy;;   right knee     meniscus removal 6 years ago   SHOULDER SURGERY Left    Rotator Cuff Repair   TEE WITHOUT CARDIOVERSION N/A 05/19/2015   Procedure: TRANSESOPHAGEAL ECHOCARDIOGRAM (TEE);  Surgeon: Pricilla Riffle, MD;  Location: [facility] ENDOSCOPY;  Service: Cardiovascular;  Laterality: N/A;   Social History   Socioeconomic History   Marital status: Married    Spouse name: Not on file   Number of children: Not on file   Years of education: Not on file   Highest education level: Not on file  Occupational History   Not on file  Tobacco Use   Smoking status: Former    Years: 20    Types: Cigarettes   Smokeless tobacco: Never   Tobacco comments:    Quit 45 years ago as of 2024  Vaping Use   Vaping Use: Never used  Substance and Sexual Activity   Alcohol use: Not Currently   Drug use: No   Sexual activity: Never  Other Topics Concern   Not on file  Social History Narrative   Not on file   Social Determinants of Health   Financial Resource Strain: Low Risk  (04/02/2022)   Overall Financial Resource Strain (CARDIA)    Difficulty of Paying Living Expenses: Not hard at all  Food Insecurity: No Food Insecurity (04/02/2022)   Hunger Vital Sign    Worried About Running Out of Food in the  Last Year: Never true    Ran Out of Food in the Last Year: Never true  Transportation Needs: No Transportation Needs (04/02/2022)   PRAPARE - Administrator, Civil Service (Medical): No    Lack of Transportation (Non-Medical): No  Physical Activity: Sufficiently Active (04/02/2022)   Exercise Vital Sign    Days of Exercise per Week: 3 days    Minutes of Exercise per Session: 60 min  Stress: No Stress Concern Present (04/02/2022)   [employer] of Occupational Health - Occupational Stress Questionnaire    Feeling of Stress : Not at all  Social Connections: Moderately Isolated (04/02/2022)   Social Connection and Isolation  Panel [NHANES]    Frequency of Communication with Friends and Family: Twice a week    Frequency of Social Gatherings with Friends and Family: Once a week    Attends Religious Services: Never    Database administrator or Organizations: No    Attends Engineer, structural: Never    Marital Status: Married   Family History  Problem Relation Age of Onset   Hypotension Mother    Heart disease Sister    Allergies  Allergen Reactions   Lipitor [Atorvastatin] Other (See Comments)    Painful joints    Prior to Admission medications   Medication Sig Start Date End Date Taking? Authorizing Provider  acetaminophen (TYLENOL) 500 MG tablet Take 1,000 mg by mouth every 6 (six) hours as needed for moderate pain.   Yes [provider]  apixaban (ELIQUIS) 5 MG TABS tablet Take 1 tablet (5 mg total) by mouth 2 (two) times daily. 11/04/22  Yes Turner, Cornelious Bryant, MD  APPLE CIDER VINEGAR PO Take 2 capsules by mouth daily.   Yes [provider]  Cholecalciferol (VITAMIN D3) 2000 UNITS TABS Take 4,000 Units by mouth daily.    Yes [provider]  diltiazem (CARDIZEM CD) 120 MG 24 hr capsule Take 1 capsule (120 mg total) by mouth 2 (two) times daily. 09/13/22  Yes Novella Olive, FNP  dronedarone (MULTAQ) 400 MG tablet Take 1 tablet (400 mg total) by mouth 2 (two) times daily with a meal. 06/11/22  Yes Newman Nip, NP  fluticasone (FLONASE) 50 MCG/ACT nasal spray Place 1 spray into both nostrils at bedtime.   Yes [provider]  fluticasone-salmeterol (ADVAIR) 100-50 MCG/ACT AEPB Inhale 1 puff into the lungs 2 (two) times daily. 06/25/22  Yes de [place], Raymond J, MD  hydrochlorothiazide (HYDRODIURIL) 25 MG tablet Take 25 mg by mouth daily.   Yes [provider]  loratadine (CLARITIN) 10 MG tablet Take 10 mg by mouth daily.   Yes [provider]  Magnesium 250 MG TABS Take 250 mg by mouth 2 (two) times daily.   Yes [provider]  metoprolol  tartrate (LOPRESSOR) 25 MG tablet Take 1 tablet (25 mg total) by mouth 2 (two) times daily. Patient taking differently: Take 37.5 mg by mouth 2 (two) times daily. 07/26/22  Yes [place], Raymond J, MD  Multiple Vitamins-Minerals (MULTIVITAMIN ADULT) CHEW Chew 2 each by mouth daily.   Yes [provider]  Omega-3 Fatty Acids (FISH OIL) 1000 MG CAPS Take 2,000 mg by mouth daily.   Yes [provider]  trolamine salicylate (ASPERCREME) 10 % cream Apply 1 application topically as needed for muscle pain.   Yes [provider]  furosemide (LASIX) 20 MG tablet Take 1 tablet (20 mg total) by mouth daily. Take 2 tabs  daily for 3 days, then reduce dose to one tablet daily. Patient not taking: Reported on 12/30/2022 09/03/22   Quintella Reichert, MD  tamsulosin (FLOMAX) 0.4 MG CAPS capsule TAKE 1 CAPSULE BY MOUTH EVERYDAY AT BEDTIME 10/14/22   [place], Buren Kos, MD     All other systems have been reviewed and were otherwise negative with the exception of those mentioned in the HPI and as above.  Physical Exam: Vitals:   01/13/23 0613  BP: 119/77  Pulse: 72  Resp: 16  Temp: 97.9 F (36.6 C)  SpO2: 96%    Body mass index is 44.48 kg/m.  General: Alert, no acute distress Cardiovascular: No pedal edema Respiratory: No cyanosis, no use of accessory musculature Skin: No lesions in the area of chief complaint Neurologic: Sensation intact distally Psychiatric: Patient is competent for consent with normal mood and affect Lymphatic: No axillary or cervical lymphadenopathy   Assessment/Plan: Spinal stenosis, including spinal cord compression at C4/5 and C5/6  Plan for Procedure(s): CERVICAL 4- CERVICAL 5, CERVICAL 5- CERVICAL 6 ANTERIOR CERVICAL DECOMPRESSION FUSION WITH INSTRUMENTATION AND ALLOGRAFT   Jackelyn Hoehn, MD 01/13/2023 6:48 AM

## 2023-01-13 NOTE — Op Note (Signed)
PATIENT NAME: Victor Medina   MEDICAL RECORD NO.:   [id number]    DATE OF BIRTH: 01-Oct-1945   DATE OF PROCEDURE: 01/13/2023                               OPERATIVE REPORT     PREOPERATIVE DIAGNOSES: 1. Right-sided cervical radiculopathy (M54.12) 2. Spinal stenosis spanning C4-C6. 3. Prominent right arm weakness   POSTOPERATIVE DIAGNOSES: 1. Right-sided cervical radiculopathy (M54.12) 2. Spinal stenosis spanning C4-C6. 3. Prominent right arm weakness   PROCEDURE: 1. Anterior cervical decompression and fusion C4-5, C-6. 2. Placement of anterior instrumentation, C4-C6. 3. Insertion of structural allograft  x2 (VG-2 allograft). 4. Intraoperative use of fluoroscopy.   SURGEON:  Estill Bamberg, MD   ASSISTANT:  Jason Coop, PA-C.   ANESTHESIA:  General endotracheal anesthesia.   COMPLICATIONS:  None.   DISPOSITION:  Stable.   ESTIMATED BLOOD LOSS:  Minimal.   INDICATIONS FOR SURGERY:  Briefly, Mr. Sivers is a pleasant 77 y.o. -year- old patient, who did present to me with primarily right arm weakness, and also pain.  The patient's MRI did reveal the findings noted above, notable for severe neuroforaminal stenosis on the right at C4-5 and C5-6, with cord compression also noted.  Given the patient's ongoing rather debilitating pain and lack of improvement with appropriate treatment measures, we did discuss proceeding with the procedure noted above.  The patient was fully aware of the risks and limitations of surgery as outlined in my preoperative note.   OPERATIVE DETAILS:  On 01/13/2023 the patient was brought to surgery and general endotracheal anesthesia was administered.  The patient was placed supine on the hospital bed. The neck was gently extended.  All bony prominences were meticulously padded.  The neck was prepped and draped in the usual sterile fashion.  At this point, I did make a left-sided transverse incision.  The platysma was incised.  A Smith-Robinson  approach was used and the anterior spine was identified. A self-retaining retractor was placed.  I then subperiosteally exposed the vertebral bodies from C4-C6.  Caspar pins were then placed into the C5 and C6 vertebral bodies and distraction was applied.  A thorough and complete C5-6 intervertebral diskectomy was performed.  The posterior longitudinal ligament was identified and entered using a nerve hook.  I then used #1 followed by #2 Kerrison to perform a thorough and complete intervertebral diskectomy.  The spinal canal was thoroughly decompressed, as was the right neuroforamen.  The endplates were then prepared and the appropriate-sized structural allograft was inserted into the intervertebral space.  The lower caspar pin was then removed and placed into the C4 vertebral body and once again, distraction was applied across the C4-5 intervertebral space.  I then again performed a thorough and complete diskectomy, thoroughly decompressing the spinal canal and right neuroforamen.  After preparing the endplates, the appropriate-sized structural allograft was placed into the intervertebral space uneventfully.  The Caspar pins then were removed and bone wax was placed in their place.  The appropriate-sized anterior cervical plate was placed over the anterior spine.  14 mm variable angle screws were placed, 2 in each vertebral body from C4-C6 for a total of 6 vertebral body screws.  The screws were then locked to the plate using the Cam locking mechanism.  I was very pleased with the final fluoroscopic images.  The wound was then irrigated.  The wound was then explored for any undue  bleeding and there was no bleeding noted. The wound was then closed in layers using 2-0 Vicryl, followed by 4-0 Monocryl.  Benzoin and Steri-Strips were applied, followed by sterile dressing.  All instrument counts were correct at the termination of the procedure.   Of note, Jason Coop, PA-C, was my assistant  throughout surgery, and did aid in retraction, suctioning, and closure from start to finish.         Estill Bamberg, MD

## 2023-01-13 NOTE — Anesthesia Procedure Notes (Addendum)
Procedure Name: Intubation Date/Time: 01/13/2023 7:46 AM  Performed by: Alwyn Ren, CRNAPre-anesthesia Checklist: Patient identified, Emergency Drugs available, Suction available and Patient being monitored Patient Re-evaluated:Patient Re-evaluated prior to induction Oxygen Delivery Method: Circle system utilized Preoxygenation: Pre-oxygenation with 100% oxygen Induction Type: IV induction Ventilation: Mask ventilation without difficulty Laryngoscope Size: Glidescope and 3 Grade View: Grade I Tube type: Oral Tube size: 7.0 mm Number of attempts: 1 Airway Equipment and Method: Oral airway and Rigid stylet Placement Confirmation: ETT inserted through vocal cords under direct vision, positive ETCO2 and breath sounds checked- equal and bilateral Secured at: 22 cm Tube secured with: Tape Dental Injury: Teeth and Oropharynx as per pre-operative assessment  Comments: Pt had full range of motion when airway exam performed in preop. Head and neck kept in alignment during induction and intubation. Teeth as before. Head positioned for surgery by PA

## 2023-01-15 ENCOUNTER — Emergency Department (HOSPITAL_BASED_OUTPATIENT_CLINIC_OR_DEPARTMENT_OTHER)
Admission: EM | Admit: 2023-01-15 | Discharge: 2023-01-15 | Disposition: A | Discharge status: Home / Self Care | Payer: Medicare HMO | Attending: Emergency Medicine | Admitting: Emergency Medicine

## 2023-01-15 ENCOUNTER — Other Ambulatory Visit: Payer: Self-pay

## 2023-01-15 ENCOUNTER — Emergency Department (HOSPITAL_BASED_OUTPATIENT_CLINIC_OR_DEPARTMENT_OTHER): Payer: Medicare HMO | Admitting: Radiology

## 2023-01-15 DIAGNOSIS — I110 Hypertensive heart disease with heart failure: Secondary | ICD-10-CM | POA: Insufficient documentation

## 2023-01-15 DIAGNOSIS — I5032 Chronic diastolic (congestive) heart failure: Secondary | ICD-10-CM | POA: Diagnosis not present

## 2023-01-15 DIAGNOSIS — R609 Edema, unspecified: Secondary | ICD-10-CM | POA: Diagnosis not present

## 2023-01-15 DIAGNOSIS — R600 Localized edema: Secondary | ICD-10-CM | POA: Diagnosis not present

## 2023-01-15 DIAGNOSIS — J45909 Unspecified asthma, uncomplicated: Secondary | ICD-10-CM | POA: Diagnosis not present

## 2023-01-15 DIAGNOSIS — R1310 Dysphagia, unspecified: Secondary | ICD-10-CM | POA: Diagnosis not present

## 2023-01-15 MED ORDER — DEXAMETHASONE SODIUM PHOSPHATE 10 MG/ML IJ SOLN
10.0000 mg | Freq: Once | INTRAMUSCULAR | Status: AC
  Administered 2023-01-15: 10 mg via INTRAVENOUS
  Filled 2023-01-15: qty 1

## 2023-01-15 NOTE — ED Provider Notes (Signed)
[place] EMERGENCY DEPARTMENT AT [facility] Provider Note   CSN: [id number] Arrival date & time: 01/15/23  1551     History  Chief Complaint  Patient presents with   Post-op Problem    neck    Victor Medina is a 77 y.o. male.  HPI Patient presents with some difficulty swallowing.  Had anterior approach cervical surgery 2 days ago.  Started on the Medrol Dosepak today.  States having some more difficulty swallowing.  Still able to breathe.  Able to handle secretions but more trouble eating.  No fevers has occasional coughing.  Had called orthopedic surgery reportedly a few times and told to come in here.  I discussed with Kayla from orthopedic surgery.   Past Medical History:  Diagnosis Date   Acquired dilation of ascending aorta and aortic root (HCC)    41 mm and 40 mm respectively   Arthritis    Asthma    Cancer (HCC)    mild skin   Chronic diastolic CHF (congestive heart failure) (HCC)    GERD (gastroesophageal reflux disease)    Hypertension    Morbid obesity (HCC)    OSA (obstructive sleep apnea)    PAF (paroxysmal atrial fibrillation) (HCC) 08/14/2015   s/p TEE/DCCV to NSR;  denies palplita    RBBB    atrial fib on cardizem    Home Medications Prior to Admission medications   Medication Sig Start Date End Date Taking? Authorizing Provider  acetaminophen (TYLENOL) 500 MG tablet Take 1,000 mg by mouth every 6 (six) hours as needed for moderate pain.    [provider]  APPLE CIDER VINEGAR PO Take 2 capsules by mouth daily.    [provider]  Cholecalciferol (VITAMIN D3) 2000 UNITS TABS Take 4,000 Units by mouth daily.     [provider]  diltiazem (CARDIZEM CD) 120 MG 24 hr capsule Take 1 capsule (120 mg total) by mouth 2 (two) times daily. 09/13/22   Novella Olive, FNP  dronedarone (MULTAQ) 400 MG tablet Take 1 tablet (400 mg total) by mouth 2 (two) times daily with a meal. 06/11/22   Newman Nip, NP  fluticasone  (FLONASE) 50 MCG/ACT nasal spray Place 1 spray into both nostrils at bedtime.    [provider]  fluticasone-salmeterol (ADVAIR) 100-50 MCG/ACT AEPB Inhale 1 puff into the lungs 2 (two) times daily. 06/25/22   de [place], Raymond J, MD  furosemide (LASIX) 20 MG tablet Take 1 tablet (20 mg total) by mouth daily. Take 2 tabs daily for 3 days, then reduce dose to one tablet daily. Patient not taking: Reported on 12/30/2022 09/03/22   Quintella Reichert, MD  hydrochlorothiazide (HYDRODIURIL) 25 MG tablet Take 25 mg by mouth daily.    [provider]  HYDROcodone-acetaminophen (NORCO/VICODIN) 5-325 MG tablet Take 1 tablet by mouth every 6 (six) hours as needed for moderate pain or severe pain. 01/13/23 01/13/24  McKenzie, Eilene Ghazi, PA-C  loratadine (CLARITIN) 10 MG tablet Take 10 mg by mouth daily.    [provider]  Magnesium 250 MG TABS Take 250 mg by mouth 2 (two) times daily.    [provider]  methocarbamol (ROBAXIN) 750 MG tablet Take 1 tablet (750 mg total) by mouth every 6 (six) hours as needed for muscle spasms. 01/13/23   McKenzie, Eilene Ghazi, PA-C  metoprolol tartrate (LOPRESSOR) 25 MG tablet Take 1 tablet (25 mg total) by mouth 2 (two) times daily. Patient taking differently: Take 37.5 mg  by mouth 2 (two) times daily. 07/26/22   de [place], Raymond J, MD  Multiple Vitamins-Minerals (MULTIVITAMIN ADULT) CHEW Chew 2 each by mouth daily.    [provider]  tamsulosin (FLOMAX) 0.4 MG CAPS capsule TAKE 1 CAPSULE BY MOUTH EVERYDAY AT BEDTIME 01/13/23   de [place], Buren Kos, MD  trolamine salicylate (ASPERCREME) 10 % cream Apply 1 application topically as needed for muscle pain.    [provider]      Allergies    Lipitor [atorvastatin]    Review of Systems   Review of Systems  Physical Exam Updated Vital Signs BP 115/76   Pulse 75   Temp 98.3 F (36.8 C) (Oral)   Resp 16   Ht 6\' 1"  (1.854 m)   Wt (!) 152 kg   SpO2 95%   BMI 44.21 kg/m  Physical  Exam Vitals reviewed.  Neck:     Comments: Cervical collar in place.  Dressing clean dry and intact on anterior neck.  No stridor. Neurological:     Mental Status: He is alert.     ED Results / Procedures / Treatments   Labs (all labs ordered are listed, but only abnormal results are displayed) Labs Reviewed - No data to display  EKG None  Radiology DG Neck Soft Tissue  Result Date: 01/15/2023 CLINICAL DATA:  Difficulty swallowing. Status post C4-6 fusion two days ago EXAM: NECK SOFT TISSUES - 2 VIEW COMPARISON:  Fluoroscopy 01/13/2019 FINDINGS: Anterior fixation plate with screws and prosthetic disc material seen along the cervical spine at C4 through C6. Expected alignment. Degenerative changes throughout the visualized portions of the cervical spine including along the facet joints. There is significant soft tissue swelling in the prevertebral space extending from C3 to the upper thoracic region. Slight airway narrowing of the level of the epiglottis. The epiglottis appears thin. Mild soft tissue gas anteriorly. Presumed vascular calcifications. Overlapping neck brace on the frontal view with artifact. Dental hardware. IMPRESSION: Postop changes from anterior fusion of the spine from C4 through C6 with the associated prevertebral soft tissue thickening. Subtle gas. Some narrowing of the airway in the lateral view at the level of the epiglottis. Please correlate with specific symptoms and these findings relative to expected postop changes. Additional workup as clinically directed. Electronically Signed   By: Karen Kays M.D.   On: 01/15/2023 17:33    Procedures Procedures    Medications Ordered in ED Medications  dexamethasone (DECADRON) injection 10 mg (10 mg Intravenous Given 01/15/23 1637)    ED Course/ Medical Decision Making/ A&P                             Medical Decision Making Amount and/or Complexity of Data Reviewed Radiology: ordered.  Risk Prescription drug  management.   Patient with reported difficulty swallowing post anterior approach cervical surgery.  Discussed with orthopedic surgery, Kayla.  She feels does not need imaging at this time.  Less likely hematoma or other cause.  More likely caused by edema.  Recommends IV Decadron and monitoring.  Potential soft tissue x-ray to evaluate and I will get this.  Soft tissue x-ray done and had expected postoperative swelling.  Feeling somewhat better after the Decadron.  I think that single dose of the 10 of Decadron is good for now.  She can start back on the Dosepak and follow-up short-term.  Lungs are clear.  Final Clinical Impression(s) / ED Diagnoses Final diagnoses:  Postoperative edema    Rx / DC Orders ED Discharge Orders     None         Benjiman Core, MD 01/15/23 [phone]

## 2023-01-15 NOTE — Discharge Instructions (Signed)
Start back on the Dosepak.  Return for worsening difficulty breathing and swallowing.

## 2023-01-15 NOTE — ED Triage Notes (Signed)
Ambulatory to triage. C-collar in place.   C4-6 fusion Thursday. Difficulty swallowing today- panic inducing. This has been disrupting sleep as well.   Called surgical office today and was started on prednisone-morning dose has been taken.. Instructed to come to ER for CT- possible hematoma.

## 2023-01-15 NOTE — ED Notes (Signed)
Discharge instructions reviewed with patient. Patient questions answered and opportunity for education reviewed. Patient voices understanding of discharge instructions with no further questions. Patient ambulatory with steady gait to lobby.  

## 2023-01-16 ENCOUNTER — Emergency Department (HOSPITAL_BASED_OUTPATIENT_CLINIC_OR_DEPARTMENT_OTHER)
Admission: EM | Admit: 2023-01-16 | Discharge: 2023-01-16 | Disposition: A | Discharge status: Home / Self Care | Payer: Medicare HMO | Attending: Emergency Medicine | Admitting: Emergency Medicine

## 2023-01-16 ENCOUNTER — Emergency Department (HOSPITAL_BASED_OUTPATIENT_CLINIC_OR_DEPARTMENT_OTHER): Payer: Medicare HMO

## 2023-01-16 ENCOUNTER — Encounter (HOSPITAL_BASED_OUTPATIENT_CLINIC_OR_DEPARTMENT_OTHER): Payer: Self-pay | Admitting: Emergency Medicine

## 2023-01-16 DIAGNOSIS — R2243 Localized swelling, mass and lump, lower limb, bilateral: Secondary | ICD-10-CM | POA: Insufficient documentation

## 2023-01-16 DIAGNOSIS — F19982 Other psychoactive substance use, unspecified with psychoactive substance-induced sleep disorder: Secondary | ICD-10-CM | POA: Diagnosis not present

## 2023-01-16 DIAGNOSIS — J45909 Unspecified asthma, uncomplicated: Secondary | ICD-10-CM | POA: Diagnosis not present

## 2023-01-16 DIAGNOSIS — I110 Hypertensive heart disease with heart failure: Secondary | ICD-10-CM | POA: Diagnosis not present

## 2023-01-16 DIAGNOSIS — E669 Obesity, unspecified: Secondary | ICD-10-CM | POA: Insufficient documentation

## 2023-01-16 DIAGNOSIS — G4700 Insomnia, unspecified: Secondary | ICD-10-CM | POA: Diagnosis not present

## 2023-01-16 DIAGNOSIS — R0602 Shortness of breath: Secondary | ICD-10-CM | POA: Diagnosis not present

## 2023-01-16 DIAGNOSIS — I5032 Chronic diastolic (congestive) heart failure: Secondary | ICD-10-CM | POA: Insufficient documentation

## 2023-01-16 LAB — COMPREHENSIVE METABOLIC PANEL
ALT: 8 U/L (ref 0–44)
AST: 12 U/L — ABNORMAL LOW (ref 15–41)
Albumin: 4.2 g/dL (ref 3.5–5.0)
Alkaline Phosphatase: 48 U/L (ref 38–126)
Anion gap: 9 (ref 5–15)
BUN: 17 mg/dL (ref 8–23)
CO2: 27 mmol/L (ref 22–32)
Calcium: 9.8 mg/dL (ref 8.9–10.3)
Chloride: 101 mmol/L (ref 98–111)
Creatinine, Ser: 0.98 mg/dL (ref 0.61–1.24)
GFR, Estimated: >60 mL/min (ref >60)
Glucose, Bld: 143 mg/dL — ABNORMAL HIGH (ref 70–99)
Potassium: 4 mmol/L (ref 3.5–5.1)
Sodium: 137 mmol/L (ref 135–145)
Total Bilirubin: 1 mg/dL (ref 0.3–1.2)
Total Protein: 6.6 g/dL (ref 6.5–8.1)

## 2023-01-16 LAB — CBC WITH DIFFERENTIAL/PLATELET
Abs Immature Granulocytes: 0.05 10*3/uL (ref 0.00–0.07)
Basophils Absolute: 0 10*3/uL (ref 0.0–0.1)
Basophils Relative: 0 %
Eosinophils Absolute: 0 10*3/uL (ref 0.0–0.5)
Eosinophils Relative: 0 %
HCT: 47.6 % (ref 39.0–52.0)
Hemoglobin: 16.1 g/dL (ref 13.0–17.0)
Immature Granulocytes: 1 %
Lymphocytes Relative: 12 %
Lymphs Abs: 0.7 10*3/uL (ref 0.7–4.0)
MCH: 29 pg (ref 26.0–34.0)
MCHC: 33.8 g/dL (ref 30.0–36.0)
MCV: 85.6 fL (ref 80.0–100.0)
Monocytes Absolute: 0.3 10*3/uL (ref 0.1–1.0)
Monocytes Relative: 5 %
Neutro Abs: 5.2 10*3/uL (ref 1.7–7.7)
Neutrophils Relative %: 82 %
Platelets: 262 10*3/uL (ref 150–400)
RBC: 5.56 MIL/uL (ref 4.22–5.81)
RDW: 12.6 % (ref 11.5–15.5)
WBC: 6.3 10*3/uL (ref 4.0–10.5)
nRBC: 0 % (ref 0.0–0.2)

## 2023-01-16 LAB — BRAIN NATRIURETIC PEPTIDE: B Natriuretic Peptide: 124.7 pg/mL — ABNORMAL HIGH (ref 0.0–100.0)

## 2023-01-16 NOTE — ED Triage Notes (Signed)
Pt presents to ED POV. Pt c/o SOB since surgery on Thursday. Pt reports that he was seen here yesterday. Given prednisone and doesn't think that it has helped. Pt dyspneic while ambulating to room. 96% on RA.

## 2023-01-16 NOTE — ED Provider Notes (Signed)
[place] EMERGENCY DEPARTMENT AT [facility] Provider Note  CSN: [id number] Arrival date & time: 01/16/23 0544  Chief Complaint(s) Shortness of Breath  HPI Victor Medina is a 77 y.o. male with a past medical history listed below including chronic diastolic heart failure, paroxysmal A-fib, asthma, cervical stenosis status post recent anterior cervical fixation who was seen yesterday in this emergency department for throat fullness and difficulty swallowing that was attributed to likely postoperative swelling.  This improved after patient received an IM dose of Decadron.  Patient was prescribed Medrol Dosepak and took the first dose last night.  Patient reports that the throat fullness and difficulty swallowing has improved.  He returns tonight due to inability to sleep.  He states that he feels like his lungs sound like he is has an asthma attack but denies any shortness of breath.  He denies any chest pain.  He does have mild peripheral edema.  No other physical complaints.  Patient has not restarted his Eliquis  The history is provided by the patient.    Past Medical History Past Medical History:  Diagnosis Date   Acquired dilation of ascending aorta and aortic root (HCC)    41 mm and 40 mm respectively   Arthritis    Asthma    Cancer (HCC)    mild skin   Chronic diastolic CHF (congestive heart failure) (HCC)    GERD (gastroesophageal reflux disease)    Hypertension    Morbid obesity (HCC)    OSA (obstructive sleep apnea)    PAF (paroxysmal atrial fibrillation) (HCC) 08/14/2015   s/p TEE/DCCV to NSR;  denies palplita    RBBB    atrial fib on cardizem   Patient Active Problem List   Diagnosis Date Noted   Cough 07/08/2022   Wellness examination 01/19/2022   Persistent atrial fibrillation (HCC) 11/27/2021   Bilateral knee pain 09/29/2021   Hyperlipidemia 09/20/2018   PAF (paroxysmal atrial fibrillation) (HCC) 08/14/2015   Acquired dilation of ascending aorta  and aortic root (HCC) 10/26/2013   Edema of extremities 10/26/2013   HTN (hypertension) 10/26/2013   Chronic diastolic CHF (congestive heart failure) (HCC)    RBBB    Obstructive sleep apnea 08/16/2013   Morbid obesity (HCC) 08/16/2013   Rotator cuff disorder 08/16/2013   Cholecystitis, acute s/p laparoscopic cholecystectomy 12/01/12 12/02/2012   Home Medication(s) Prior to Admission medications   Medication Sig Start Date End Date Taking? Authorizing Provider  acetaminophen (TYLENOL) 500 MG tablet Take 1,000 mg by mouth every 6 (six) hours as needed for moderate pain.    [provider]  APPLE CIDER VINEGAR PO Take 2 capsules by mouth daily.    [provider]  Cholecalciferol (VITAMIN D3) 2000 UNITS TABS Take 4,000 Units by mouth daily.     [provider]  diltiazem (CARDIZEM CD) 120 MG 24 hr capsule Take 1 capsule (120 mg total) by mouth 2 (two) times daily. 09/13/22   Novella Olive, FNP  dronedarone (MULTAQ) 400 MG tablet Take 1 tablet (400 mg total) by mouth 2 (two) times daily with a meal. 06/11/22   Newman Nip, NP  fluticasone (FLONASE) 50 MCG/ACT nasal spray Place 1 spray into both nostrils at bedtime.    [provider]  fluticasone-salmeterol (ADVAIR) 100-50 MCG/ACT AEPB Inhale 1 puff into the lungs 2 (two) times daily. 06/25/22   de [place], Raymond J, MD  furosemide (LASIX) 20 MG tablet Take 1 tablet (20 mg total) by mouth daily. Take 2  tabs daily for 3 days, then reduce dose to one tablet daily. Patient not taking: Reported on 12/30/2022 09/03/22   Quintella Reichert, MD  hydrochlorothiazide (HYDRODIURIL) 25 MG tablet Take 25 mg by mouth daily.    [provider]  HYDROcodone-acetaminophen (NORCO/VICODIN) 5-325 MG tablet Take 1 tablet by mouth every 6 (six) hours as needed for moderate pain or severe pain. 01/13/23 01/13/24  McKenzie, Eilene Ghazi, PA-C  loratadine (CLARITIN) 10 MG tablet Take 10 mg by mouth daily.    [provider]   Magnesium 250 MG TABS Take 250 mg by mouth 2 (two) times daily.    [provider]  methocarbamol (ROBAXIN) 750 MG tablet Take 1 tablet (750 mg total) by mouth every 6 (six) hours as needed for muscle spasms. 01/13/23   McKenzie, Eilene Ghazi, PA-C  metoprolol tartrate (LOPRESSOR) 25 MG tablet Take 1 tablet (25 mg total) by mouth 2 (two) times daily. Patient taking differently: Take 37.5 mg by mouth 2 (two) times daily. 07/26/22   de [place], Raymond J, MD  Multiple Vitamins-Minerals (MULTIVITAMIN ADULT) CHEW Chew 2 each by mouth daily.    [provider]  tamsulosin (FLOMAX) 0.4 MG CAPS capsule TAKE 1 CAPSULE BY MOUTH EVERYDAY AT BEDTIME 01/13/23   de [place], Buren Kos, MD  trolamine salicylate (ASPERCREME) 10 % cream Apply 1 application topically as needed for muscle pain.    [provider]                                                                                                                                    Allergies Lipitor [atorvastatin]  Review of Systems Review of Systems As noted in HPI  Physical Exam Vital Signs  I have reviewed the triage vital signs BP (!) 137/91   Pulse 93   Temp (!) 97.4 F (36.3 C) (Oral)   Resp 18   SpO2 96%   Physical Exam Vitals reviewed.  Constitutional:      General: He is not in acute distress.    Appearance: He is well-developed. He is obese. He is not diaphoretic.  HENT:     Head: Normocephalic and atraumatic.     Nose: Nose normal.  Eyes:     General: No scleral icterus.       Right eye: No discharge.        Left eye: No discharge.     Conjunctiva/sclera: Conjunctivae normal.     Pupils: Pupils are equal, round, and reactive to light.  Neck:      Comments: Cervical collar in place Cardiovascular:     Rate and Rhythm: Normal rate. Rhythm regularly irregular.     Heart sounds: No murmur heard.    No friction rub. No gallop.  Pulmonary:     Effort: Pulmonary effort is normal. No respiratory distress.      Breath sounds: Normal breath sounds. No stridor or decreased air movement.  No decreased breath sounds, wheezing, rhonchi or rales.  Abdominal:     General: There is no distension.     Palpations: Abdomen is soft.     Tenderness: There is no abdominal tenderness.  Musculoskeletal:        General: No tenderness.     Cervical back: Normal range of motion and neck supple.     Right lower leg: Edema (trace) present.     Left lower leg: Edema (trace) present.  Skin:    General: Skin is warm and dry.     Findings: No erythema or rash.  Neurological:     Mental Status: He is alert and oriented to person, place, and time.     ED Results and Treatments Labs (all labs ordered are listed, but only abnormal results are displayed) Labs Reviewed  COMPREHENSIVE METABOLIC PANEL - Abnormal; Notable for the following components:      Result Value   Glucose, Bld 143 (*)    AST 12 (*)    All other components within normal limits  BRAIN NATRIURETIC PEPTIDE - Abnormal; Notable for the following components:   B Natriuretic Peptide 124.7 (*)    All other components within normal limits  CBC WITH DIFFERENTIAL/PLATELET                                                                                                                         EKG  EKG Interpretation  Date/Time:  Sunday January 16 2023 06:04:47 EDT Ventricular Rate:  93 PR Interval:  135 QRS Duration: 156 QT Interval:  399 QTC Calculation: 497 R Axis:   -42 Text Interpretation: Sinus rhythm Atrial premature complexes RBBB and LAFB Left ventricular hypertrophy Otherwise no significant change Confirmed by Drema Pry [phone]) on 01/16/2023 6:44:12 AM       Radiology DG Neck Soft Tissue  Result Date: 01/15/2023 CLINICAL DATA:  Difficulty swallowing. Status post C4-6 fusion two days ago EXAM: NECK SOFT TISSUES - 2 VIEW COMPARISON:  Fluoroscopy 01/13/2019 FINDINGS: Anterior fixation plate with screws and prosthetic disc material seen along  the cervical spine at C4 through C6. Expected alignment. Degenerative changes throughout the visualized portions of the cervical spine including along the facet joints. There is significant soft tissue swelling in the prevertebral space extending from C3 to the upper thoracic region. Slight airway narrowing of the level of the epiglottis. The epiglottis appears thin. Mild soft tissue gas anteriorly. Presumed vascular calcifications. Overlapping neck brace on the frontal view with artifact. Dental hardware. IMPRESSION: Postop changes from anterior fusion of the spine from C4 through C6 with the associated prevertebral soft tissue thickening. Subtle gas. Some narrowing of the airway in the lateral view at the level of the epiglottis. Please correlate with specific symptoms and these findings relative to expected postop changes. Additional workup as clinically directed. Electronically Signed   By: Karen Kays M.D.   On: 01/15/2023 17:33    Medications Ordered in ED Medications - No data to  display Procedures Procedures  (including critical care time) Medical Decision Making / ED Course   Medical Decision Making Amount and/or Complexity of Data Reviewed Labs: ordered. Radiology: ordered.    Patient presents with difficulty sleeping and noisy breathing.  Patient is well-appearing, with no respiratory distress satting above 95% on room air.  Lungs are clear to auscultation bilaterally.  Patient has no stridor on exam concerning for airway obstruction.  Insomnia is likely related to largest dose of steroids yesterday. Will assess for any electrolyte or metabolic derangements, anemia, evidence of heart failure exacerbation.  Will check for dysrhythmias or blocks.  EKG without acute ischemic changes, infrequent PACs.  No dysrhythmias or blocks. CBC without leukocytosis or anemia CMP without significant electrolyte derangements or renal sufficiency. BNP 120s reassuring. Chest x-ray without evidence  of pneumonia, pneumothorax, or pleural effusions.  Patient does have pulmonary vascular congestion without overt edema.  Patient remained stable without any respiratory distress. Updated on findings.. Recommended he hold his steroid dose for today and restart tomorrow morning.     Final Clinical Impression(s) / ED Diagnoses Final diagnoses:  Drug-induced insomnia (HCC)   The patient appears reasonably screened and/or stabilized for discharge and I doubt any other medical condition or other [facility] requiring further screening, evaluation, or treatment in the ED at this time. I have discussed the findings, Dx and Tx plan with the patient/family who expressed understanding and agree(s) with the plan. Discharge instructions discussed at length. The patient/family was given strict return precautions who verbalized understanding of the instructions. No further questions at time of discharge.  Disposition: Discharge  Condition: Good  ED Discharge Orders     None        Follow Up: de [place], Raymond J, MD [address] [place] [address] [phone]  Call  to schedule an appointment for close follow up  Estill Bamberg, MD [address] [place] [address] [phone]  Go to  as scheduled    This chart was dictated using voice recognition software.  Despite best efforts to proofread,  errors can occur which can change the documentation meaning.    Nira Conn, MD 01/16/23 [phone]

## 2023-01-17 NOTE — Anesthesia Postprocedure Evaluation (Signed)
Anesthesia Post Note  Patient: Victor Medina  Procedure(s) Performed: CERVICAL 4- CERVICAL 5, CERVICAL 5- CERVICAL 6 ANTERIOR CERVICAL DECOMPRESSION FUSION WITH INSTRUMENTATION AND ALLOGRAFT     Patient location during evaluation: PACU Anesthesia Type: General Level of consciousness: awake and alert Pain management: pain level controlled Vital Signs Assessment: post-procedure vital signs reviewed and stable Respiratory status: spontaneous breathing, nonlabored ventilation, respiratory function stable and patient connected to nasal cannula oxygen Cardiovascular status: blood pressure returned to baseline and stable Postop Assessment: no apparent nausea or vomiting Anesthetic complications: no   No notable events documented.  Last Vitals:  Vitals:   01/13/23 1115 01/13/23 1130  BP: 108/62   Pulse: 69 68  Resp: 12 14  Temp:  36.9 C  SpO2: 92% 92%    Last Pain:  Vitals:   01/13/23 1130  TempSrc:   PainSc: 2                  Nikolai Wilczak S

## 2023-01-20 ENCOUNTER — Emergency Department (HOSPITAL_BASED_OUTPATIENT_CLINIC_OR_DEPARTMENT_OTHER)
Admission: EM | Admit: 2023-01-20 | Discharge: 2023-01-20 | Disposition: A | Discharge status: Home / Self Care | Payer: Medicare HMO | Attending: Emergency Medicine | Admitting: Emergency Medicine

## 2023-01-20 ENCOUNTER — Encounter (HOSPITAL_BASED_OUTPATIENT_CLINIC_OR_DEPARTMENT_OTHER): Payer: Self-pay

## 2023-01-20 ENCOUNTER — Emergency Department (HOSPITAL_BASED_OUTPATIENT_CLINIC_OR_DEPARTMENT_OTHER): Payer: Medicare HMO | Admitting: Radiology

## 2023-01-20 ENCOUNTER — Other Ambulatory Visit: Payer: Self-pay

## 2023-01-20 DIAGNOSIS — Z79899 Other long term (current) drug therapy: Secondary | ICD-10-CM | POA: Insufficient documentation

## 2023-01-20 DIAGNOSIS — R0602 Shortness of breath: Secondary | ICD-10-CM | POA: Insufficient documentation

## 2023-01-20 DIAGNOSIS — I509 Heart failure, unspecified: Secondary | ICD-10-CM | POA: Insufficient documentation

## 2023-01-20 DIAGNOSIS — Z7901 Long term (current) use of anticoagulants: Secondary | ICD-10-CM | POA: Diagnosis not present

## 2023-01-20 DIAGNOSIS — R1310 Dysphagia, unspecified: Secondary | ICD-10-CM | POA: Insufficient documentation

## 2023-01-20 DIAGNOSIS — J189 Pneumonia, unspecified organism: Secondary | ICD-10-CM | POA: Diagnosis not present

## 2023-01-20 DIAGNOSIS — I4891 Unspecified atrial fibrillation: Secondary | ICD-10-CM | POA: Insufficient documentation

## 2023-01-20 LAB — CBC WITH DIFFERENTIAL/PLATELET
Abs Immature Granulocytes: 0.09 10*3/uL — ABNORMAL HIGH (ref 0.00–0.07)
Basophils Absolute: 0.1 10*3/uL (ref 0.0–0.1)
Basophils Relative: 1 %
Eosinophils Absolute: 0.1 10*3/uL (ref 0.0–0.5)
Eosinophils Relative: 2 %
HCT: 45.5 % (ref 39.0–52.0)
Hemoglobin: 15 g/dL (ref 13.0–17.0)
Immature Granulocytes: 1 %
Lymphocytes Relative: 21 %
Lymphs Abs: 1.4 10*3/uL (ref 0.7–4.0)
MCH: 28.6 pg (ref 26.0–34.0)
MCHC: 33 g/dL (ref 30.0–36.0)
MCV: 86.7 fL (ref 80.0–100.0)
Monocytes Absolute: 0.6 10*3/uL (ref 0.1–1.0)
Monocytes Relative: 9 %
Neutro Abs: 4.4 10*3/uL (ref 1.7–7.7)
Neutrophils Relative %: 66 %
Platelets: 165 10*3/uL (ref 150–400)
RBC: 5.25 MIL/uL (ref 4.22–5.81)
RDW: 13.2 % (ref 11.5–15.5)
WBC: 6.7 10*3/uL (ref 4.0–10.5)
nRBC: 0 % (ref 0.0–0.2)

## 2023-01-20 LAB — BASIC METABOLIC PANEL
Anion gap: 7 (ref 5–15)
BUN: 25 mg/dL — ABNORMAL HIGH (ref 8–23)
CO2: 28 mmol/L (ref 22–32)
Calcium: 9.3 mg/dL (ref 8.9–10.3)
Chloride: 103 mmol/L (ref 98–111)
Creatinine, Ser: 1.09 mg/dL (ref 0.61–1.24)
GFR, Estimated: >60 mL/min (ref >60)
Glucose, Bld: 98 mg/dL (ref 70–99)
Potassium: 4.8 mmol/L (ref 3.5–5.1)
Sodium: 138 mmol/L (ref 135–145)

## 2023-01-20 LAB — BRAIN NATRIURETIC PEPTIDE: B Natriuretic Peptide: 73.3 pg/mL (ref 0.0–100.0)

## 2023-01-20 LAB — D-DIMER, QUANTITATIVE: D-Dimer, Quant: 0.46 ug/mL-FEU (ref 0.00–0.50)

## 2023-01-20 LAB — TROPONIN I (HIGH SENSITIVITY): Troponin I (High Sensitivity): 6 ng/L (ref <18)

## 2023-01-20 MED ORDER — ALBUTEROL SULFATE (2.5 MG/3ML) 0.083% IN NEBU
2.5000 mg | INHALATION_SOLUTION | Freq: Once | RESPIRATORY_TRACT | Status: AC
  Administered 2023-01-20: 2.5 mg via RESPIRATORY_TRACT
  Filled 2023-01-20: qty 3

## 2023-01-20 MED ORDER — GUAIFENESIN ER 600 MG PO TB12: 600.0000 mg | ORAL_TABLET | Freq: Two times a day (BID) | ORAL | 0 refills | Status: AC | PRN

## 2023-01-20 MED ORDER — IPRATROPIUM-ALBUTEROL 0.5-2.5 (3) MG/3ML IN SOLN
3.0000 mL | Freq: Once | RESPIRATORY_TRACT | Status: AC
  Administered 2023-01-20: 3 mL via RESPIRATORY_TRACT
  Filled 2023-01-20: qty 3

## 2023-01-20 NOTE — Discharge Instructions (Addendum)
I recommend following up with your spinal surgeon about the persistent pain that you have been having with swallowing since your surgery.  This may be due to irritation with your anesthesia during surgery, or it may be swollen lymph nodes in this area, or another complication related to surgery.  Your surgeon's office should be aware of this, given how long this has been going on.  I also recommend that you begin taking Mucinex to help thin out your secretions.

## 2023-01-20 NOTE — ED Triage Notes (Signed)
Pt c/o SHOB onset 2a, states "my oxygen is perfect but I get caught & I can't breathe." Cervical fusion last Thursday, states he's felt like he's had "phlegm & stuff since." Hx afib, CHF, OSA w CPAP. Voices compliance w all home medications "but I'm not on eliquis right now since the surgery."

## 2023-01-20 NOTE — ED Provider Notes (Signed)
[place] EMERGENCY DEPARTMENT AT [facility] Provider Note   CSN: [id number] Arrival date & time: 01/20/23  7829     History  Chief Complaint  Patient presents with   Shortness of Breath    Victor Medina is a 77 y.o. male with a history of A-fib, formerly on Xarelto was taken off recently for surgery, history of cervical fusion, history of congestive heart failure, presented to ED with shortness of breath.  Patient reports that he woke up abruptly from sleep melanite and felt like "I could not catch my breath".  He sat upright, reports he has had productive sputum and productive cough for several days.  He says he has had significant pain in his neck since his cervical surgery with difficulty swallowing.  He typically wears a CPAP at night but was unable to wear it due to his C-spine collar.  He says he has been off of his Eliquis since the surgery, did not restart it because "no one told me to restart it".  Denies history of DVT or PE  HPI     Home Medications Prior to Admission medications   Medication Sig Start Date End Date Taking? Authorizing Provider  guaiFENesin (MUCINEX) 600 MG 12 hr tablet Take 1 tablet (600 mg total) by mouth 2 (two) times daily as needed for up to 10 days. 01/20/23 01/30/23 Yes Maame Dack, Kermit Balo, MD  acetaminophen (TYLENOL) 500 MG tablet Take 1,000 mg by mouth every 6 (six) hours as needed for moderate pain.    [provider]  APPLE CIDER VINEGAR PO Take 2 capsules by mouth daily.    [provider]  Cholecalciferol (VITAMIN D3) 2000 UNITS TABS Take 4,000 Units by mouth daily.     [provider]  diltiazem (CARDIZEM CD) 120 MG 24 hr capsule Take 1 capsule (120 mg total) by mouth 2 (two) times daily. 09/13/22   Novella Olive, FNP  dronedarone (MULTAQ) 400 MG tablet Take 1 tablet (400 mg total) by mouth 2 (two) times daily with a meal. 06/11/22   Newman Nip, NP  fluticasone (FLONASE) 50 MCG/ACT nasal spray Place 1  spray into both nostrils at bedtime.    [provider]  fluticasone-salmeterol (ADVAIR) 100-50 MCG/ACT AEPB Inhale 1 puff into the lungs 2 (two) times daily. 06/25/22   de [place], Raymond J, MD  furosemide (LASIX) 20 MG tablet Take 1 tablet (20 mg total) by mouth daily. Take 2 tabs daily for 3 days, then reduce dose to one tablet daily. Patient not taking: Reported on 12/30/2022 09/03/22   Quintella Reichert, MD  hydrochlorothiazide (HYDRODIURIL) 25 MG tablet Take 25 mg by mouth daily.    [provider]  HYDROcodone-acetaminophen (NORCO/VICODIN) 5-325 MG tablet Take 1 tablet by mouth every 6 (six) hours as needed for moderate pain or severe pain. 01/13/23 01/13/24  McKenzie, Eilene Ghazi, PA-C  loratadine (CLARITIN) 10 MG tablet Take 10 mg by mouth daily.    [provider]  Magnesium 250 MG TABS Take 250 mg by mouth 2 (two) times daily.    [provider]  methocarbamol (ROBAXIN) 750 MG tablet Take 1 tablet (750 mg total) by mouth every 6 (six) hours as needed for muscle spasms. 01/13/23   McKenzie, Eilene Ghazi, PA-C  metoprolol tartrate (LOPRESSOR) 25 MG tablet Take 1 tablet (25 mg total) by mouth 2 (two) times daily. Patient taking differently: Take 37.5 mg by mouth 2 (two) times daily. 07/26/22   de [place], Raymond  J, MD  Multiple Vitamins-Minerals (MULTIVITAMIN ADULT) CHEW Chew 2 each by mouth daily.    [provider]  tamsulosin (FLOMAX) 0.4 MG CAPS capsule TAKE 1 CAPSULE BY MOUTH EVERYDAY AT BEDTIME 01/13/23   de [place], Buren Kos, MD  trolamine salicylate (ASPERCREME) 10 % cream Apply 1 application topically as needed for muscle pain.    [provider]      Allergies    Lipitor [atorvastatin]    Review of Systems   Review of Systems  Physical Exam Updated Vital Signs BP (!) 142/60   Pulse 82   Temp 98 F (36.7 C) (Oral)   Resp 17   Ht 6\' 1"  (1.854 m)   Wt (!) 149.7 kg   SpO2 100%   BMI 43.54 kg/m  Physical Exam Constitutional:      General: He  is not in acute distress.    Appearance: He is obese.  HENT:     Head: Normocephalic and atraumatic.  Eyes:     Conjunctiva/sclera: Conjunctivae normal.     Pupils: Pupils are equal, round, and reactive to light.  Neck:     Comments: C-spine collar in place Cardiovascular:     Rate and Rhythm: Normal rate and regular rhythm.  Pulmonary:     Effort: Pulmonary effort is normal. No respiratory distress.  Abdominal:     General: There is no distension.     Tenderness: There is no abdominal tenderness.  Skin:    General: Skin is warm and dry.  Neurological:     General: No focal deficit present.     Mental Status: He is alert. Mental status is at baseline.  Psychiatric:        Mood and Affect: Mood normal.        Behavior: Behavior normal.     ED Results / Procedures / Treatments   Labs (all labs ordered are listed, but only abnormal results are displayed) Labs Reviewed  BASIC METABOLIC PANEL - Abnormal; Notable for the following components:      Result Value   BUN 25 (*)    All other components within normal limits  CBC WITH DIFFERENTIAL/PLATELET - Abnormal; Notable for the following components:   Abs Immature Granulocytes 0.09 (*)    All other components within normal limits  D-DIMER, QUANTITATIVE  BRAIN NATRIURETIC PEPTIDE  CBC WITH DIFFERENTIAL/PLATELET  TROPONIN I (HIGH SENSITIVITY)    EKG EKG Interpretation  Date/Time:  Thursday January 20 2023 09:43:38 EDT Ventricular Rate:  71 PR Interval:  131 QRS Duration: 158 QT Interval:  431 QTC Calculation: 469 R Axis:   -39 Text Interpretation: Sinus rhythm Atrial premature complex Right bundle branch block Left ventricular hypertrophy Confirmed by Alvester Chou [phone]) on 01/20/2023 10:48:04 AM  Radiology DG Chest 2 View  Result Date: 01/20/2023 CLINICAL DATA:  Pneumonia EXAM: CHEST - 2 VIEW COMPARISON:  01/16/2023 FINDINGS: No consolidation, pneumothorax or effusion. No edema. Normal cardiopericardial silhouette.  Calcified aorta. Degenerative changes are seen along the spine. Fixation hardware along the lower cervical spine. IMPRESSION: No acute cardiopulmonary disease. Electronically Signed   By: Karen Kays M.D.   On: 01/20/2023 10:48    Procedures Procedures    Medications Ordered in ED Medications  albuterol (PROVENTIL) (2.5 MG/3ML) 0.083% nebulizer solution 2.5 mg (2.5 mg Nebulization Given 01/20/23 0956)  ipratropium-albuterol (DUONEB) 0.5-2.5 (3) MG/3ML nebulizer solution 3 mL (3 mLs Nebulization Given 01/20/23 1914)    ED Course/ Medical Decision Making/ A&P Clinical Course as of 01/20/23 1519  Thu Jan 20, 2023  1331 Patient reassessed, has remained stable on room air, continues to cough up phlegm from his upper airway.  I do recommend Mucinex which may help thin out his secretions.  Patient reports he just finished amoxicillin for potential dental infection, I really do not see any indication for further antibiotics.  I do not see evidence of strep pharyngitis on oropharyngeal exam.  Or tonsillitis, or Ludwig's angina, or deep space infection.  Because he has been having persistent pain with swallowing since his surgery do recommend he follow-up with his spine surgeon [MT]    Clinical Course User Index [MT] Zeev Deakins, Kermit Balo, MD                             Medical Decision Making Amount and/or Complexity of Data Reviewed Labs: ordered. Radiology: ordered.  Risk OTC drugs.   This patient presents to the ED with concern for shortness of breath. This involves an extensive number of treatment options, and is a complaint that carries with it a high risk of complications and morbidity.  The differential diagnosis includes pneumonia including aspiration versus pleural effusion versus congestive heart failure versus anemia versus PE versus other  Co-morbidities that complicate the patient evaluation: Patient off of anticoagulation, at high risk of thromboembolic events, recently  postoperative.  Also with history congestive heart failure at high risk of exacerbation.  Additional history obtained from the patient's wife at bedside  External records from outside source obtained and reviewed including last echo 09/28/22 with EF 60-65% with mild aortic root diltation to 40 mm  I ordered and personally interpreted labs.  The pertinent results include: No emergent findings.  D-dimer negative.  Troponin and BNP unremarkable.  No acute anemia.  No leukocytosis  I ordered imaging studies including x-ray of the chest I independently visualized and interpreted imaging which showed no emergent findings I agree with the radiologist interpretation  The patient was maintained on a cardiac monitor.  I personally viewed and interpreted the cardiac monitored which showed an underlying rhythm of: A-fib that is rate controlled  Per my interpretation the patient's ECG shows A-fib with no acute ischemic findings  DuoNebs were given by respiratory therapist upon RT's initial assessment.  I did not appreciate any audible or persistent wheezing to recommend continued nebulizer treatments.  I have reviewed the patients home medicines and have made adjustments as needed  Test Considered: Low suspicion for acute PE in this clinical setting, also doubt deep space neck infection.  No indication for CT imaging at this time   After the interventions noted above, I reevaluated the patient and found that they have: stayed the same   Dispostion:  After consideration of the diagnostic results and the patients response to treatment, I feel that the patent would benefit from outpatient follow-up.  I did recommend Mucinex for his thicker phlegm, which appears to be mostly upper airway involvement..         Final Clinical Impression(s) / ED Diagnoses Final diagnoses:  Odynophagia    Rx / DC Orders ED Discharge Orders          Ordered    guaiFENesin (MUCINEX) 600 MG 12 hr tablet  2 times  daily PRN        01/20/23 1333              Terald Sleeper, MD 01/20/23 1519

## 2023-01-20 NOTE — ED Notes (Signed)
blood w temp label sent to lab to hold

## 2023-01-21 ENCOUNTER — Encounter (HOSPITAL_COMMUNITY): Payer: Self-pay | Admitting: Orthopedic Surgery

## 2023-01-21 DIAGNOSIS — Z981 Arthrodesis status: Secondary | ICD-10-CM | POA: Diagnosis not present

## 2023-01-21 DIAGNOSIS — M4802 Spinal stenosis, cervical region: Secondary | ICD-10-CM | POA: Diagnosis not present

## 2023-01-24 ENCOUNTER — Encounter (HOSPITAL_COMMUNITY): Payer: Self-pay | Admitting: Orthopedic Surgery

## 2023-01-28 DIAGNOSIS — M5412 Radiculopathy, cervical region: Secondary | ICD-10-CM | POA: Diagnosis not present

## 2023-01-28 DIAGNOSIS — Z09 Encounter for follow-up examination after completed treatment for conditions other than malignant neoplasm: Secondary | ICD-10-CM | POA: Diagnosis not present

## 2023-02-01 ENCOUNTER — Other Ambulatory Visit: Payer: Self-pay

## 2023-02-01 ENCOUNTER — Ambulatory Visit (INDEPENDENT_AMBULATORY_CARE_PROVIDER_SITE_OTHER): Payer: Medicare HMO | Admitting: Family Medicine

## 2023-02-01 ENCOUNTER — Ambulatory Visit (HOSPITAL_BASED_OUTPATIENT_CLINIC_OR_DEPARTMENT_OTHER): Payer: Medicare HMO | Attending: Orthopaedic Surgery | Admitting: Physical Therapy

## 2023-02-01 ENCOUNTER — Encounter (HOSPITAL_BASED_OUTPATIENT_CLINIC_OR_DEPARTMENT_OTHER): Payer: Self-pay | Admitting: Family Medicine

## 2023-02-01 ENCOUNTER — Encounter (HOSPITAL_BASED_OUTPATIENT_CLINIC_OR_DEPARTMENT_OTHER): Payer: Self-pay | Admitting: Physical Therapy

## 2023-02-01 VITALS — BP 129/83 | HR 85 | Ht 73.0 in | Wt 311.0 lb

## 2023-02-01 DIAGNOSIS — I10 Essential (primary) hypertension: Secondary | ICD-10-CM

## 2023-02-01 DIAGNOSIS — I480 Paroxysmal atrial fibrillation: Secondary | ICD-10-CM | POA: Diagnosis not present

## 2023-02-01 DIAGNOSIS — R293 Abnormal posture: Secondary | ICD-10-CM | POA: Diagnosis not present

## 2023-02-01 DIAGNOSIS — M75101 Unspecified rotator cuff tear or rupture of right shoulder, not specified as traumatic: Secondary | ICD-10-CM | POA: Insufficient documentation

## 2023-02-01 DIAGNOSIS — M6281 Muscle weakness (generalized): Secondary | ICD-10-CM | POA: Diagnosis not present

## 2023-02-01 DIAGNOSIS — M25511 Pain in right shoulder: Secondary | ICD-10-CM | POA: Insufficient documentation

## 2023-02-01 DIAGNOSIS — G8929 Other chronic pain: Secondary | ICD-10-CM | POA: Insufficient documentation

## 2023-02-01 NOTE — Assessment & Plan Note (Addendum)
Patient did have recent surgery for which Eliquis was held. Unfortunately, it seems that he has not restarted the Eliquis.  This was to be held for 3 days before procedure and ultimately resumed thereafter.  He does continue with diltiazem, Multaq, metoprolol.  No recent issues with dizziness, lightheadedness, palpitations. On exam today, patient in no acute distress, vital signs stable.  Cardiovascular exam with regular rate and rhythm. Patient appears to be in normal sinus rhythm in office today.  Recommend continuing with current medications.  Advised on resuming Eliquis He does have appointment with cardiology upcoming in a few weeks, recommend continuing with this scheduled appointment

## 2023-02-01 NOTE — Patient Instructions (Signed)
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
## 2023-02-01 NOTE — Progress Notes (Signed)
    Procedures performed today:    None.  Independent interpretation of notes and tests performed by another provider:   None.  Brief History, Exam, Impression, and Recommendations:    BP 129/83 (BP Location: Left Arm, Patient Position: Sitting, Cuff Size: Large)   Pulse 85   Ht 6\' 1"  (1.854 m)   Wt (!) 311 lb (141.1 kg)   SpO2 98%   BMI 41.03 kg/m   PAF (paroxysmal atrial fibrillation) (HCC) Assessment & Plan: Patient did have recent surgery for which Eliquis was held. Unfortunately, it seems that he has not restarted the Eliquis.  This was to be held for 3 days before procedure and ultimately resumed thereafter.  He does continue with diltiazem, Multaq, metoprolol.  No recent issues with dizziness, lightheadedness, palpitations. On exam today, patient in no acute distress, vital signs stable.  Cardiovascular exam with regular rate and rhythm. Patient appears to be in normal sinus rhythm in office today.  Recommend continuing with current medications.  Advised on resuming Eliquis He does have appointment with cardiology upcoming in a few weeks, recommend continuing with this scheduled appointment   Primary hypertension Assessment & Plan: Blood pressure appropriate in office today.  No current concerns related chest pain or headaches.  Continues to follow with cardiology - appt in July upcoming.  Has been taking medications as prescribed by cardiology Recommend continue with current medications, no changes to be made today Recommend intermittent monitoring of blood pressure at home, DASH diet   Return in about 3 months (around 05/04/2023) for hypertension, med check. Discuss possible neuropathy in feet next visit   ___________________________________________ Essica Kiker de [place], MD, ABFM, CAQSM Primary Care and Sports Medicine [facility]

## 2023-02-01 NOTE — Assessment & Plan Note (Signed)
Blood pressure appropriate in office today.  No current concerns related chest pain or headaches.  Continues to follow with cardiology - appt in July upcoming.  Has been taking medications as prescribed by cardiology Recommend continue with current medications, no changes to be made today Recommend intermittent monitoring of blood pressure at home, DASH diet

## 2023-02-03 DIAGNOSIS — G4733 Obstructive sleep apnea (adult) (pediatric): Secondary | ICD-10-CM | POA: Diagnosis not present

## 2023-02-07 DIAGNOSIS — R69 Illness, unspecified: Secondary | ICD-10-CM | POA: Diagnosis not present

## 2023-02-14 ENCOUNTER — Ambulatory Visit (HOSPITAL_BASED_OUTPATIENT_CLINIC_OR_DEPARTMENT_OTHER): Payer: Medicare HMO | Attending: Orthopaedic Surgery | Admitting: Physical Therapy

## 2023-02-14 ENCOUNTER — Encounter (HOSPITAL_BASED_OUTPATIENT_CLINIC_OR_DEPARTMENT_OTHER): Payer: Self-pay | Admitting: Physical Therapy

## 2023-02-14 DIAGNOSIS — M25511 Pain in right shoulder: Secondary | ICD-10-CM | POA: Diagnosis not present

## 2023-02-14 DIAGNOSIS — M6281 Muscle weakness (generalized): Secondary | ICD-10-CM | POA: Diagnosis not present

## 2023-02-14 DIAGNOSIS — R293 Abnormal posture: Secondary | ICD-10-CM | POA: Insufficient documentation

## 2023-02-14 DIAGNOSIS — G8929 Other chronic pain: Secondary | ICD-10-CM | POA: Diagnosis not present

## 2023-02-14 NOTE — Therapy (Signed)
OUTPATIENT PHYSICAL THERAPY EVALUATION   Patient Name: Victor Medina MRN: [id number] DOB:04-11-1946, 77 y.o., male Today's Date: 02/14/2023  END OF SESSION:  PT End of Session - 02/14/23 1620     Visit Number 2    Number of Visits 17    Date for PT Re-Evaluation 04/02/23    Authorization Type [employer]    PT Start Time 1536    PT Stop Time 1615    PT Time Calculation (min) 39 min    Activity Tolerance Patient tolerated treatment well;No increased pain    Behavior During Therapy WFL for tasks assessed/performed              Past Medical History:  Diagnosis Date   Acquired dilation of ascending aorta and aortic root (HCC)    41 mm and 40 mm respectively   Arthritis    Asthma    Cancer (HCC)    mild skin   Chronic diastolic CHF (congestive heart failure) (HCC)    GERD (gastroesophageal reflux disease)    Hypertension    Morbid obesity (HCC)    OSA (obstructive sleep apnea)    PAF (paroxysmal atrial fibrillation) (HCC) 08/14/2015   s/p TEE/DCCV to NSR;  denies palplita    RBBB    atrial fib on cardizem   Past Surgical History:  Procedure Laterality Date   ANTERIOR CERVICAL DECOMP/DISCECTOMY FUSION N/A 01/13/2023   Procedure: CERVICAL 4- CERVICAL 5, CERVICAL 5- CERVICAL 6 ANTERIOR CERVICAL DECOMPRESSION FUSION WITH INSTRUMENTATION AND ALLOGRAFT;  Surgeon: Estill Bamberg, MD;  Location: [facility] OR;  Service: Orthopedics;  Laterality: N/A;   CARDIOVERSION N/A 05/19/2015   Procedure: CARDIOVERSION;  Surgeon: Pricilla Riffle, MD;  Location: [facility] ENDOSCOPY;  Service: Cardiovascular;  Laterality: N/A;   CARDIOVERSION N/A 12/17/2021   Procedure: CARDIOVERSION;  Surgeon: Pricilla Riffle, MD;  Location: [facility] ENDOSCOPY;  Service: Cardiovascular;  Laterality: N/A;   CATARACT EXTRACTION W/ INTRAOCULAR LENS IMPLANT Right    CHOLECYSTECTOMY N/A 12/01/2012   Procedure: LAPAROSCOPIC CHOLECYSTECTOMY;  Surgeon: Cherylynn Ridges, MD;  Location: [facility] OR;  Service: General;  Laterality: N/A;   COLONOSCOPY  WITH PROPOFOL N/A 07/13/2019   Procedure: COLONOSCOPY WITH PROPOFOL;  Surgeon: Jeani Hawking, MD;  Location: [facility] ENDOSCOPY;  Service: Endoscopy;  Laterality: N/A;   MASS EXCISION Right 02/20/2018   Procedure: RIGHT THUMB EXCISION MASS AND DEBRIDEMENT DISTAL INTRPHLANGEAL JOINT;  Surgeon: Betha Loa, MD;  Location: [facility];  Service: Orthopedics;  Laterality: Right;   POLYPECTOMY  07/13/2019   Procedure: POLYPECTOMY;  Surgeon: Jeani Hawking, MD;  Location: [facility] ENDOSCOPY;  Service: Endoscopy;;   right knee     meniscus removal 6 years ago   SHOULDER SURGERY Left    Rotator Cuff Repair   TEE WITHOUT CARDIOVERSION N/A 05/19/2015   Procedure: TRANSESOPHAGEAL ECHOCARDIOGRAM (TEE);  Surgeon: Pricilla Riffle, MD;  Location: [facility] ENDOSCOPY;  Service: Cardiovascular;  Laterality: N/A;   Patient Active Problem List   Diagnosis Date Noted   Cough 07/08/2022   Wellness examination 01/19/2022   Persistent atrial fibrillation (HCC) 11/27/2021   Bilateral knee pain 09/29/2021   Hyperlipidemia 09/20/2018   PAF (paroxysmal atrial fibrillation) (HCC) 08/14/2015   Acquired dilation of ascending aorta and aortic root (HCC) 10/26/2013   Edema of extremities 10/26/2013   HTN (hypertension) 10/26/2013   Chronic diastolic CHF (congestive heart failure) (HCC)    RBBB    Obstructive sleep apnea 08/16/2013   Morbid obesity (HCC) 08/16/2013   Rotator cuff disorder 08/16/2013   Cholecystitis,  acute s/p laparoscopic cholecystectomy 12/01/12 12/02/2012     REFERRING PROVIDER:  Huel Cote, MD    REFERRING DIAG:  M75.101 (ICD-10-CM) - Nontraumatic tear of right rotator cuff, unspecified tear extent    Rationale for Evaluation and Treatment: Rehabilitation  THERAPY DIAG:  Chronic right shoulder pain  Muscle weakness (generalized)  Abnormal posture  ONSET DATE: about 2 years ago   SUBJECTIVE:                                                                                                                                                                                            SUBJECTIVE STATEMENT: Pt is 4 weeks and 4 days s/p C4-6 ACDF. Pt states he has had other health issues going on.  Pt needs a route canal though is unable to get it done due to his recent cervical surgery.  Pt states his sinuses are backed up due to the drainage.  Pt is using a bone stimulator for 4 hours per day.   Pt states he has done the wall walk exercise and scapular retractions though not the rest of his HEP.  Pt reports he is able to move his R UE much better when he lies on his L side.  He states he can do almost anything with R UE if he is lying on his L side.     Arrived in a soft c-collar through July 19.    PERTINENT HISTORY:  C4-6 ACDF 01/13/23 A-fib L rotator cuff tear and surgery at least 10 years ago  PAIN: 0/10 R shoulder and cervical pain   PRECAUTIONS:  ACDF 01/13/23  WEIGHT BEARING RESTRICTIONS:  Yes NWB and 10 lb due to cervical surgery  FALLS:  Has patient fallen in last 6 months? Yes. Number of falls 1  PLOF:  Independent  PATIENT GOALS:  Back to gym, back to skeet shooting   OBJECTIVE:   DIAGNOSTIC FINDINGS:  Rt GHJ MRI 11/29/22: IMPRESSION: 1. Moderate tendinosis of the supraspinatus tendon with a tiny insertional interstitial tear. 2. Moderate tendinosis of the infraspinatus tendon. 3. Moderate tendinosis of the intra-articular portion of the long head of the biceps tendon. 4. Partial-thickness cartilage loss of the glenohumeral joint. CERVICAL Spine: IMPRESSION: 1. C5-C6 moderate spinal canal stenosis with moderate to severe right and mild-to-moderate left neural foraminal narrowing. 2. C4-C5 mild spinal canal stenosis with moderate right and mild left neural foraminal narrowing. 3. C6-C7 mild spinal canal stenosis with mild-to- moderate left and mild right neural foraminal narrowing.   HAND DOMINANCE:  Right    TREATMENT:  Reviewed response to prior Rx, PMHx, HEP compliance, and pain level. Reviewed HEP and performed HEP. Pt performed:  Scap retraction x 10 reps  Supine wand flexion 2 x 10 reps  S/L shoulder abd   S/L ER x 10 reps  Supine ER approx 12 reps  Standing shoulder wall walks in flexion  Pt received R shoulder flexion and scaption PROM in supine.    PATIENT EDUCATION:  Education details: Anatomy of condition, POC, HEP, exercise form/rationale.  Person educated: Patient Education method: Explanation, Demonstration, Tactile cues, Verbal cues, and Handouts Education comprehension: verbalized understanding, returned demonstration, verbal cues required, tactile cues required, and needs further education  HOME EXERCISE PROGRAM: [id number] (emailed)   ASSESSMENT:  CLINICAL IMPRESSION: Patient presents to Rx wearing soft collar.  He has not been performing his home exercises except the wall walks and scap retractions some.  PT reviewed HEP and went through the exercise program with him.  PT instructed pt in correct form and positioning with exercises.  PT monitored cevical positioning with all exercises to make sure he was in a good position and not putting stress on cervical.  Pt had no cervical pain or discomfort with any of the exercises.  Pt performed shoulder A/AAROM exercises in a gravity minimized position except was able to perform S/L ER well through a good range of motion.  He has good shoulder ER AROM.  Pt had some shoulder pain toward the end of the set with S/L ER though no pain with supine shoulder ER.  Pt is limited with shoulder wall walks in flexion.  He demonstrated how he was doing that exercise at home which is scaption on the wall.  He had improved ease with and better performance with standing scaption wall walks than flexion wall walks.  He responded well to Rx having no c/o's and  reporting no shoulder and no cervical pain after Rx.    OBJECTIVE IMPAIRMENTS: decreased activity tolerance, decreased ROM, decreased strength, increased muscle spasms, impaired sensation, impaired UE functional use, improper body mechanics, postural dysfunction, and pain.   ACTIVITY LIMITATIONS: carrying, lifting, bed mobility, bathing, dressing, reach over head, and hygiene/grooming  PARTICIPATION LIMITATIONS: meal prep, cleaning, driving, and community activity  PERSONAL FACTORS:  see PMH  are also affecting patient's functional outcome.   REHAB POTENTIAL: Good  CLINICAL DECISION MAKING: Evolving/moderate complexity  EVALUATION COMPLEXITY: Moderate   GOALS: Goals reviewed with patient? Yes  SHORT TERM GOALS: Target date: 7/20  Pull a towel behind back with minimal to no difficulty Baseline: Goal status: INITIAL  2.  Increase AROM flexion against gravity by at least 10 deg Baseline:  Goal status: INITIAL    LONG TERM GOALS: Target date: POC date  Lift 10lb to return to [facility] shooting (fall) Baseline:  Goal status: INITIAL  2.  AROM to overhead to brush hair Baseline:  Goal status: INITIAL  3.  Meet FOTO goal Baseline:  Goal status: INITIAL  4.  Straight plane MMT to at least 75% of opp UE Baseline:  Goal status: INITIAL  5.  Return to weight training in gym with proper form Baseline:  Goal status: INITIAL    PLAN:  PT FREQUENCY: 1-2x/week  PT DURATION: 8 weeks  PLANNED INTERVENTIONS: Therapeutic exercises, Therapeutic activity, Neuromuscular re-education, Patient/Family education, Self Care, Joint mobilization, Aquatic Therapy, Dry Needling, Electrical stimulation, Spinal mobilization, Cryotherapy, Moist heat, scar mobilization, Taping, Ultrasound, Ionotophoresis 4mg /ml Dexamethasone, Manual therapy, and Re-evaluation.  PLAN FOR NEXT SESSION: AAROM in gravity reduced position  Audie Clear III PT, DPT 02/14/23 5:59 PM

## 2023-02-15 ENCOUNTER — Other Ambulatory Visit (HOSPITAL_BASED_OUTPATIENT_CLINIC_OR_DEPARTMENT_OTHER): Payer: Self-pay | Admitting: Family Medicine

## 2023-02-17 ENCOUNTER — Ambulatory Visit (HOSPITAL_BASED_OUTPATIENT_CLINIC_OR_DEPARTMENT_OTHER): Payer: Medicare HMO

## 2023-02-17 ENCOUNTER — Encounter (HOSPITAL_BASED_OUTPATIENT_CLINIC_OR_DEPARTMENT_OTHER): Payer: Self-pay

## 2023-02-17 DIAGNOSIS — G8929 Other chronic pain: Secondary | ICD-10-CM

## 2023-02-17 DIAGNOSIS — R293 Abnormal posture: Secondary | ICD-10-CM | POA: Diagnosis not present

## 2023-02-17 DIAGNOSIS — M6281 Muscle weakness (generalized): Secondary | ICD-10-CM | POA: Diagnosis not present

## 2023-02-17 DIAGNOSIS — M25511 Pain in right shoulder: Secondary | ICD-10-CM | POA: Diagnosis not present

## 2023-02-17 NOTE — Therapy (Signed)
OUTPATIENT PHYSICAL THERAPY TREATMENT   Patient Name: Victor Medina MRN: [id number] DOB:1945-08-14, 77 y.o., male Today's Date: 02/17/2023  END OF SESSION:  PT End of Session - 02/17/23 1522     Visit Number 3    Number of Visits 17    Date for PT Re-Evaluation 04/02/23    Authorization Type [employer]    PT Start Time 1520    PT Stop Time 1552    PT Time Calculation (min) 32 min    Activity Tolerance Patient tolerated treatment well    Behavior During Therapy WFL for tasks assessed/performed               Past Medical History:  Diagnosis Date   Acquired dilation of ascending aorta and aortic root (HCC)    41 mm and 40 mm respectively   Arthritis    Asthma    Cancer (HCC)    mild skin   Chronic diastolic CHF (congestive heart failure) (HCC)    GERD (gastroesophageal reflux disease)    Hypertension    Morbid obesity (HCC)    OSA (obstructive sleep apnea)    PAF (paroxysmal atrial fibrillation) (HCC) 08/14/2015   s/p TEE/DCCV to NSR;  denies palplita    RBBB    atrial fib on cardizem   Past Surgical History:  Procedure Laterality Date   ANTERIOR CERVICAL DECOMP/DISCECTOMY FUSION N/A 01/13/2023   Procedure: CERVICAL 4- CERVICAL 5, CERVICAL 5- CERVICAL 6 ANTERIOR CERVICAL DECOMPRESSION FUSION WITH INSTRUMENTATION AND ALLOGRAFT;  Surgeon: Estill Bamberg, MD;  Location: [facility] OR;  Service: Orthopedics;  Laterality: N/A;   CARDIOVERSION N/A 05/19/2015   Procedure: CARDIOVERSION;  Surgeon: Pricilla Riffle, MD;  Location: [facility] ENDOSCOPY;  Service: Cardiovascular;  Laterality: N/A;   CARDIOVERSION N/A 12/17/2021   Procedure: CARDIOVERSION;  Surgeon: Pricilla Riffle, MD;  Location: [facility] ENDOSCOPY;  Service: Cardiovascular;  Laterality: N/A;   CATARACT EXTRACTION W/ INTRAOCULAR LENS IMPLANT Right    CHOLECYSTECTOMY N/A 12/01/2012   Procedure: LAPAROSCOPIC CHOLECYSTECTOMY;  Surgeon: Cherylynn Ridges, MD;  Location: [facility] OR;  Service: General;  Laterality: N/A;   COLONOSCOPY WITH PROPOFOL N/A  07/13/2019   Procedure: COLONOSCOPY WITH PROPOFOL;  Surgeon: Jeani Hawking, MD;  Location: [facility] ENDOSCOPY;  Service: Endoscopy;  Laterality: N/A;   MASS EXCISION Right 02/20/2018   Procedure: RIGHT THUMB EXCISION MASS AND DEBRIDEMENT DISTAL INTRPHLANGEAL JOINT;  Surgeon: Betha Loa, MD;  Location: [facility];  Service: Orthopedics;  Laterality: Right;   POLYPECTOMY  07/13/2019   Procedure: POLYPECTOMY;  Surgeon: Jeani Hawking, MD;  Location: [facility] ENDOSCOPY;  Service: Endoscopy;;   right knee     meniscus removal 6 years ago   SHOULDER SURGERY Left    Rotator Cuff Repair   TEE WITHOUT CARDIOVERSION N/A 05/19/2015   Procedure: TRANSESOPHAGEAL ECHOCARDIOGRAM (TEE);  Surgeon: Pricilla Riffle, MD;  Location: [facility] ENDOSCOPY;  Service: Cardiovascular;  Laterality: N/A;   Patient Active Problem List   Diagnosis Date Noted   Cough 07/08/2022   Wellness examination 01/19/2022   Persistent atrial fibrillation (HCC) 11/27/2021   Bilateral knee pain 09/29/2021   Hyperlipidemia 09/20/2018   PAF (paroxysmal atrial fibrillation) (HCC) 08/14/2015   Acquired dilation of ascending aorta and aortic root (HCC) 10/26/2013   Edema of extremities 10/26/2013   HTN (hypertension) 10/26/2013   Chronic diastolic CHF (congestive heart failure) (HCC)    RBBB    Obstructive sleep apnea 08/16/2013   Morbid obesity (HCC) 08/16/2013   Rotator cuff disorder 08/16/2013   Cholecystitis, acute  s/p laparoscopic cholecystectomy 12/01/12 12/02/2012     REFERRING PROVIDER:  Huel Cote, MD    REFERRING DIAG:  M75.101 (ICD-10-CM) - Nontraumatic tear of right rotator cuff, unspecified tear extent    Rationale for Evaluation and Treatment: Rehabilitation  THERAPY DIAG:  Chronic right shoulder pain  Abnormal posture  Muscle weakness (generalized)  ONSET DATE: about 2 years ago   SUBJECTIVE:                                                                                                                                                                                            SUBJECTIVE STATEMENT: Pt denies pain at entry, some minor anterior neck discomfort. "I just need to get rid of this collar." He has been compliant with bone stimulator for 4 hours total per day.   PERTINENT HISTORY:  C4-6 ACDF 01/13/23 A-fib L rotator cuff tear and surgery at least 10 years ago  PAIN: 0/10 R shoulder and cervical pain   PRECAUTIONS:  ACDF 01/13/23  WEIGHT BEARING RESTRICTIONS:  Yes NWB and 10 lb due to cervical surgery  FALLS:  Has patient fallen in last 6 months? Yes. Number of falls 1  PLOF:  Independent  PATIENT GOALS:  Back to gym, back to skeet shooting   OBJECTIVE:   DIAGNOSTIC FINDINGS:  Rt GHJ MRI 11/29/22: IMPRESSION: 1. Moderate tendinosis of the supraspinatus tendon with a tiny insertional interstitial tear. 2. Moderate tendinosis of the infraspinatus tendon. 3. Moderate tendinosis of the intra-articular portion of the long head of the biceps tendon. 4. Partial-thickness cartilage loss of the glenohumeral joint. CERVICAL Spine: IMPRESSION: 1. C5-C6 moderate spinal canal stenosis with moderate to severe right and mild-to-moderate left neural foraminal narrowing. 2. C4-C5 mild spinal canal stenosis with moderate right and mild left neural foraminal narrowing. 3. C6-C7 mild spinal canal stenosis with mild-to- moderate left and mild right neural foraminal narrowing.   HAND DOMINANCE:  Right    TREATMENT:                                                                                                                               -  Scap retraction-5" 2x10 Supine wand flexion 2x10 Supine active flexion 2x10 Sidelying ER 2x10 Pulleys flexion and scaption 2' ea Theraband row RTB x20 Doorway stretch 10sec x3 (cues for cervical position)    Reviewed response to prior Rx, PMHx, HEP compliance, and pain level. Reviewed HEP and performed HEP. Pt performed:  Scap  retraction 2x 10 reps- 5" hold  Supine wand flexion 2 x 10 reps  S/L shoulder abd   S/L ER x 10 reps  Supine ER approx 12 reps  Standing shoulder wall walks in flexion  Pt received R shoulder flexion and scaption PROM in supine.    PATIENT EDUCATION:  Education details: Anatomy of condition, POC, HEP, exercise form/rationale.  Person educated: Patient Education method: Explanation, Demonstration, Tactile cues, Verbal cues, and Handouts Education comprehension: verbalized understanding, returned demonstration, verbal cues required, tactile cues required, and needs further education  HOME EXERCISE PROGRAM: [id number] (emailed)   ASSESSMENT:  CLINICAL IMPRESSION: Pt exhibits difficulty with shoulder AAROM and AROM due to weakness. He was able to complete supine active flexion today; however. No c/o pain with plinth exercises, but he did have mild pain with scaption pulley exercise. Pt tender to proximal biceps upon palpation. No complaints of neck pain during session.  Will continue to progress as tolerated with R shoulder ROM and strengthening.   OBJECTIVE IMPAIRMENTS: decreased activity tolerance, decreased ROM, decreased strength, increased muscle spasms, impaired sensation, impaired UE functional use, improper body mechanics, postural dysfunction, and pain.   ACTIVITY LIMITATIONS: carrying, lifting, bed mobility, bathing, dressing, reach over head, and hygiene/grooming  PARTICIPATION LIMITATIONS: meal prep, cleaning, driving, and community activity  PERSONAL FACTORS:  see PMH  are also affecting patient's functional outcome.   REHAB POTENTIAL: Good  CLINICAL DECISION MAKING: Evolving/moderate complexity  EVALUATION COMPLEXITY: Moderate   GOALS: Goals reviewed with patient? Yes  SHORT TERM GOALS: Target date: 7/20  Pull a towel behind back with minimal to no difficulty Baseline: Goal status: INITIAL  2.  Increase AROM flexion against gravity by at least 10  deg Baseline:  Goal status: INITIAL    LONG TERM GOALS: Target date: POC date  Lift 10lb to return to [facility] shooting (fall) Baseline:  Goal status: INITIAL  2.  AROM to overhead to brush hair Baseline:  Goal status: INITIAL  3.  Meet FOTO goal Baseline:  Goal status: INITIAL  4.  Straight plane MMT to at least 75% of opp UE Baseline:  Goal status: INITIAL  5.  Return to weight training in gym with proper form Baseline:  Goal status: INITIAL    PLAN:  PT FREQUENCY: 1-2x/week  PT DURATION: 8 weeks  PLANNED INTERVENTIONS: Therapeutic exercises, Therapeutic activity, Neuromuscular re-education, Patient/Family education, Self Care, Joint mobilization, Aquatic Therapy, Dry Needling, Electrical stimulation, Spinal mobilization, Cryotherapy, Moist heat, scar mobilization, Taping, Ultrasound, Ionotophoresis 4mg /ml Dexamethasone, Manual therapy, and Re-evaluation.  PLAN FOR NEXT SESSION: AAROM in gravity reduced position   [employer], PTA  02/17/23 4:02 PM

## 2023-02-21 ENCOUNTER — Ambulatory Visit (HOSPITAL_BASED_OUTPATIENT_CLINIC_OR_DEPARTMENT_OTHER): Payer: Medicare HMO

## 2023-02-21 ENCOUNTER — Encounter (HOSPITAL_BASED_OUTPATIENT_CLINIC_OR_DEPARTMENT_OTHER): Payer: Self-pay

## 2023-02-21 DIAGNOSIS — M6281 Muscle weakness (generalized): Secondary | ICD-10-CM

## 2023-02-21 DIAGNOSIS — M5412 Radiculopathy, cervical region: Secondary | ICD-10-CM | POA: Diagnosis not present

## 2023-02-21 DIAGNOSIS — G8929 Other chronic pain: Secondary | ICD-10-CM | POA: Diagnosis not present

## 2023-02-21 DIAGNOSIS — R293 Abnormal posture: Secondary | ICD-10-CM

## 2023-02-21 DIAGNOSIS — M25511 Pain in right shoulder: Secondary | ICD-10-CM | POA: Diagnosis not present

## 2023-02-21 NOTE — Therapy (Signed)
OUTPATIENT PHYSICAL THERAPY TREATMENT   Patient Name: Victor Medina MRN: [id number] DOB:1946/02/02, 77 y.o., male Today's Date: 02/21/2023  END OF SESSION:  PT End of Session - 02/21/23 1610     Visit Number 4    Number of Visits 17    Date for PT Re-Evaluation 04/02/23    Authorization Type [employer]    PT Start Time 1518    PT Stop Time 1601    PT Time Calculation (min) 43 min    Activity Tolerance Patient tolerated treatment well    Behavior During Therapy WFL for tasks assessed/performed                Past Medical History:  Diagnosis Date   Acquired dilation of ascending aorta and aortic root (HCC)    41 mm and 40 mm respectively   Arthritis    Asthma    Cancer (HCC)    mild skin   Chronic diastolic CHF (congestive heart failure) (HCC)    GERD (gastroesophageal reflux disease)    Hypertension    Morbid obesity (HCC)    OSA (obstructive sleep apnea)    PAF (paroxysmal atrial fibrillation) (HCC) 08/14/2015   s/p TEE/DCCV to NSR;  denies palplita    RBBB    atrial fib on cardizem   Past Surgical History:  Procedure Laterality Date   ANTERIOR CERVICAL DECOMP/DISCECTOMY FUSION N/A 01/13/2023   Procedure: CERVICAL 4- CERVICAL 5, CERVICAL 5- CERVICAL 6 ANTERIOR CERVICAL DECOMPRESSION FUSION WITH INSTRUMENTATION AND ALLOGRAFT;  Surgeon: Estill Bamberg, MD;  Location: [facility] OR;  Service: Orthopedics;  Laterality: N/A;   CARDIOVERSION N/A 05/19/2015   Procedure: CARDIOVERSION;  Surgeon: Pricilla Riffle, MD;  Location: [facility] ENDOSCOPY;  Service: Cardiovascular;  Laterality: N/A;   CARDIOVERSION N/A 12/17/2021   Procedure: CARDIOVERSION;  Surgeon: Pricilla Riffle, MD;  Location: [facility] ENDOSCOPY;  Service: Cardiovascular;  Laterality: N/A;   CATARACT EXTRACTION W/ INTRAOCULAR LENS IMPLANT Right    CHOLECYSTECTOMY N/A 12/01/2012   Procedure: LAPAROSCOPIC CHOLECYSTECTOMY;  Surgeon: Cherylynn Ridges, MD;  Location: [facility] OR;  Service: General;  Laterality: N/A;   COLONOSCOPY WITH PROPOFOL  N/A 07/13/2019   Procedure: COLONOSCOPY WITH PROPOFOL;  Surgeon: Jeani Hawking, MD;  Location: [facility] ENDOSCOPY;  Service: Endoscopy;  Laterality: N/A;   MASS EXCISION Right 02/20/2018   Procedure: RIGHT THUMB EXCISION MASS AND DEBRIDEMENT DISTAL INTRPHLANGEAL JOINT;  Surgeon: Betha Loa, MD;  Location: [facility];  Service: Orthopedics;  Laterality: Right;   POLYPECTOMY  07/13/2019   Procedure: POLYPECTOMY;  Surgeon: Jeani Hawking, MD;  Location: [facility] ENDOSCOPY;  Service: Endoscopy;;   right knee     meniscus removal 6 years ago   SHOULDER SURGERY Left    Rotator Cuff Repair   TEE WITHOUT CARDIOVERSION N/A 05/19/2015   Procedure: TRANSESOPHAGEAL ECHOCARDIOGRAM (TEE);  Surgeon: Pricilla Riffle, MD;  Location: [facility] ENDOSCOPY;  Service: Cardiovascular;  Laterality: N/A;   Patient Active Problem List   Diagnosis Date Noted   Cough 07/08/2022   Wellness examination 01/19/2022   Persistent atrial fibrillation (HCC) 11/27/2021   Bilateral knee pain 09/29/2021   Hyperlipidemia 09/20/2018   PAF (paroxysmal atrial fibrillation) (HCC) 08/14/2015   Acquired dilation of ascending aorta and aortic root (HCC) 10/26/2013   Edema of extremities 10/26/2013   HTN (hypertension) 10/26/2013   Chronic diastolic CHF (congestive heart failure) (HCC)    RBBB    Obstructive sleep apnea 08/16/2013   Morbid obesity (HCC) 08/16/2013   Rotator cuff disorder 08/16/2013   Cholecystitis,  acute s/p laparoscopic cholecystectomy 12/01/12 12/02/2012     REFERRING PROVIDER:  Huel Cote, MD    REFERRING DIAG:  M75.101 (ICD-10-CM) - Nontraumatic tear of right rotator cuff, unspecified tear extent    Rationale for Evaluation and Treatment: Rehabilitation  THERAPY DIAG:  Chronic right shoulder pain  Muscle weakness (generalized)  Abnormal posture  ONSET DATE: about 2 years ago   SUBJECTIVE:                                                                                                                                                                                            SUBJECTIVE STATEMENT: Pt reports his MD d/c'd him from cervical collar. Pt reports he will be using bone stimulator for another year. "I was able to do bicep curls easier with my R arm the last time I worked out." Pt reports he was also able to adjust his shower head with his R hand.   PERTINENT HISTORY:  C4-6 ACDF 01/13/23 A-fib L rotator cuff tear and surgery at least 10 years ago  PAIN: 0/10 R shoulder and cervical pain   PRECAUTIONS:  ACDF 01/13/23  WEIGHT BEARING RESTRICTIONS:  Yes NWB and 10 lb due to cervical surgery  FALLS:  Has patient fallen in last 6 months? Yes. Number of falls 1  PLOF:  Independent  PATIENT GOALS:  Back to gym, back to skeet shooting   OBJECTIVE:   DIAGNOSTIC FINDINGS:  Rt GHJ MRI 11/29/22: IMPRESSION: 1. Moderate tendinosis of the supraspinatus tendon with a tiny insertional interstitial tear. 2. Moderate tendinosis of the infraspinatus tendon. 3. Moderate tendinosis of the intra-articular portion of the long head of the biceps tendon. 4. Partial-thickness cartilage loss of the glenohumeral joint. CERVICAL Spine: IMPRESSION: 1. C5-C6 moderate spinal canal stenosis with moderate to severe right and mild-to-moderate left neural foraminal narrowing. 2. C4-C5 mild spinal canal stenosis with moderate right and mild left neural foraminal narrowing. 3. C6-C7 mild spinal canal stenosis with mild-to- moderate left and mild right neural foraminal narrowing.   HAND DOMINANCE:  Right    TREATMENT:                                                                                                                               -  PROM R shoulder all planes -Scap retraction-5" x15 Seated dowel press out x10  Supine active flexion 2x10 Sidelying ER 2x10 Sidelying abduction 2x10 Pulleys flexion and scaption 2' ea Bent over row-10lbs 1x10R Finger ladder flexion x7 Standing  press out 2x10 (in front of mirror to watch for UT hiking)     PATIENT EDUCATION:  Education details: Anatomy of condition, POC, HEP, exercise form/rationale.  Person educated: Patient Education method: Explanation, Demonstration, Tactile cues, Verbal cues, and Handouts Education comprehension: verbalized understanding, returned demonstration, verbal cues required, tactile cues required, and needs further education  HOME EXERCISE PROGRAM: [id number] (emailed)   ASSESSMENT:  CLINICAL IMPRESSION: Pt demonstrates mild improvement with AROM in supine today compared to previous session. Working towards adding resistance, though pt not quite strong enough yet. Added seated chest press with dowel, with which tactile cues were required to prevent UT hiking. Able to complete finger ladder flexion with self correction of UT hiking, though this did cause discomfort in posterior arm/shoulder. Educated pt on importance of performing HEP.   OBJECTIVE IMPAIRMENTS: decreased activity tolerance, decreased ROM, decreased strength, increased muscle spasms, impaired sensation, impaired UE functional use, improper body mechanics, postural dysfunction, and pain.   ACTIVITY LIMITATIONS: carrying, lifting, bed mobility, bathing, dressing, reach over head, and hygiene/grooming  PARTICIPATION LIMITATIONS: meal prep, cleaning, driving, and community activity  PERSONAL FACTORS:  see PMH  are also affecting patient's functional outcome.   REHAB POTENTIAL: Good  CLINICAL DECISION MAKING: Evolving/moderate complexity  EVALUATION COMPLEXITY: Moderate   GOALS: Goals reviewed with patient? Yes  SHORT TERM GOALS: Target date: 7/20  Pull a towel behind back with minimal to no difficulty Baseline: Goal status: INITIAL  2.  Increase AROM flexion against gravity by at least 10 deg Baseline:  Goal status: INITIAL    LONG TERM GOALS: Target date: POC date  Lift 10lb to return to [facility] shooting  (fall) Baseline:  Goal status: INITIAL  2.  AROM to overhead to brush hair Baseline:  Goal status: INITIAL  3.  Meet FOTO goal Baseline:  Goal status: INITIAL  4.  Straight plane MMT to at least 75% of opp UE Baseline:  Goal status: INITIAL  5.  Return to weight training in gym with proper form Baseline:  Goal status: INITIAL    PLAN:  PT FREQUENCY: 1-2x/week  PT DURATION: 8 weeks  PLANNED INTERVENTIONS: Therapeutic exercises, Therapeutic activity, Neuromuscular re-education, Patient/Family education, Self Care, Joint mobilization, Aquatic Therapy, Dry Needling, Electrical stimulation, Spinal mobilization, Cryotherapy, Moist heat, scar mobilization, Taping, Ultrasound, Ionotophoresis 4mg /ml Dexamethasone, Manual therapy, and Re-evaluation.  PLAN FOR NEXT SESSION: AAROM in gravity reduced position   [employer], PTA  02/21/23 4:25 PM

## 2023-02-22 ENCOUNTER — Ambulatory Visit: Payer: Medicare HMO | Attending: Cardiology | Admitting: Cardiology

## 2023-02-22 ENCOUNTER — Encounter: Payer: Self-pay | Admitting: Cardiology

## 2023-02-22 VITALS — BP 112/62 | HR 77 | Ht 73.0 in | Wt 322.0 lb

## 2023-02-22 DIAGNOSIS — I480 Paroxysmal atrial fibrillation: Secondary | ICD-10-CM | POA: Diagnosis not present

## 2023-02-22 DIAGNOSIS — I5032 Chronic diastolic (congestive) heart failure: Secondary | ICD-10-CM | POA: Diagnosis not present

## 2023-02-22 DIAGNOSIS — G4733 Obstructive sleep apnea (adult) (pediatric): Secondary | ICD-10-CM | POA: Diagnosis not present

## 2023-02-22 DIAGNOSIS — I10 Essential (primary) hypertension: Secondary | ICD-10-CM

## 2023-02-22 DIAGNOSIS — I77810 Thoracic aortic ectasia: Secondary | ICD-10-CM | POA: Diagnosis not present

## 2023-02-22 DIAGNOSIS — E7800 Pure hypercholesterolemia, unspecified: Secondary | ICD-10-CM

## 2023-02-22 MED ORDER — HYDROCHLOROTHIAZIDE 25 MG PO TABS: 25.0000 mg | ORAL_TABLET | Freq: Every day | ORAL | 3 refills | Status: DC

## 2023-02-22 MED ORDER — DILTIAZEM HCL ER COATED BEADS 120 MG PO CP24: 120.0000 mg | ORAL_CAPSULE | Freq: Two times a day (BID) | ORAL | 3 refills | Status: DC

## 2023-02-22 NOTE — Patient Instructions (Addendum)
Medication Instructions:  Your physician recommends that you continue on your current medications as directed. Please refer to the Current Medication list given to you today.  *If you need a refill on your cardiac medications before your next appointment, please call your pharmacy*  Lab Work: None ordered today.  Testing/Procedures: Your physician has requested that you have an echocardiogram ib February 2025. Echocardiography is a painless test that uses sound waves to create images of your heart. It provides your doctor with information about the size and shape of your heart and how well your heart's chambers and valves are working. This procedure takes approximately one hour. There are no restrictions for this procedure. Please do NOT wear cologne, perfume, aftershave, or lotions (deodorant is allowed). Please arrive 15 minutes prior to your appointment time.  Follow-Up: At [facility], you and your health needs are our priority.  As part of our continuing mission to provide you with exceptional heart care, we have created designated Provider Care Teams.  These Care Teams include your primary Cardiologist (physician) and Advanced Practice Providers (APPs -  Physician Assistants and Nurse Practitioners) who all work together to provide you with the care you need, when you need it.  Your next appointment:   Next available with Pharm D for Lipid Clinic 1 year(s) with Dr. Mayford Knife  The format for your next appointment:   In Person  Provider:   Armanda Magic, MD {

## 2023-02-22 NOTE — Addendum Note (Signed)
Addended by: Franchot Gallo on: 02/22/2023 10:08 AM   Modules accepted: Orders

## 2023-02-22 NOTE — Progress Notes (Addendum)
Cardiology Office Note   Date:  02/22/2023   ID:  Victor Medina, DOB 11-09-1945, MRN [id number]  PCP:  de [place], Raymond J, MD  Cardiologist:  Dr. Mayford Knife, MD   Chief Complaint  Patient presents with   Atrial Fibrillation   Congestive Heart Failure   Hypertension   Sleep Apnea   Hyperlipidemia      History of Present Illness: Victor Medina is a 77 y.o. male with a history of PAF s/p cardioversion in 2016, mildly dilated aortic root, chronic diastolic CHF, chonic LE edema, RBBB, OSA on CPAP.  He underwent an echocardiogram 09/2016 with normal LVEF with normal aortic root.  He is on apixaban 5 mg twice daily for CHA2DS2-VASc score of 2.  He had recurrent A-fib and underwent DCCV in May 2023 but then reverted back to A-fib and was seen in A-fib clinic 04/23/2022.  Beta-blocker was increased and underwent repeat cardioversion in the ER in September 2023.  Follow-up in A-fib clinic 04/27/2022 and at that time he was started on Multaq 400 mg twice daily.    He is here today for followup and is doing well.  He denies any chest pain or pressure, SOB, DOE, PND, orthopnea, dizziness, palpitations or syncope. He has chronic LE edema at times.  He tried the Lasix which did not help and went back to hydrochlorothiazide which works great. He is compliant with his meds and is tolerating meds with no SE.    He is doing well with his PAP device and thinks that he has gotten used to it.  He tolerates the full face mask and feels the pressure is adequate.  Since going on PAP he feels rested in the am and has no significant daytime sleepiness.  He denies any significant mouth or nasal dryness or nasal congestion.  He does not think that he snores.    Past Medical History:  Diagnosis Date   Acquired dilation of ascending aorta and aortic root (HCC)    41 mm and 40 mm respectively   Arthritis    Asthma    Cancer (HCC)    mild skin   Chronic diastolic CHF (congestive heart failure) (HCC)     GERD (gastroesophageal reflux disease)    Hypertension    Morbid obesity (HCC)    OSA (obstructive sleep apnea)    PAF (paroxysmal atrial fibrillation) (HCC) 08/14/2015   s/p TEE/DCCV to NSR;  denies palplita    RBBB    atrial fib on cardizem    Past Surgical History:  Procedure Laterality Date   ANTERIOR CERVICAL DECOMP/DISCECTOMY FUSION N/A 01/13/2023   Procedure: CERVICAL 4- CERVICAL 5, CERVICAL 5- CERVICAL 6 ANTERIOR CERVICAL DECOMPRESSION FUSION WITH INSTRUMENTATION AND ALLOGRAFT;  Surgeon: Estill Bamberg, MD;  Location: [facility] OR;  Service: Orthopedics;  Laterality: N/A;   CARDIOVERSION N/A 05/19/2015   Procedure: CARDIOVERSION;  Surgeon: Pricilla Riffle, MD;  Location: [facility] ENDOSCOPY;  Service: Cardiovascular;  Laterality: N/A;   CARDIOVERSION N/A 12/17/2021   Procedure: CARDIOVERSION;  Surgeon: Pricilla Riffle, MD;  Location: [facility] ENDOSCOPY;  Service: Cardiovascular;  Laterality: N/A;   CATARACT EXTRACTION W/ INTRAOCULAR LENS IMPLANT Right    CHOLECYSTECTOMY N/A 12/01/2012   Procedure: LAPAROSCOPIC CHOLECYSTECTOMY;  Surgeon: Cherylynn Ridges, MD;  Location: [facility] OR;  Service: General;  Laterality: N/A;   COLONOSCOPY WITH PROPOFOL N/A 07/13/2019   Procedure: COLONOSCOPY WITH PROPOFOL;  Surgeon: Jeani Hawking, MD;  Location: [facility] ENDOSCOPY;  Service: Endoscopy;  Laterality: N/A;  MASS EXCISION Right 02/20/2018   Procedure: RIGHT THUMB EXCISION MASS AND DEBRIDEMENT DISTAL INTRPHLANGEAL JOINT;  Surgeon: Betha Loa, MD;  Location: [facility];  Service: Orthopedics;  Laterality: Right;   POLYPECTOMY  07/13/2019   Procedure: POLYPECTOMY;  Surgeon: Jeani Hawking, MD;  Location: [facility] ENDOSCOPY;  Service: Endoscopy;;   right knee     meniscus removal 6 years ago   SHOULDER SURGERY Left    Rotator Cuff Repair   TEE WITHOUT CARDIOVERSION N/A 05/19/2015   Procedure: TRANSESOPHAGEAL ECHOCARDIOGRAM (TEE);  Surgeon: Pricilla Riffle, MD;  Location: [facility] ENDOSCOPY;  Service: Cardiovascular;  Laterality:  N/A;     Current Outpatient Medications  Medication Sig Dispense Refill   acetaminophen (TYLENOL) 500 MG tablet Take 1,000 mg by mouth every 6 (six) hours as needed for moderate pain.     apixaban (ELIQUIS) 5 MG TABS tablet Take 5 mg by mouth 2 (two) times daily.     APPLE CIDER VINEGAR PO Take 2 capsules by mouth daily.     Cholecalciferol (VITAMIN D3) 2000 UNITS TABS Take 4,000 Units by mouth daily.      dronedarone (MULTAQ) 400 MG tablet Take 1 tablet (400 mg total) by mouth 2 (two) times daily with a meal. 30 tablet 0   fluticasone (FLONASE) 50 MCG/ACT nasal spray Place 1 spray into both nostrils at bedtime.     HYDROcodone-acetaminophen (NORCO/VICODIN) 5-325 MG tablet Take 1 tablet by mouth every 6 (six) hours as needed for moderate pain or severe pain. 20 tablet 0   loratadine (CLARITIN) 10 MG tablet Take 10 mg by mouth daily.     Magnesium 250 MG TABS Take 250 mg by mouth 2 (two) times daily.     methocarbamol (ROBAXIN) 750 MG tablet Take 1 tablet (750 mg total) by mouth every 6 (six) hours as needed for muscle spasms. 60 tablet 0   metoprolol tartrate (LOPRESSOR) 25 MG tablet Take 1 tablet (25 mg total) by mouth 2 (two) times daily. 180 tablet 1   Multiple Vitamins-Minerals (MULTIVITAMIN ADULT) CHEW Chew 2 each by mouth daily.     tamsulosin (FLOMAX) 0.4 MG CAPS capsule TAKE 1 CAPSULE BY MOUTH EVERYDAY AT BEDTIME 90 capsule 0   trolamine salicylate (ASPERCREME) 10 % cream Apply 1 application topically as needed for muscle pain.     WIXELA INHUB 100-50 MCG/ACT AEPB INHALE 1 PUFF INTO THE LUNGS TWICE A DAY 60 each 3   diltiazem (CARDIZEM CD) 120 MG 24 hr capsule Take 1 capsule (120 mg total) by mouth 2 (two) times daily. 90 capsule 3   furosemide (LASIX) 20 MG tablet Take 1 tablet (20 mg total) by mouth daily. Take 2 tabs daily for 3 days, then reduce dose to one tablet daily. (Patient not taking: Reported on 02/22/2023) 90 tablet 3   hydrochlorothiazide (HYDRODIURIL) 25 MG tablet Take 1  tablet (25 mg total) by mouth daily. 90 tablet 3   No current facility-administered medications for this visit.    Allergies:   Lipitor [atorvastatin]    Social History:  The patient  reports that he has quit smoking. His smoking use included cigarettes. He has never used smokeless tobacco. He reports that he does not currently use alcohol. He reports that he does not use drugs.   Family History:  The patient's family history includes Heart disease in his sister; Hypotension in his mother.    ROS:  Please see the history of present illness.   Otherwise, review of systems are positive  for none.   All other systems are reviewed and negative.    PHYSICAL EXAM: VS:  BP 112/62   Pulse 77   Ht 6\' 1"  (1.854 m)   Wt (!) 322 lb (146.1 kg)   SpO2 96%   BMI 42.48 kg/m  , BMI Body mass index is 42.48 kg/m.   GEN: Well nourished, well developed in no acute distress HEENT: Normal NECK: No JVD; No carotid bruits LYMPHATICS: No lymphadenopathy CARDIAC:RRR, no murmurs, rubs, gallops RESPIRATORY:  Clear to auscultation without rales, wheezing or rhonchi  ABDOMEN: Soft, non-tender, non-distended MUSCULOSKELETAL:  No edema; No deformity  SKIN: Warm and dry NEUROLOGIC:  Alert and oriented x 3 PSYCHIATRIC:  Normal affect    Recent Labs: 04/25/2022: TSH 4.363 09/15/2022: Magnesium 2.3 01/16/2023: ALT 8 01/20/2023: B Natriuretic Peptide 73.3; BUN 25; Creatinine, Ser 1.09; Hemoglobin 15.0; Platelets 165; Potassium 4.8; Sodium 138   Lipid Panel    Component Value Date/Time   CHOL 180 09/15/2022 1007   TRIG 85 09/15/2022 1007   HDL 52 09/15/2022 1007   CHOLHDL 3.5 09/15/2022 1007   LDLCALC 112 (H) 09/15/2022 1007      Wt Readings from Last 3 Encounters:  02/22/23 (!) 322 lb (146.1 kg)  02/01/23 (!) 311 lb (141.1 kg)  01/20/23 (!) 330 lb (149.7 kg)    ASSESSMENT AND PLAN:  1.  PAF: -S/P DCCV in 2016 and May 2023 -Recurrent A-fib 9/23 and beta-blocker uptitrated but presented to the ER  a few days later and underwent cardioversion -Started on Multaq in September 2023 in A-fib clinic -He is maintaining normal sinus rhythm in the office today and has not had any palpitations or bleeding problems on his apixaban -Continue prescription drug management apixaban 5 mg daily, Cardizem CD 120 mg daily, Multaq 400 mg twice daily, Lopressor 25 mg twice daily with as needed refills  -I have personally reviewed and interpreted outside labs performed by patient's PCP which showed hemoglobin 15, serum creatinine 1.09 and potassium 4.8 on 01/20/2023  2.  Chronic diastolic CHF: -2D echo 01/26/2021 with EF 50 to 55% and grade 1 diastolic dysfunction -He has a chronic lower extremity edema related to morbid obesity -He does not appear volume overloaded today but difficult to determine given his morbid obesity -Continue prescription drug management with Lasix 40 mg daily with as needed refills  3.  Hypertension: -BP is controlled on exam today -Continue drug management with Cardizem CD 120 mg twice daily, HCTZ 25 mg daily and Lopressor 25 mg twice daily with as needed refills  4.  OSA - The patient is tolerating PAP therapy well without any problems. The PAP download performed by his DME was personally reviewed and interpreted by me today and showed an AHI of 4.7/hr on auto CPAP from 5 to 10 cm H2O with 90% compliance in using more than 4 hours nightly.  The patient has been using and benefiting from PAP use and will continue to benefit from therapy.  -I looks like his mask is leaking some but he has not changed out his cushion in over 6-8 weeks -I encouraged him to change out the cushion every 4 weeks to avoid leaking of the mask   5.  HLD: -LDL goal < 100 -I have personally reviewed and interpreted outside labs performed by patient's PCP which showed LDL 112 and HDL 52 on 09/15/2022 -he stopped the Crestor due to pain and was supposed to go to lipid clinic but was busy getting ready for neck  surgery and never went -I will refer back to lipid clinic  6.  Dilated aortic root/ascending aorta -aortic root measured 38mm on echo 2018 and 40 mm in 2024 -Ascending aorta 41 mm by echo 2024 -repeat echo 09/2023 to reassess   Current medicines are reviewed at length with the patient today.  The patient does not have concerns regarding medicines.   Labs/ tests ordered today include: Lipid panel, CMET and 2D echo No orders of the defined types were placed in this encounter.     Disposition:   FU with me in 1 year  Signed, Armanda Magic, MD  02/22/2023 10:01 AM    [facility] [address] [place], [place], [place]  [address] Phone: [phone]; Fax: [phone]

## 2023-02-24 ENCOUNTER — Other Ambulatory Visit: Payer: Self-pay | Admitting: Family Medicine

## 2023-02-24 ENCOUNTER — Ambulatory Visit (HOSPITAL_BASED_OUTPATIENT_CLINIC_OR_DEPARTMENT_OTHER): Payer: Medicare HMO | Admitting: Physical Therapy

## 2023-02-24 ENCOUNTER — Encounter (HOSPITAL_BASED_OUTPATIENT_CLINIC_OR_DEPARTMENT_OTHER): Payer: Self-pay | Admitting: Physical Therapy

## 2023-02-24 DIAGNOSIS — R293 Abnormal posture: Secondary | ICD-10-CM

## 2023-02-24 DIAGNOSIS — M25511 Pain in right shoulder: Secondary | ICD-10-CM | POA: Diagnosis not present

## 2023-02-24 DIAGNOSIS — G8929 Other chronic pain: Secondary | ICD-10-CM | POA: Diagnosis not present

## 2023-02-24 DIAGNOSIS — M6281 Muscle weakness (generalized): Secondary | ICD-10-CM

## 2023-02-24 NOTE — Therapy (Unsigned)
OUTPATIENT PHYSICAL THERAPY TREATMENT   Patient Name: Victor Medina MRN: [id number] DOB:03/27/1946, 77 y.o., male Today's Date: 02/25/2023  END OF SESSION:   PT End of Session - 02/24/23        Visit Number 5     Number of Visits 17     Date for PT Re-Evaluation 04/02/23     Authorization Type [employer]     PT Start Time 1415     PT Stop Time 1449     PT Time Calculation (min) 34 min     Activity Tolerance Patient tolerated treatment well     Behavior During Therapy WFL for tasks assessed/performed         Past Medical History:  Diagnosis Date   Acquired dilation of ascending aorta and aortic root (HCC)    41 mm and 40 mm respectively   Arthritis    Asthma    Cancer (HCC)    mild skin   Chronic diastolic CHF (congestive heart failure) (HCC)    GERD (gastroesophageal reflux disease)    Hypertension    Morbid obesity (HCC)    OSA (obstructive sleep apnea)    PAF (paroxysmal atrial fibrillation) (HCC) 08/14/2015   s/p TEE/DCCV to NSR;  denies palplita    RBBB    atrial fib on cardizem   Past Surgical History:  Procedure Laterality Date   ANTERIOR CERVICAL DECOMP/DISCECTOMY FUSION N/A 01/13/2023   Procedure: CERVICAL 4- CERVICAL 5, CERVICAL 5- CERVICAL 6 ANTERIOR CERVICAL DECOMPRESSION FUSION WITH INSTRUMENTATION AND ALLOGRAFT;  Surgeon: Estill Bamberg, MD;  Location: [facility] OR;  Service: Orthopedics;  Laterality: N/A;   CARDIOVERSION N/A 05/19/2015   Procedure: CARDIOVERSION;  Surgeon: Pricilla Riffle, MD;  Location: [facility] ENDOSCOPY;  Service: Cardiovascular;  Laterality: N/A;   CARDIOVERSION N/A 12/17/2021   Procedure: CARDIOVERSION;  Surgeon: Pricilla Riffle, MD;  Location: [facility] ENDOSCOPY;  Service: Cardiovascular;  Laterality: N/A;   CATARACT EXTRACTION W/ INTRAOCULAR LENS IMPLANT Right    CHOLECYSTECTOMY N/A 12/01/2012   Procedure: LAPAROSCOPIC CHOLECYSTECTOMY;  Surgeon: Cherylynn Ridges, MD;  Location: [facility] OR;  Service: General;  Laterality: N/A;   COLONOSCOPY WITH PROPOFOL N/A  07/13/2019   Procedure: COLONOSCOPY WITH PROPOFOL;  Surgeon: Jeani Hawking, MD;  Location: [facility] ENDOSCOPY;  Service: Endoscopy;  Laterality: N/A;   MASS EXCISION Right 02/20/2018   Procedure: RIGHT THUMB EXCISION MASS AND DEBRIDEMENT DISTAL INTRPHLANGEAL JOINT;  Surgeon: Betha Loa, MD;  Location:  SURGERY CENTER;  Service: Orthopedics;  Laterality: Right;   POLYPECTOMY  07/13/2019   Procedure: POLYPECTOMY;  Surgeon: Jeani Hawking, MD;  Location: [facility] ENDOSCOPY;  Service: Endoscopy;;   right knee     meniscus removal 6 years ago   SHOULDER SURGERY Left    Rotator Cuff Repair   TEE WITHOUT CARDIOVERSION N/A 05/19/2015   Procedure: TRANSESOPHAGEAL ECHOCARDIOGRAM (TEE);  Surgeon: Pricilla Riffle, MD;  Location: [facility] ENDOSCOPY;  Service: Cardiovascular;  Laterality: N/A;   Patient Active Problem List   Diagnosis Date Noted   Cough 07/08/2022   Wellness examination 01/19/2022   Persistent atrial fibrillation (HCC) 11/27/2021   Bilateral knee pain 09/29/2021   Hyperlipidemia 09/20/2018   PAF (paroxysmal atrial fibrillation) (HCC) 08/14/2015   Acquired dilation of ascending aorta and aortic root (HCC) 10/26/2013   Edema of extremities 10/26/2013   HTN (hypertension) 10/26/2013   Chronic diastolic CHF (congestive heart failure) (HCC)    RBBB    Obstructive sleep apnea 08/16/2013   Morbid obesity (HCC) 08/16/2013   Rotator cuff disorder  08/16/2013   Cholecystitis, acute s/p laparoscopic cholecystectomy 12/01/12 12/02/2012     REFERRING PROVIDER:  Huel Cote, MD    REFERRING DIAG:  M75.101 (ICD-10-CM) - Nontraumatic tear of right rotator cuff, unspecified tear extent    Rationale for Evaluation and Treatment: Rehabilitation  THERAPY DIAG:  Chronic right shoulder pain  Muscle weakness (generalized)  Abnormal posture  ONSET DATE: about 2 years ago   SUBJECTIVE:                                                                                                                                                                                            SUBJECTIVE STATEMENT: Pt reports his MD discharged his cervical collar. Pt reports he will be using bone stimulator for another year.  Pt states MD released him to gym exercises.  Pt states he performed some of the gym exercises this week.  Pt reports he had no cervical pain, but could feel some tension later.  He denies any increased pain in shoulder, just some soreness.  Pt states he is almost able to reach shower head with R UE.  Pt states he has improved ease with wall walks to put deodorant on though still has difficulty.  He denies any adverse effects after prior Rx.   PERTINENT HISTORY:  C4-6 ACDF 01/13/23 A-fib L rotator cuff tear and surgery at least 10 years ago  PAIN: 0/10 R shoulder and cervical pain   PRECAUTIONS:  ACDF 01/13/23  WEIGHT BEARING RESTRICTIONS:  Yes NWB and 10 lb due to cervical surgery  FALLS:  Has patient fallen in last 6 months? Yes. Number of falls 1  PLOF:  Independent  PATIENT GOALS:  Back to gym, back to skeet shooting   OBJECTIVE:   DIAGNOSTIC FINDINGS:  Rt GHJ MRI 11/29/22: IMPRESSION: 1. Moderate tendinosis of the supraspinatus tendon with a tiny insertional interstitial tear. 2. Moderate tendinosis of the infraspinatus tendon. 3. Moderate tendinosis of the intra-articular portion of the long head of the biceps tendon. 4. Partial-thickness cartilage loss of the glenohumeral joint. CERVICAL Spine: IMPRESSION: 1. C5-C6 moderate spinal canal stenosis with moderate to severe right and mild-to-moderate left neural foraminal narrowing. 2. C4-C5 mild spinal canal stenosis with moderate right and mild left neural foraminal narrowing. 3. C6-C7 mild spinal canal stenosis with mild-to- moderate left and mild right neural foraminal narrowing.   HAND DOMINANCE:  Right    TREATMENT:                                                                                                                                -  Pt received PROM R shoulder in flexion, scaption, and ER  Supine wand flexion x 10 reps Supine active flexion x10 Supine ER AROM x 10 Sidelying ER approx 6 reps Pulleys flexion and scaption 2' ea Finger ladder flexion x7 Standing rows with RTB x 20 reps     PATIENT EDUCATION:  Education details:  PT instructed pt to not perform crunch machine in the gym and to inform MD of what exercises he was performing.  Anatomy of condition, POC, HEP, exercise form/rationale.  Person educated: Patient Education method: Explanation, Demonstration, Tactile cues, Verbal cues, and Handouts Education comprehension: verbalized understanding, returned demonstration, verbal cues required, tactile cues required, and needs further education  HOME EXERCISE PROGRAM: [id number] (emailed)   ASSESSMENT:  CLINICAL IMPRESSION: Pt arrived late to treatment today.  He continues to be limited with UE reaching and mobility though reports some improvement.  Pt is able to perform supine flexion AROM.  Pt has difficulty with S/L ER having pain and requiring cuing/instruction for correct form.  PT had pt stop S/L ER due to pain; he was able to perform supine ER AROM well.  Pt's R UE fatigued with standing ladder walks.  Pt requires instruction in correct form and hand positioning with pulleys.  He responded well to Rx reporting no increased pain after Rx.     OBJECTIVE IMPAIRMENTS: decreased activity tolerance, decreased ROM, decreased strength, increased muscle spasms, impaired sensation, impaired UE functional use, improper body mechanics, postural dysfunction, and pain.   ACTIVITY LIMITATIONS: carrying, lifting, bed mobility, bathing, dressing, reach over head, and hygiene/grooming  PARTICIPATION LIMITATIONS: meal prep, cleaning, driving, and community activity  PERSONAL FACTORS:  see PMH  are also affecting patient's functional outcome.   REHAB POTENTIAL: Good  CLINICAL DECISION  MAKING: Evolving/moderate complexity  EVALUATION COMPLEXITY: Moderate   GOALS: Goals reviewed with patient? Yes  SHORT TERM GOALS: Target date: 7/20  Pull a towel behind back with minimal to no difficulty Baseline: Goal status: INITIAL  2.  Increase AROM flexion against gravity by at least 10 deg Baseline:  Goal status: INITIAL    LONG TERM GOALS: Target date: POC date  Lift 10lb to return to [employer] shooting (fall) Baseline:  Goal status: INITIAL  2.  AROM to overhead to brush hair Baseline:  Goal status: INITIAL  3.  Meet FOTO goal Baseline:  Goal status: INITIAL  4.  Straight plane MMT to at least 75% of opp UE Baseline:  Goal status: INITIAL  5.  Return to weight training in gym with proper form Baseline:  Goal status: INITIAL    PLAN:  PT FREQUENCY: 1-2x/week  PT DURATION: 8 weeks  PLANNED INTERVENTIONS: Therapeutic exercises, Therapeutic activity, Neuromuscular re-education, Patient/Family education, Self Care, Joint mobilization, Aquatic Therapy, Dry Needling, Electrical stimulation, Spinal mobilization, Cryotherapy, Moist heat, scar mobilization, Taping, Ultrasound, Ionotophoresis 4mg /ml Dexamethasone, Manual therapy, and Re-evaluation.  PLAN FOR NEXT SESSION:  Cont with shoulder AROM/AAROM/PROM and scapular exercises as appropriate per healing constraints of cervical fusion protocol.   Audie Clear III PT, DPT 02/26/23 12:02 AM

## 2023-02-25 NOTE — Therapy (Incomplete)
OUTPATIENT PHYSICAL THERAPY TREATMENT   Patient Name: Victor Medina MRN: [id number] DOB:1945/09/15, 77 y.o., male Today's Date: 02/25/2023  END OF SESSION:   PT End of Session - 02/24/23        Visit Number 5     Number of Visits 17     Date for PT Re-Evaluation 04/02/23     Authorization Type [employer]     PT Start Time 1415     PT Stop Time 1449     PT Time Calculation (min) 34 min     Activity Tolerance Patient tolerated treatment well     Behavior During Therapy WFL for tasks assessed/performed         Past Medical History:  Diagnosis Date  . Acquired dilation of ascending aorta and aortic root (HCC)    41 mm and 40 mm respectively  . Arthritis   . Asthma   . Cancer (HCC)    mild skin  . Chronic diastolic CHF (congestive heart failure) (HCC)   . GERD (gastroesophageal reflux disease)   . Hypertension   . Morbid obesity (HCC)   . OSA (obstructive sleep apnea)   . PAF (paroxysmal atrial fibrillation) (HCC) 08/14/2015   s/p TEE/DCCV to NSR;  denies palplita   . RBBB    atrial fib on cardizem   Past Surgical History:  Procedure Laterality Date  . ANTERIOR CERVICAL DECOMP/DISCECTOMY FUSION N/A 01/13/2023   Procedure: CERVICAL 4- CERVICAL 5, CERVICAL 5- CERVICAL 6 ANTERIOR CERVICAL DECOMPRESSION FUSION WITH INSTRUMENTATION AND ALLOGRAFT;  Surgeon: Estill Bamberg, MD;  Location: [facility] OR;  Service: Orthopedics;  Laterality: N/A;  . CARDIOVERSION N/A 05/19/2015   Procedure: CARDIOVERSION;  Surgeon: Pricilla Riffle, MD;  Location: [facility] ENDOSCOPY;  Service: Cardiovascular;  Laterality: N/A;  . CARDIOVERSION N/A 12/17/2021   Procedure: CARDIOVERSION;  Surgeon: Pricilla Riffle, MD;  Location: [facility] ENDOSCOPY;  Service: Cardiovascular;  Laterality: N/A;  . CATARACT EXTRACTION W/ INTRAOCULAR LENS IMPLANT Right   . CHOLECYSTECTOMY N/A 12/01/2012   Procedure: LAPAROSCOPIC CHOLECYSTECTOMY;  Surgeon: Cherylynn Ridges, MD;  Location: [facility] OR;  Service: General;  Laterality: N/A;  . COLONOSCOPY  WITH PROPOFOL N/A 07/13/2019   Procedure: COLONOSCOPY WITH PROPOFOL;  Surgeon: Jeani Hawking, MD;  Location: [facility] ENDOSCOPY;  Service: Endoscopy;  Laterality: N/A;  . MASS EXCISION Right 02/20/2018   Procedure: RIGHT THUMB EXCISION MASS AND DEBRIDEMENT DISTAL INTRPHLANGEAL JOINT;  Surgeon: Betha Loa, MD;  Location: [facility];  Service: Orthopedics;  Laterality: Right;  . POLYPECTOMY  07/13/2019   Procedure: POLYPECTOMY;  Surgeon: Jeani Hawking, MD;  Location: [facility] ENDOSCOPY;  Service: Endoscopy;;  . right knee     meniscus removal 6 years ago  . SHOULDER SURGERY Left    Rotator Cuff Repair  . TEE WITHOUT CARDIOVERSION N/A 05/19/2015   Procedure: TRANSESOPHAGEAL ECHOCARDIOGRAM (TEE);  Surgeon: Pricilla Riffle, MD;  Location: [facility] ENDOSCOPY;  Service: Cardiovascular;  Laterality: N/A;   Patient Active Problem List   Diagnosis Date Noted  . Cough 07/08/2022  . Wellness examination 01/19/2022  . Persistent atrial fibrillation (HCC) 11/27/2021  . Bilateral knee pain 09/29/2021  . Hyperlipidemia 09/20/2018  . PAF (paroxysmal atrial fibrillation) (HCC) 08/14/2015  . Acquired dilation of ascending aorta and aortic root (HCC) 10/26/2013  . Edema of extremities 10/26/2013  . HTN (hypertension) 10/26/2013  . Chronic diastolic CHF (congestive heart failure) (HCC)   . RBBB   . Obstructive sleep apnea 08/16/2013  . Morbid obesity (HCC) 08/16/2013  . Rotator cuff disorder  08/16/2013  . Cholecystitis, acute s/p laparoscopic cholecystectomy 12/01/12 12/02/2012     REFERRING PROVIDER:  Huel Cote, MD    REFERRING DIAG:  M75.101 (ICD-10-CM) - Nontraumatic tear of right rotator cuff, unspecified tear extent    Rationale for Evaluation and Treatment: Rehabilitation  THERAPY DIAG:  Chronic right shoulder pain  Muscle weakness (generalized)  Abnormal posture  ONSET DATE: about 2 years ago   SUBJECTIVE:                                                                                                                                                                                            SUBJECTIVE STATEMENT: Pt reports his MD discharged his cervical collar. Pt reports he will be using bone stimulator for another year.  Pt states MD released him to gym exercises.  Pt states he performed some of the gym exercises this week.  Pt reports he had no cervical pain, but could feel some tension later.  He denies any increased pain in shoulder, just some soreness.  Pt states he is almost able to reach shower head with R UE.  Pt states he has improved ease with wall walks to put deodorant on though still has difficulty.  He denies any adverse effects after prior Rx.   PERTINENT HISTORY:  C4-6 ACDF 01/13/23 A-fib L rotator cuff tear and surgery at least 10 years ago  PAIN: 0/10 R shoulder and cervical pain   PRECAUTIONS:  ACDF 01/13/23  WEIGHT BEARING RESTRICTIONS:  Yes NWB and 10 lb due to cervical surgery  FALLS:  Has patient fallen in last 6 months? Yes. Number of falls 1  PLOF:  Independent  PATIENT GOALS:  Back to gym, back to skeet shooting   OBJECTIVE:   DIAGNOSTIC FINDINGS:  Rt GHJ MRI 11/29/22: IMPRESSION: 1. Moderate tendinosis of the supraspinatus tendon with a tiny insertional interstitial tear. 2. Moderate tendinosis of the infraspinatus tendon. 3. Moderate tendinosis of the intra-articular portion of the long head of the biceps tendon. 4. Partial-thickness cartilage loss of the glenohumeral joint. CERVICAL Spine: IMPRESSION: 1. C5-C6 moderate spinal canal stenosis with moderate to severe right and mild-to-moderate left neural foraminal narrowing. 2. C4-C5 mild spinal canal stenosis with moderate right and mild left neural foraminal narrowing. 3. C6-C7 mild spinal canal stenosis with mild-to- moderate left and mild right neural foraminal narrowing.   HAND DOMINANCE:  Right    TREATMENT:                                                                                                                                -  Pt received PROM R shoulder in flexion, scaption, and ER  Supine wand flexion x 10 reps Supine active flexion x10 Supine ER AROM x 10 Sidelying ER approx 6 reps Pulleys flexion and scaption 2' ea Finger ladder flexion x7 Standing rows with RTB x 20 reps     PATIENT EDUCATION:  Education details:  PT instructed pt to not perform crunch machine in the gym and to inform MD of what exercises he was performing.  Anatomy of condition, POC, HEP, exercise form/rationale.  Person educated: Patient Education method: Explanation, Demonstration, Tactile cues, Verbal cues, and Handouts Education comprehension: verbalized understanding, returned demonstration, verbal cues required, tactile cues required, and needs further education  HOME EXERCISE PROGRAM: [id number] (emailed)   ASSESSMENT:  CLINICAL IMPRESSION: Pt arrived late to treatment today.  He continues to be limited with UE reaching and mobility though reports some improvement.  Pt is able to perform supine flexion AROM.  Pt has difficulty with S/L ER having pain and requiring cuing/instruction for correct form.  PT had pt stop S/L ER due to pain; he was able to perform supine ER AROM well.  Pt's R UE fatigued with standing ladder walks.  Pt requires instruction in correct form and hand positioning with pulleys.  He responded well to Rx reporting no increased pain after Rx.     OBJECTIVE IMPAIRMENTS: decreased activity tolerance, decreased ROM, decreased strength, increased muscle spasms, impaired sensation, impaired UE functional use, improper body mechanics, postural dysfunction, and pain.   ACTIVITY LIMITATIONS: carrying, lifting, bed mobility, bathing, dressing, reach over head, and hygiene/grooming  PARTICIPATION LIMITATIONS: meal prep, cleaning, driving, and community activity  PERSONAL FACTORS:  see PMH  are also affecting patient's functional outcome.    REHAB POTENTIAL: Good  CLINICAL DECISION MAKING: Evolving/moderate complexity  EVALUATION COMPLEXITY: Moderate   GOALS: Goals reviewed with patient? Yes  SHORT TERM GOALS: Target date: 7/20  Pull a towel behind back with minimal to no difficulty Baseline: Goal status: INITIAL  2.  Increase AROM flexion against gravity by at least 10 deg Baseline:  Goal status: INITIAL    LONG TERM GOALS: Target date: POC date  Lift 10lb to return to [facility] shooting (fall) Baseline:  Goal status: INITIAL  2.  AROM to overhead to brush hair Baseline:  Goal status: INITIAL  3.  Meet FOTO goal Baseline:  Goal status: INITIAL  4.  Straight plane MMT to at least 75% of opp UE Baseline:  Goal status: INITIAL  5.  Return to weight training in gym with proper form Baseline:  Goal status: INITIAL    PLAN:  PT FREQUENCY: 1-2x/week  PT DURATION: 8 weeks  PLANNED INTERVENTIONS: Therapeutic exercises, Therapeutic activity, Neuromuscular re-education, Patient/Family education, Self Care, Joint mobilization, Aquatic Therapy, Dry Needling, Electrical stimulation, Spinal mobilization, Cryotherapy, Moist heat, scar mobilization, Taping, Ultrasound, Ionotophoresis 4mg /ml Dexamethasone, Manual therapy, and Re-evaluation.  PLAN FOR NEXT SESSION:  Cont with AROM/AAROM/PROM   Audie Clear III PT, DPT 02/26/23 12:02 AM

## 2023-02-28 ENCOUNTER — Encounter (HOSPITAL_COMMUNITY): Payer: Self-pay

## 2023-02-28 ENCOUNTER — Encounter (HOSPITAL_BASED_OUTPATIENT_CLINIC_OR_DEPARTMENT_OTHER): Payer: Self-pay

## 2023-02-28 ENCOUNTER — Ambulatory Visit (HOSPITAL_BASED_OUTPATIENT_CLINIC_OR_DEPARTMENT_OTHER): Payer: Medicare HMO

## 2023-02-28 DIAGNOSIS — R293 Abnormal posture: Secondary | ICD-10-CM | POA: Diagnosis not present

## 2023-02-28 DIAGNOSIS — G8929 Other chronic pain: Secondary | ICD-10-CM

## 2023-02-28 DIAGNOSIS — M6281 Muscle weakness (generalized): Secondary | ICD-10-CM | POA: Diagnosis not present

## 2023-02-28 DIAGNOSIS — M25511 Pain in right shoulder: Secondary | ICD-10-CM | POA: Diagnosis not present

## 2023-02-28 NOTE — Therapy (Signed)
OUTPATIENT PHYSICAL THERAPY TREATMENT   Patient Name: Victor Medina MRN: [id number] DOB:[birth year], 77 y.o., male Today's Date: 02/28/2023  END OF SESSION:  PT End of Session - 02/28/23 1514     Visit Number 6    Number of Visits 17    Date for PT Re-Evaluation 04/02/23    Authorization Type Aetna    PT Start Time 1518    PT Stop Time 1554    PT Time Calculation (min) 36 min    Activity Tolerance Patient tolerated treatment well    Behavior During Therapy [facility] for tasks assessed/performed             PT End of Session - 02/24/23        Visit Number 5     Number of Visits 17     Date for PT Re-Evaluation 04/02/23     Authorization Type Aetna     PT Start Time 1415     PT Stop Time 1449     PT Time Calculation (min) 34 min     Activity Tolerance Patient tolerated treatment well     Behavior During Therapy WFL for tasks assessed/performed         Past Medical History:  Diagnosis Date   Acquired dilation of ascending aorta and aortic root (HCC)    41 mm and 40 mm respectively   Arthritis    Asthma    Cancer (HCC)    mild skin   Chronic diastolic CHF (congestive heart failure) (HCC)    GERD (gastroesophageal reflux disease)    Hypertension    Morbid obesity (HCC)    OSA (obstructive sleep apnea)    PAF (paroxysmal atrial fibrillation) (HCC) 08/14/2015   s/p TEE/DCCV to NSR;  denies palplita    RBBB    atrial fib on cardizem   Past Surgical History:  Procedure Laterality Date   ANTERIOR CERVICAL DECOMP/DISCECTOMY FUSION N/A 01/13/2023   Procedure: CERVICAL 4- CERVICAL 5, CERVICAL 5- CERVICAL 6 ANTERIOR CERVICAL DECOMPRESSION FUSION WITH INSTRUMENTATION AND ALLOGRAFT;  Surgeon: Estill Bamberg, MD;  Location: [facility] OR;  Service: Orthopedics;  Laterality: N/A;   CARDIOVERSION N/A 05/19/2015   Procedure: CARDIOVERSION;  Surgeon: Pricilla Riffle, MD;  Location: [facility] ENDOSCOPY;  Service: Cardiovascular;  Laterality: N/A;   CARDIOVERSION N/A 12/17/2021   Procedure:  CARDIOVERSION;  Surgeon: Pricilla Riffle, MD;  Location: [facility] ENDOSCOPY;  Service: Cardiovascular;  Laterality: N/A;   CATARACT EXTRACTION W/ INTRAOCULAR LENS IMPLANT Right    CHOLECYSTECTOMY N/A 12/01/2012   Procedure: LAPAROSCOPIC CHOLECYSTECTOMY;  Surgeon: Cherylynn Ridges, MD;  Location: [facility] OR;  Service: General;  Laterality: N/A;   COLONOSCOPY WITH PROPOFOL N/A 07/13/2019   Procedure: COLONOSCOPY WITH PROPOFOL;  Surgeon: Jeani Hawking, MD;  Location: [facility] ENDOSCOPY;  Service: Endoscopy;  Laterality: N/A;   MASS EXCISION Right 02/20/2018   Procedure: RIGHT THUMB EXCISION MASS AND DEBRIDEMENT DISTAL INTRPHLANGEAL JOINT;  Surgeon: Betha Loa, MD;  Location: [facility];  Service: Orthopedics;  Laterality: Right;   POLYPECTOMY  07/13/2019   Procedure: POLYPECTOMY;  Surgeon: Jeani Hawking, MD;  Location: [facility] ENDOSCOPY;  Service: Endoscopy;;   right knee     meniscus removal 6 years ago   SHOULDER SURGERY Left    Rotator Cuff Repair   TEE WITHOUT CARDIOVERSION N/A 05/19/2015   Procedure: TRANSESOPHAGEAL ECHOCARDIOGRAM (TEE);  Surgeon: Pricilla Riffle, MD;  Location: [facility] ENDOSCOPY;  Service: Cardiovascular;  Laterality: N/A;   Patient Active Problem List   Diagnosis Date Noted  Cough 07/08/2022   Wellness examination 01/19/2022   Persistent atrial fibrillation (HCC) 11/27/2021   Bilateral knee pain 09/29/2021   Hyperlipidemia 09/20/2018   PAF (paroxysmal atrial fibrillation) (HCC) 08/14/2015   Acquired dilation of ascending aorta and aortic root (HCC) 10/26/2013   Edema of extremities 10/26/2013   HTN (hypertension) 10/26/2013   Chronic diastolic CHF (congestive heart failure) (HCC)    RBBB    Obstructive sleep apnea 08/16/2013   Morbid obesity (HCC) 08/16/2013   Rotator cuff disorder 08/16/2013   Cholecystitis, acute s/p laparoscopic cholecystectomy 12/01/12 12/02/2012     REFERRING PROVIDER:  Huel Cote, MD    REFERRING DIAG:  M75.101 (ICD-10-CM) - Nontraumatic tear  of right rotator cuff, unspecified tear extent    Rationale for Evaluation and Treatment: Rehabilitation  THERAPY DIAG:  Chronic right shoulder pain  Muscle weakness (generalized)  Abnormal posture  ONSET DATE: about 2 years ago   SUBJECTIVE:                                                                                                                                                                                           SUBJECTIVE STATEMENT: Pt has minor pain in shoulder and discomfort after exercises. Reports no significant pain.    PERTINENT HISTORY:  C4-6 ACDF 01/13/23 A-fib L rotator cuff tear and surgery at least 10 years ago  PAIN: 0/10 R shoulder and cervical pain   PRECAUTIONS:  ACDF 01/13/23  WEIGHT BEARING RESTRICTIONS:  Yes NWB and 10 lb due to cervical surgery  FALLS:  Has patient fallen in last 6 months? Yes. Number of falls 1  PLOF:  Independent  PATIENT GOALS:  Back to gym, back to skeet shooting   OBJECTIVE:   DIAGNOSTIC FINDINGS:  Rt GHJ MRI 11/29/22: IMPRESSION: 1. Moderate tendinosis of the supraspinatus tendon with a tiny insertional interstitial tear. 2. Moderate tendinosis of the infraspinatus tendon. 3. Moderate tendinosis of the intra-articular portion of the long head of the biceps tendon. 4. Partial-thickness cartilage loss of the glenohumeral joint. CERVICAL Spine: IMPRESSION: 1. C5-C6 moderate spinal canal stenosis with moderate to severe right and mild-to-moderate left neural foraminal narrowing. 2. C4-C5 mild spinal canal stenosis with moderate right and mild left neural foraminal narrowing. 3. C6-C7 mild spinal canal stenosis with mild-to- moderate left and mild right neural foraminal narrowing.    FOTO: 7/22: 61% HAND DOMINANCE:  Right    TREATMENT:                                                                                                                               -  Pt received PROM R shoulder in flexion,  scaption, and ER  Supine wand flexion 2x 10 reps Supine active flexion 2x10 Sidelying ER 2x10 Sidelying abduction x10 (challenging) Pulleys flexion and scaption 2' ea Ball roll up wall (pink) x5 (difficult) Standing rows with GTB x 20 reps Standing shoulder ext with GTB x20     PATIENT EDUCATION:  Education details:  PT instructed pt to not perform crunch machine in the gym and to inform MD of what exercises he was performing.  Anatomy of condition, POC, HEP, exercise form/rationale.  Person educated: Patient Education method: Explanation, Demonstration, Tactile cues, Verbal cues, and Handouts Education comprehension: verbalized understanding, returned demonstration, verbal cues required, tactile cues required, and needs further education  HOME EXERCISE PROGRAM: [id number] (emailed)   ASSESSMENT:  CLINICAL IMPRESSION: FOTO score increased to 61%. Pt educated about pain limitations and avoiding pushing past these. He is improving with AROM and AAROM activities, able to complete increased repetitions. No pain with sidelying ER today. Trialed ball rolls overhead up wall, however, this was challenging and did result in discomfort. Improving performance with pulleys as pt is now able to assist more with R UE. Will continue to work on improving functional ROM and strength.    OBJECTIVE IMPAIRMENTS: decreased activity tolerance, decreased ROM, decreased strength, increased muscle spasms, impaired sensation, impaired UE functional use, improper body mechanics, postural dysfunction, and pain.   ACTIVITY LIMITATIONS: carrying, lifting, bed mobility, bathing, dressing, reach over head, and hygiene/grooming  PARTICIPATION LIMITATIONS: meal prep, cleaning, driving, and community activity  PERSONAL FACTORS:  see PMH  are also affecting patient's functional outcome.   REHAB POTENTIAL: Good  CLINICAL DECISION MAKING: Evolving/moderate complexity  EVALUATION COMPLEXITY:  Moderate   GOALS: Goals reviewed with patient? Yes  SHORT TERM GOALS: Target date: 7/20  Pull a towel behind back with minimal to no difficulty Baseline: Goal status: INITIAL  2.  Increase AROM flexion against gravity by at least 10 deg Baseline:  Goal status: INITIAL    LONG TERM GOALS: Target date: POC date  Lift 10lb to return to [facility] shooting (fall) Baseline:  Goal status: INITIAL  2.  AROM to overhead to brush hair Baseline:  Goal status: INITIAL  3.  Meet FOTO goal Baseline:  Goal status: IN PROGRESS 7/22  4.  Straight plane MMT to at least 75% of opp UE Baseline:  Goal status: INITIAL  5.  Return to weight training in gym with proper form Baseline:  Goal status: INITIAL    PLAN:  PT FREQUENCY: 1-2x/week  PT DURATION: 8 weeks  PLANNED INTERVENTIONS: Therapeutic exercises, Therapeutic activity, Neuromuscular re-education, Patient/Family education, Self Care, Joint mobilization, Aquatic Therapy, Dry Needling, Electrical stimulation, Spinal mobilization, Cryotherapy, Moist heat, scar mobilization, Taping, Ultrasound, Ionotophoresis 4mg /ml Dexamethasone, Manual therapy, and Re-evaluation.  PLAN FOR NEXT SESSION:  Cont with shoulder AROM/AAROM/PROM and scapular exercises as appropriate per healing constraints of cervical fusion protocol.  Riki Altes, PTA  02/28/23 4:00 PM

## 2023-03-01 ENCOUNTER — Ambulatory Visit: Payer: Medicare HMO

## 2023-03-01 NOTE — Progress Notes (Deleted)
Patient ID: Victor Medina                 DOB: 1946-01-25                    MRN: [id number]      HPI: Victor Medina is a 77 y.o. male patient referred to lipid clinic by Dr. Mayford Knife. PMH is significant for PAF s/p cardioversion in 2016, mildly dilated aortic root, chronic diastolic CHF, chonic LE edema, RBBB, OSA on CPAP.   Labs on or off medication? New labs?   Reviewed options for lowering LDL cholesterol, including ezetimibe, PCSK-9 inhibitors, bempedoic acid and inclisiran.  Discussed mechanisms of action, dosing, side effects and potential decreases in LDL cholesterol.  Also reviewed cost information and potential options for patient assistance.   Current Medications: none Intolerances: atorvastatin 20,40mg  , rosuvastatin 5mg  daily, simvastatin 20, 40mg   Risk Factors: age LDL-C goal: <100 ApoB goal:   Diet:   Exercise:   Family History:   Social History:  The patient  reports that he has quit smoking. His smoking use included cigarettes. He has never used smokeless tobacco. He reports that he does not currently use alcohol. He reports that he does not use drugs.   Labs: Lipid Panel     Component Value Date/Time   CHOL 180 09/15/2022 1007   TRIG 85 09/15/2022 1007   HDL 52 09/15/2022 1007   CHOLHDL 3.5 09/15/2022 1007   LDLCALC 112 (H) 09/15/2022 1007   LABVLDL 16 09/15/2022 1007    Past Medical History:  Diagnosis Date   Acquired dilation of ascending aorta and aortic root (HCC)    41 mm and 40 mm respectively   Arthritis    Asthma    Cancer (HCC)    mild skin   Chronic diastolic CHF (congestive heart failure) (HCC)    GERD (gastroesophageal reflux disease)    Hypertension    Morbid obesity (HCC)    OSA (obstructive sleep apnea)    PAF (paroxysmal atrial fibrillation) (HCC) 08/14/2015   s/p TEE/DCCV to NSR;  denies palplita    RBBB    atrial fib on cardizem    Current Outpatient Medications on File Prior to Visit  Medication Sig Dispense Refill    acetaminophen (TYLENOL) 500 MG tablet Take 1,000 mg by mouth every 6 (six) hours as needed for moderate pain.     apixaban (ELIQUIS) 5 MG TABS tablet Take 5 mg by mouth 2 (two) times daily.     APPLE CIDER VINEGAR PO Take 2 capsules by mouth daily.     Cholecalciferol (VITAMIN D3) 2000 UNITS TABS Take 4,000 Units by mouth daily.      diltiazem (CARDIZEM CD) 120 MG 24 hr capsule Take 1 capsule (120 mg total) by mouth 2 (two) times daily. 90 capsule 3   dronedarone (MULTAQ) 400 MG tablet Take 1 tablet (400 mg total) by mouth 2 (two) times daily with a meal. 30 tablet 0   fluticasone (FLONASE) 50 MCG/ACT nasal spray Place 1 spray into both nostrils at bedtime.     furosemide (LASIX) 20 MG tablet Take 1 tablet (20 mg total) by mouth daily. Take 2 tabs daily for 3 days, then reduce dose to one tablet daily. (Patient not taking: Reported on 02/22/2023) 90 tablet 3   hydrochlorothiazide (HYDRODIURIL) 25 MG tablet Take 1 tablet (25 mg total) by mouth daily. 90 tablet 3   HYDROcodone-acetaminophen (NORCO/VICODIN) 5-325 MG tablet Take 1 tablet by mouth  every 6 (six) hours as needed for moderate pain or severe pain. 20 tablet 0   loratadine (CLARITIN) 10 MG tablet Take 10 mg by mouth daily.     Magnesium 250 MG TABS Take 250 mg by mouth 2 (two) times daily.     methocarbamol (ROBAXIN) 750 MG tablet Take 1 tablet (750 mg total) by mouth every 6 (six) hours as needed for muscle spasms. 60 tablet 0   metoprolol tartrate (LOPRESSOR) 25 MG tablet TAKE 1 AND 1/2 TABLETS BY MOUTH TWICE DAILY 270 tablet 1   Multiple Vitamins-Minerals (MULTIVITAMIN ADULT) CHEW Chew 2 each by mouth daily.     tamsulosin (FLOMAX) 0.4 MG CAPS capsule TAKE 1 CAPSULE BY MOUTH EVERYDAY AT BEDTIME 90 capsule 0   trolamine salicylate (ASPERCREME) 10 % cream Apply 1 application topically as needed for muscle pain.     WIXELA INHUB 100-50 MCG/ACT AEPB INHALE 1 PUFF INTO THE LUNGS TWICE A DAY 60 each 3   No current facility-administered  medications on file prior to visit.    Allergies  Allergen Reactions   Lipitor [Atorvastatin] Other (See Comments)    Painful joints     Assessment/Plan:  1. Hyperlipidemia -  No problem-specific Assessment & Plan notes found for this encounter.    Thank you,  Olene Floss, Pharm.D, BCPS, CPP [facility] [address], [place], [place] [address]  Phone: [phone]; Fax: [phone]

## 2023-03-03 ENCOUNTER — Ambulatory Visit (HOSPITAL_BASED_OUTPATIENT_CLINIC_OR_DEPARTMENT_OTHER): Payer: Medicare HMO | Admitting: Physical Therapy

## 2023-03-03 ENCOUNTER — Encounter (HOSPITAL_BASED_OUTPATIENT_CLINIC_OR_DEPARTMENT_OTHER): Payer: Self-pay

## 2023-03-07 ENCOUNTER — Ambulatory Visit (HOSPITAL_BASED_OUTPATIENT_CLINIC_OR_DEPARTMENT_OTHER): Payer: Medicare HMO

## 2023-03-07 ENCOUNTER — Encounter (HOSPITAL_BASED_OUTPATIENT_CLINIC_OR_DEPARTMENT_OTHER): Payer: Self-pay

## 2023-03-07 DIAGNOSIS — M25511 Pain in right shoulder: Secondary | ICD-10-CM | POA: Diagnosis not present

## 2023-03-07 DIAGNOSIS — M6281 Muscle weakness (generalized): Secondary | ICD-10-CM

## 2023-03-07 DIAGNOSIS — G8929 Other chronic pain: Secondary | ICD-10-CM | POA: Diagnosis not present

## 2023-03-07 DIAGNOSIS — R293 Abnormal posture: Secondary | ICD-10-CM

## 2023-03-07 NOTE — Therapy (Signed)
OUTPATIENT PHYSICAL THERAPY TREATMENT   Patient Name: Victor Medina MRN: [id number] DOB:1945-08-27, 77 y.o., male Today's Date: 03/07/2023  END OF SESSION:  PT End of Session - 03/07/23 1513     Visit Number 7    Number of Visits 17    Date for PT Re-Evaluation 04/02/23    Authorization Type Aetna    PT Start Time 1514    PT Stop Time 1547    PT Time Calculation (min) 33 min    Activity Tolerance Patient tolerated treatment well    Behavior During Therapy [facility] for tasks assessed/performed              PT End of Session - 02/24/23        Visit Number 5     Number of Visits 17     Date for PT Re-Evaluation 04/02/23     Authorization Type Aetna     PT Start Time 1415     PT Stop Time 1449     PT Time Calculation (min) 34 min     Activity Tolerance Patient tolerated treatment well     Behavior During Therapy WFL for tasks assessed/performed         Past Medical History:  Diagnosis Date   Acquired dilation of ascending aorta and aortic root (HCC)    41 mm and 40 mm respectively   Arthritis    Asthma    Cancer (HCC)    mild skin   Chronic diastolic CHF (congestive heart failure) (HCC)    GERD (gastroesophageal reflux disease)    Hypertension    Morbid obesity (HCC)    OSA (obstructive sleep apnea)    PAF (paroxysmal atrial fibrillation) (HCC) 08/14/2015   s/p TEE/DCCV to NSR;  denies palplita    RBBB    atrial fib on cardizem   Past Surgical History:  Procedure Laterality Date   ANTERIOR CERVICAL DECOMP/DISCECTOMY FUSION N/A 01/13/2023   Procedure: CERVICAL 4- CERVICAL 5, CERVICAL 5- CERVICAL 6 ANTERIOR CERVICAL DECOMPRESSION FUSION WITH INSTRUMENTATION AND ALLOGRAFT;  Surgeon: Estill Bamberg, MD;  Location: [facility] OR;  Service: Orthopedics;  Laterality: N/A;   CARDIOVERSION N/A 05/19/2015   Procedure: CARDIOVERSION;  Surgeon: Pricilla Riffle, MD;  Location: [facility] ENDOSCOPY;  Service: Cardiovascular;  Laterality: N/A;   CARDIOVERSION N/A 12/17/2021   Procedure:  CARDIOVERSION;  Surgeon: Pricilla Riffle, MD;  Location: [facility] ENDOSCOPY;  Service: Cardiovascular;  Laterality: N/A;   CATARACT EXTRACTION W/ INTRAOCULAR LENS IMPLANT Right    CHOLECYSTECTOMY N/A 12/01/2012   Procedure: LAPAROSCOPIC CHOLECYSTECTOMY;  Surgeon: Cherylynn Ridges, MD;  Location: [facility] OR;  Service: General;  Laterality: N/A;   COLONOSCOPY WITH PROPOFOL N/A 07/13/2019   Procedure: COLONOSCOPY WITH PROPOFOL;  Surgeon: Jeani Hawking, MD;  Location: [facility] ENDOSCOPY;  Service: Endoscopy;  Laterality: N/A;   MASS EXCISION Right 02/20/2018   Procedure: RIGHT THUMB EXCISION MASS AND DEBRIDEMENT DISTAL INTRPHLANGEAL JOINT;  Surgeon: Betha Loa, MD;  Location:  SURGERY CENTER;  Service: Orthopedics;  Laterality: Right;   POLYPECTOMY  07/13/2019   Procedure: POLYPECTOMY;  Surgeon: Jeani Hawking, MD;  Location: [facility] ENDOSCOPY;  Service: Endoscopy;;   right knee     meniscus removal 6 years ago   SHOULDER SURGERY Left    Rotator Cuff Repair   TEE WITHOUT CARDIOVERSION N/A 05/19/2015   Procedure: TRANSESOPHAGEAL ECHOCARDIOGRAM (TEE);  Surgeon: Pricilla Riffle, MD;  Location: [facility] ENDOSCOPY;  Service: Cardiovascular;  Laterality: N/A;   Patient Active Problem List   Diagnosis Date  Noted   Cough 07/08/2022   Wellness examination 01/19/2022   Persistent atrial fibrillation (HCC) 11/27/2021   Bilateral knee pain 09/29/2021   Hyperlipidemia 09/20/2018   PAF (paroxysmal atrial fibrillation) (HCC) 08/14/2015   Acquired dilation of ascending aorta and aortic root (HCC) 10/26/2013   Edema of extremities 10/26/2013   HTN (hypertension) 10/26/2013   Chronic diastolic CHF (congestive heart failure) (HCC)    RBBB    Obstructive sleep apnea 08/16/2013   Morbid obesity (HCC) 08/16/2013   Rotator cuff disorder 08/16/2013   Cholecystitis, acute s/p laparoscopic cholecystectomy 12/01/12 12/02/2012     REFERRING PROVIDER:  Huel Cote, MD    REFERRING DIAG:  M75.101 (ICD-10-CM) - Nontraumatic tear  of right rotator cuff, unspecified tear extent    Rationale for Evaluation and Treatment: Rehabilitation  THERAPY DIAG:  Chronic right shoulder pain  Abnormal posture  Muscle weakness (generalized)  ONSET DATE: about 2 years ago   SUBJECTIVE:                                                                                                                                                                                           SUBJECTIVE STATEMENT: Pt reports he fell on Friday at [facility] when getting out of his seat. "I landed right on that arm and shoulder". He denies significant injury and pain, but does have bruising and soreness.    PERTINENT HISTORY:  C4-6 ACDF 01/13/23 A-fib L rotator cuff tear and surgery at least 10 years ago  PAIN: 0/10 R shoulder and cervical pain   PRECAUTIONS:  ACDF 01/13/23  WEIGHT BEARING RESTRICTIONS:  Yes NWB and 10 lb due to cervical surgery  FALLS:  Has patient fallen in last 6 months? Yes. Number of falls 1 Fall on 7/26 per pt. Fell on R shoulder at [employer] PLOF:  Independent  PATIENT GOALS:  Back to gym, back to skeet shooting   OBJECTIVE:   DIAGNOSTIC FINDINGS:  Rt GHJ MRI 11/29/22: IMPRESSION: 1. Moderate tendinosis of the supraspinatus tendon with a tiny insertional interstitial tear. 2. Moderate tendinosis of the infraspinatus tendon. 3. Moderate tendinosis of the intra-articular portion of the long head of the biceps tendon. 4. Partial-thickness cartilage loss of the glenohumeral joint. CERVICAL Spine: IMPRESSION: 1. C5-C6 moderate spinal canal stenosis with moderate to severe right and mild-to-moderate left neural foraminal narrowing. 2. C4-C5 mild spinal canal stenosis with moderate right and mild left neural foraminal narrowing. 3. C6-C7 mild spinal canal stenosis with mild-to- moderate left and mild right neural foraminal narrowing.    FOTO: 7/22: 61% HAND DOMINANCE:  Right    TREATMENT:                                                                                                                                -  Pt received PROM R shoulder in flexion, scaption, and ER  Supine wand flexion 2x 10 reps Supine active flexion 2x10 Sidelying ER 2x10 Sidelying abduction x10 (challenging) Pulleys flexion and scaption 2' ea Ball roll up wall (pink) x5 (difficult) Standing rows with GTB x 20 reps Standing shoulder ext with GTB x20     PATIENT EDUCATION:  Education details:   Anatomy of condition, POC, HEP, exercise form/rationale.  Person educated: Patient Education method: Explanation, Demonstration, Tactile cues, Verbal cues, and Handouts Education comprehension: verbalized understanding, returned demonstration, verbal cues required, tactile cues required, and needs further education  HOME EXERCISE PROGRAM: [id number] (emailed)   ASSESSMENT:  CLINICAL IMPRESSION: Pt able to complete AROM without significant increase in pain. With PROM, he did have some discomfort in end ranges. Also has tenderness to deltoid m. Pt is very challenged by overhead ball rolls up wall, though his tolerance for this has increased since last few visits. Reported fatigue and mild discomfort following unresisted elbow flexion. Pt reports he uses up to 40lbs for bicep curls when working out in the gym. Advised pt to decrease intensity of his workouts since his recent fall .Pt will continue to benefit from PT to improve    OBJECTIVE IMPAIRMENTS: decreased activity tolerance, decreased ROM, decreased strength, increased muscle spasms, impaired sensation, impaired UE functional use, improper body mechanics, postural dysfunction, and pain.   ACTIVITY LIMITATIONS: carrying, lifting, bed mobility, bathing, dressing, reach over head, and hygiene/grooming  PARTICIPATION LIMITATIONS: meal prep, cleaning, driving, and community activity  PERSONAL FACTORS:  see PMH  are also affecting patient's functional outcome.   REHAB  POTENTIAL: Good  CLINICAL DECISION MAKING: Evolving/moderate complexity  EVALUATION COMPLEXITY: Moderate   GOALS: Goals reviewed with patient? Yes  SHORT TERM GOALS: Target date: 7/20  Pull a towel behind back with minimal to no difficulty Baseline: Goal status: INITIAL  2.  Increase AROM flexion against gravity by at least 10 deg Baseline:  Goal status: INITIAL    LONG TERM GOALS: Target date: POC date  Lift 10lb to return to [facility] shooting (fall) Baseline:  Goal status: INITIAL  2.  AROM to overhead to brush hair Baseline:  Goal status: INITIAL  3.  Meet FOTO goal Baseline:  Goal status: IN PROGRESS 7/22  4.  Straight plane MMT to at least 75% of opp UE Baseline:  Goal status: INITIAL  5.  Return to weight training in gym with proper form Baseline:  Goal status: INITIAL    PLAN:  PT FREQUENCY: 1-2x/week  PT DURATION: 8 weeks  PLANNED INTERVENTIONS: Therapeutic exercises, Therapeutic activity, Neuromuscular re-education, Patient/Family education, Self Care, Joint mobilization, Aquatic Therapy, Dry Needling, Electrical stimulation, Spinal mobilization, Cryotherapy, Moist heat, scar mobilization, Taping, Ultrasound, Ionotophoresis 4mg /ml Dexamethasone, Manual therapy, and Re-evaluation.  PLAN FOR NEXT SESSION:  Cont with shoulder AROM/AAROM/PROM and scapular exercises as appropriate per healing constraints of cervical fusion protocol.  Riki Altes, PTA  03/07/23 3:56 PM

## 2023-03-09 ENCOUNTER — Ambulatory Visit (HOSPITAL_BASED_OUTPATIENT_CLINIC_OR_DEPARTMENT_OTHER): Payer: Medicare HMO

## 2023-03-09 ENCOUNTER — Encounter (HOSPITAL_BASED_OUTPATIENT_CLINIC_OR_DEPARTMENT_OTHER): Payer: Self-pay

## 2023-03-09 DIAGNOSIS — R293 Abnormal posture: Secondary | ICD-10-CM

## 2023-03-09 DIAGNOSIS — M6281 Muscle weakness (generalized): Secondary | ICD-10-CM

## 2023-03-09 DIAGNOSIS — G8929 Other chronic pain: Secondary | ICD-10-CM

## 2023-03-09 DIAGNOSIS — M25511 Pain in right shoulder: Secondary | ICD-10-CM | POA: Diagnosis not present

## 2023-03-09 NOTE — Therapy (Signed)
OUTPATIENT PHYSICAL THERAPY TREATMENT   Patient Name: Victor Medina MRN: [id number] DOB:1946-07-05, 77 y.o., male Today's Date: 03/09/2023  END OF SESSION:  PT End of Session - 03/09/23 1726     Visit Number 8    Number of Visits 17    Date for PT Re-Evaluation 04/02/23    Authorization Type Aetna    PT Start Time 1531    PT Stop Time 1600    PT Time Calculation (min) 29 min    Activity Tolerance Patient tolerated treatment well    Behavior During Therapy [facility] for tasks assessed/performed               PT End of Session - 02/24/23        Visit Number 5     Number of Visits 17     Date for PT Re-Evaluation 04/02/23     Authorization Type Aet[employer]     PT Start Time 1415     PT Stop Time 1449     PT Time Calculation (min) 34 min     Activity Tolerance Patient tolerated treatment well     Behavior During Therapy WFL for tasks assessed/performed         Past Medical History:  Diagnosis Date   Acquired dilation of ascending aorta and aortic root (HCC)    41 mm and 40 mm respectively   Arthritis    Asthma    Cancer (HCC)    mild skin   Chronic diastolic CHF (congestive heart failure) (HCC)    GERD (gastroesophageal reflux disease)    Hypertension    Morbid obesity (HCC)    OSA (obstructive sleep apnea)    PAF (paroxysmal atrial fibrillation) (HCC) 08/14/2015   s/p TEE/DCCV to NSR;  denies palplita    RBBB    atrial fib on cardizem   Past Surgical History:  Procedure Laterality Date   ANTERIOR CERVICAL DECOMP/DISCECTOMY FUSION N/A 01/13/2023   Procedure: CERVICAL 4- CERVICAL 5, CERVICAL 5- CERVICAL 6 ANTERIOR CERVICAL DECOMPRESSION FUSION WITH INSTRUMENTATION AND ALLOGRAFT;  Surgeon: Estill Bamberg, MD;  Location: [facility] OR;  Service: Orthopedics;  Laterality: N/A;   CARDIOVERSION N/A 05/19/2015   Procedure: CARDIOVERSION;  Surgeon: Pricilla Riffle, MD;  Location: [facility] ENDOSCOPY;  Service: Cardiovascular;  Laterality: N/A;   CARDIOVERSION N/A 12/17/2021   Procedure:  CARDIOVERSION;  Surgeon: Pricilla Riffle, MD;  Location: [facility] ENDOSCOPY;  Service: Cardiovascular;  Laterality: N/A;   CATARACT EXTRACTION W/ INTRAOCULAR LENS IMPLANT Right    CHOLECYSTECTOMY N/A 12/01/2012   Procedure: LAPAROSCOPIC CHOLECYSTECTOMY;  Surgeon: Cherylynn Ridges, MD;  Location: [facility] OR;  Service: General;  Laterality: N/A;   COLONOSCOPY WITH PROPOFOL N/A 07/13/2019   Procedure: COLONOSCOPY WITH PROPOFOL;  Surgeon: Jeani Hawking, MD;  Location: [facility] ENDOSCOPY;  Service: Endoscopy;  Laterality: N/A;   MASS EXCISION Right 02/20/2018   Procedure: RIGHT THUMB EXCISION MASS AND DEBRIDEMENT DISTAL INTRPHLANGEAL JOINT;  Surgeon: Betha Loa, MD;  Location: [facility];  Service: Orthopedics;  Laterality: Right;   POLYPECTOMY  07/13/2019   Procedure: POLYPECTOMY;  Surgeon: Jeani Hawking, MD;  Location: [facility] ENDOSCOPY;  Service: Endoscopy;;   right knee     meniscus removal 6 years ago   SHOULDER SURGERY Left    Rotator Cuff Repair   TEE WITHOUT CARDIOVERSION N/A 05/19/2015   Procedure: TRANSESOPHAGEAL ECHOCARDIOGRAM (TEE);  Surgeon: Pricilla Riffle, MD;  Location: [facility] ENDOSCOPY;  Service: Cardiovascular;  Laterality: N/A;   Patient Active Problem List   Diagnosis  Date Noted   Cough 07/08/2022   Wellness examination 01/19/2022   Persistent atrial fibrillation (HCC) 11/27/2021   Bilateral knee pain 09/29/2021   Hyperlipidemia 09/20/2018   PAF (paroxysmal atrial fibrillation) (HCC) 08/14/2015   Acquired dilation of ascending aorta and aortic root (HCC) 10/26/2013   Edema of extremities 10/26/2013   HTN (hypertension) 10/26/2013   Chronic diastolic CHF (congestive heart failure) (HCC)    RBBB    Obstructive sleep apnea 08/16/2013   Morbid obesity (HCC) 08/16/2013   Rotator cuff disorder 08/16/2013   Cholecystitis, acute s/p laparoscopic cholecystectomy 12/01/12 12/02/2012     REFERRING PROVIDER:  Huel Cote, MD    REFERRING DIAG:  M75.101 (ICD-10-CM) - Nontraumatic tear  of right rotator cuff, unspecified tear extent    Rationale for Evaluation and Treatment: Rehabilitation  THERAPY DIAG:  Chronic right shoulder pain  Abnormal posture  Muscle weakness (generalized)  ONSET DATE: about 2 years ago   SUBJECTIVE:                                                                                                                                                                                           SUBJECTIVE STATEMENT: Pt reports he tried some arm machines at [facility] which went well. "I caould reach up to grab the bar which I couldn't do before."   PERTINENT HISTORY:  C4-6 ACDF 01/13/23 A-fib L rotator cuff tear and surgery at least 10 years ago  PAIN: 0/10 R shoulder and cervical pain   PRECAUTIONS:  ACDF 01/13/23  WEIGHT BEARING RESTRICTIONS:  Yes NWB and 10 lb due to cervical surgery  FALLS:  Has patient fallen in last 6 months? Yes. Number of falls 1 Fall on 7/26 per pt. Fell on R shoulder at [employer] PLOF:  Independent  PATIENT GOALS:  Back to gym, back to skeet shooting   OBJECTIVE:   DIAGNOSTIC FINDINGS:  Rt GHJ MRI 11/29/22: IMPRESSION: 1. Moderate tendinosis of the supraspinatus tendon with a tiny insertional interstitial tear. 2. Moderate tendinosis of the infraspinatus tendon. 3. Moderate tendinosis of the intra-articular portion of the long head of the biceps tendon. 4. Partial-thickness cartilage loss of the glenohumeral joint. CERVICAL Spine: IMPRESSION: 1. C5-C6 moderate spinal canal stenosis with moderate to severe right and mild-to-moderate left neural foraminal narrowing. 2. C4-C5 mild spinal canal stenosis with moderate right and mild left neural foraminal narrowing. 3. C6-C7 mild spinal canal stenosis with mild-to- moderate left and mild right neural foraminal narrowing.    FOTO: 7/22: 61% HAND DOMINANCE:  Right    TREATMENT:                                                                                                                                -  Pt received PROM R shoulder in flexion, scaption, and ER  Supine wand flexion 2x 10 reps Supine active flexion 2x10 Sidelying ER 2x10 Sidelying abduction 2x10 Pulleys flexion and scaption 2' ea Ball roll up wall (pink) 2x5 Standing rows with GTB 2x15  reps Standing shoulder ext with GTB 2x15     PATIENT EDUCATION:  Education details:   Anatomy of condition, POC, HEP, exercise form/rationale.  Person educated: Patient Education method: Explanation, Demonstration, Tactile cues, Verbal cues, and Handouts Education comprehension: verbalized understanding, returned demonstration, verbal cues required, tactile cues required, and needs further education  HOME EXERCISE PROGRAM: [id number] (emailed)   ASSESSMENT:  CLINICAL IMPRESSION: Pt did not realize his appt was today and was late to session. Treatment was limited due to this. He appears to be improving with AROM range and endurance. Did have some fatigue today due to working out prior to session. He had significant improvement in ability for overhead ball roll today compared to last session. Pt improving with available pain free ROM. Did have some tightness into passive IR.    OBJECTIVE IMPAIRMENTS: decreased activity tolerance, decreased ROM, decreased strength, increased muscle spasms, impaired sensation, impaired UE functional use, improper body mechanics, postural dysfunction, and pain.   ACTIVITY LIMITATIONS: carrying, lifting, bed mobility, bathing, dressing, reach over head, and hygiene/grooming  PARTICIPATION LIMITATIONS: meal prep, cleaning, driving, and community activity  PERSONAL FACTORS:  see PMH  are also affecting patient's functional outcome.   REHAB POTENTIAL: Good  CLINICAL DECISION MAKING: Evolving/moderate complexity  EVALUATION COMPLEXITY: Moderate   GOALS: Goals reviewed with patient? Yes  SHORT TERM GOALS: Target date: 7/20  Pull a towel behind back  with minimal to no difficulty Baseline: Goal status: INITIAL  2.  Increase AROM flexion against gravity by at least 10 deg Baseline:  Goal status: INITIAL    LONG TERM GOALS: Target date: POC date  Lift 10lb to return to [facility] shooting (fall) Baseline:  Goal status: INITIAL  2.  AROM to overhead to brush hair Baseline:  Goal status: INITIAL  3.  Meet FOTO goal Baseline:  Goal status: IN PROGRESS 7/22  4.  Straight plane MMT to at least 75% of opp UE Baseline:  Goal status: INITIAL  5.  Return to weight training in gym with proper form Baseline:  Goal status: INITIAL    PLAN:  PT FREQUENCY: 1-2x/week  PT DURATION: 8 weeks  PLANNED INTERVENTIONS: Therapeutic exercises, Therapeutic activity, Neuromuscular re-education, Patient/Family education, Self Care, Joint mobilization, Aquatic Therapy, Dry Needling, Electrical stimulation, Spinal mobilization, Cryotherapy, Moist heat, scar mobilization, Taping, Ultrasound, Ionotophoresis 4mg /ml Dexamethasone, Manual therapy, and Re-evaluation.  PLAN FOR NEXT SESSION:  Cont with shoulder AROM/AAROM/PROM and scapular exercises as appropriate per healing constraints of cervical fusion protocol.  Riki Altes, PTA  03/09/23 5:33 PM

## 2023-03-10 ENCOUNTER — Encounter (HOSPITAL_BASED_OUTPATIENT_CLINIC_OR_DEPARTMENT_OTHER): Payer: Self-pay | Admitting: Orthopaedic Surgery

## 2023-03-10 ENCOUNTER — Encounter (HOSPITAL_BASED_OUTPATIENT_CLINIC_OR_DEPARTMENT_OTHER): Payer: Medicare HMO | Admitting: Physical Therapy

## 2023-03-10 DIAGNOSIS — R69 Illness, unspecified: Secondary | ICD-10-CM | POA: Diagnosis not present

## 2023-03-11 ENCOUNTER — Ambulatory Visit (HOSPITAL_BASED_OUTPATIENT_CLINIC_OR_DEPARTMENT_OTHER): Payer: Medicare HMO | Admitting: Student

## 2023-03-11 ENCOUNTER — Encounter (HOSPITAL_BASED_OUTPATIENT_CLINIC_OR_DEPARTMENT_OTHER): Payer: Self-pay | Admitting: Student

## 2023-03-11 DIAGNOSIS — M25562 Pain in left knee: Secondary | ICD-10-CM | POA: Diagnosis not present

## 2023-03-11 DIAGNOSIS — M25561 Pain in right knee: Secondary | ICD-10-CM

## 2023-03-11 DIAGNOSIS — M170 Bilateral primary osteoarthritis of knee: Secondary | ICD-10-CM | POA: Diagnosis not present

## 2023-03-11 MED ORDER — LIDOCAINE HCL 1 % IJ SOLN
4.0000 mL | INTRAMUSCULAR | Status: AC | PRN
Start: 2023-03-11 — End: 2023-03-11
  Administered 2023-03-11: 4 mL

## 2023-03-11 MED ORDER — TRIAMCINOLONE ACETONIDE 40 MG/ML IJ SUSP
2.0000 mL | INTRAMUSCULAR | Status: AC | PRN
Start: 2023-03-11 — End: 2023-03-11
  Administered 2023-03-11: 2 mL via INTRA_ARTICULAR

## 2023-03-11 NOTE — Progress Notes (Signed)
Chief Complaint: Bilateral knee pain     History of Present Illness:    Victor Medina is a 77 y.o. male presents today for evaluation of both knees.  He has had pain now for years and has known osteoarthritis bilaterally.  States that recently this is continued worsening and he has not been getting any relief.  He last had injections on 08/11/2022 which gave him about 3 months of relief.  Has been taking Tylenol with no effect.  States that he has been told he is a candidate for knee replacement, however this was tabled recently due to a cervical fusion in June.  He does report that he is down 20 pounds and is continuing effort to lose weight.   Surgical History:   Right knee menisectomy  PMH/PSH/Family History/Social History/Meds/Allergies:    Past Medical History:  Diagnosis Date   Acquired dilation of ascending aorta and aortic root (HCC)    41 mm and 40 mm respectively   Arthritis    Asthma    Cancer (HCC)    mild skin   Chronic diastolic CHF (congestive heart failure) (HCC)    GERD (gastroesophageal reflux disease)    Hypertension    Morbid obesity (HCC)    OSA (obstructive sleep apnea)    PAF (paroxysmal atrial fibrillation) (HCC) 08/14/2015   s/p TEE/DCCV to NSR;  denies palplita    RBBB    atrial fib on cardizem   Past Surgical History:  Procedure Laterality Date   ANTERIOR CERVICAL DECOMP/DISCECTOMY FUSION N/A 01/13/2023   Procedure: CERVICAL 4- CERVICAL 5, CERVICAL 5- CERVICAL 6 ANTERIOR CERVICAL DECOMPRESSION FUSION WITH INSTRUMENTATION AND ALLOGRAFT;  Surgeon: Estill Bamberg, MD;  Location: [facility] OR;  Service: Orthopedics;  Laterality: N/A;   CARDIOVERSION N/A 05/19/2015   Procedure: CARDIOVERSION;  Surgeon: Pricilla Riffle, MD;  Location: [facility] ENDOSCOPY;  Service: Cardiovascular;  Laterality: N/A;   CARDIOVERSION N/A 12/17/2021   Procedure: CARDIOVERSION;  Surgeon: Pricilla Riffle, MD;  Location: [facility] ENDOSCOPY;  Service: Cardiovascular;   Laterality: N/A;   CATARACT EXTRACTION W/ INTRAOCULAR LENS IMPLANT Right    CHOLECYSTECTOMY N/A 12/01/2012   Procedure: LAPAROSCOPIC CHOLECYSTECTOMY;  Surgeon: Cherylynn Ridges, MD;  Location: [facility] OR;  Service: General;  Laterality: N/A;   COLONOSCOPY WITH PROPOFOL N/A 07/13/2019   Procedure: COLONOSCOPY WITH PROPOFOL;  Surgeon: Jeani Hawking, MD;  Location: [facility] ENDOSCOPY;  Service: Endoscopy;  Laterality: N/A;   MASS EXCISION Right 02/20/2018   Procedure: RIGHT THUMB EXCISION MASS AND DEBRIDEMENT DISTAL INTRPHLANGEAL JOINT;  Surgeon: Betha Loa, MD;  Location: [facility];  Service: Orthopedics;  Laterality: Right;   POLYPECTOMY  07/13/2019   Procedure: POLYPECTOMY;  Surgeon: Jeani Hawking, MD;  Location: [facility] ENDOSCOPY;  Service: Endoscopy;;   right knee     meniscus removal 6 years ago   SHOULDER SURGERY Left    Rotator Cuff Repair   TEE WITHOUT CARDIOVERSION N/A 05/19/2015   Procedure: TRANSESOPHAGEAL ECHOCARDIOGRAM (TEE);  Surgeon: Pricilla Riffle, MD;  Location: [facility] ENDOSCOPY;  Service: Cardiovascular;  Laterality: N/A;   Social History   Socioeconomic History   Marital status: Married    Spouse name: Not on file   Number of children: Not on file   Years of education: Not on file   Highest education level: Associate degree: occupational, technical, or  vocational program  Occupational History   Not on file  Tobacco Use   Smoking status: Former    Types: Cigarettes   Smokeless tobacco: Never   Tobacco comments:    Quit 45 years ago as of 2024  Vaping Use   Vaping status: Never Used  Substance and Sexual Activity   Alcohol use: Not Currently   Drug use: No   Sexual activity: Never  Other Topics Concern   Not on file  Social History Narrative   Not on file   Social Determinants of Health   Financial Resource Strain: Low Risk  (01/28/2023)   Overall Financial Resource Strain (CARDIA)    Difficulty of Paying Living Expenses: Not hard at all  Food Insecurity: No  Food Insecurity (01/28/2023)   Hunger Vital Sign    Worried About Running Out of Food in the Last Year: Never true    Ran Out of Food in the Last Year: Never true  Transportation Needs: No Transportation Needs (01/28/2023)   PRAPARE - Administrator, Civil Service (Medical): No    Lack of Transportation (Non-Medical): No  Physical Activity: Sufficiently Active (01/28/2023)   Exercise Vital Sign    Days of Exercise per Week: 3 days    Minutes of Exercise per Session: 60 min  Stress: No Stress Concern Present (01/28/2023)   [employer] of Occupational Health - Occupational Stress Questionnaire    Feeling of Stress : Not at all  Social Connections: Moderately Isolated (01/28/2023)   Social Connection and Isolation Panel [NHANES]    Frequency of Communication with Friends and Family: Twice a week    Frequency of Social Gatherings with Friends and Family: Once a week    Attends Religious Services: Never    Database administrator or Organizations: No    Attends Engineer, structural: Never    Marital Status: Married   Family History  Problem Relation Age of Onset   Hypotension Mother    Heart disease Sister    Allergies  Allergen Reactions   Lipitor [Atorvastatin] Other (See Comments)    Painful joints    Current Outpatient Medications  Medication Sig Dispense Refill   acetaminophen (TYLENOL) 500 MG tablet Take 1,000 mg by mouth every 6 (six) hours as needed for moderate pain.     apixaban (ELIQUIS) 5 MG TABS tablet Take 5 mg by mouth 2 (two) times daily.     APPLE CIDER VINEGAR PO Take 2 capsules by mouth daily.     Cholecalciferol (VITAMIN D3) 2000 UNITS TABS Take 4,000 Units by mouth daily.      diltiazem (CARDIZEM CD) 120 MG 24 hr capsule Take 1 capsule (120 mg total) by mouth 2 (two) times daily. 90 capsule 3   dronedarone (MULTAQ) 400 MG tablet Take 1 tablet (400 mg total) by mouth 2 (two) times daily with a meal. 30 tablet 0   fluticasone (FLONASE) 50  MCG/ACT nasal spray Place 1 spray into both nostrils at bedtime.     furosemide (LASIX) 20 MG tablet Take 1 tablet (20 mg total) by mouth daily. Take 2 tabs daily for 3 days, then reduce dose to one tablet daily. (Patient not taking: Reported on 02/22/2023) 90 tablet 3   hydrochlorothiazide (HYDRODIURIL) 25 MG tablet Take 1 tablet (25 mg total) by mouth daily. 90 tablet 3   HYDROcodone-acetaminophen (NORCO/VICODIN) 5-325 MG tablet Take 1 tablet by mouth every 6 (six) hours as needed for moderate pain or severe pain. 20  tablet 0   loratadine (CLARITIN) 10 MG tablet Take 10 mg by mouth daily.     Magnesium 250 MG TABS Take 250 mg by mouth 2 (two) times daily.     methocarbamol (ROBAXIN) 750 MG tablet Take 1 tablet (750 mg total) by mouth every 6 (six) hours as needed for muscle spasms. 60 tablet 0   metoprolol tartrate (LOPRESSOR) 25 MG tablet TAKE 1 AND 1/2 TABLETS BY MOUTH TWICE DAILY 270 tablet 1   Multiple Vitamins-Minerals (MULTIVITAMIN ADULT) CHEW Chew 2 each by mouth daily.     tamsulosin (FLOMAX) 0.4 MG CAPS capsule TAKE 1 CAPSULE BY MOUTH EVERYDAY AT BEDTIME 90 capsule 0   trolamine salicylate (ASPERCREME) 10 % cream Apply 1 application topically as needed for muscle pain.     WIXELA INHUB 100-50 MCG/ACT AEPB INHALE 1 PUFF INTO THE LUNGS TWICE A DAY 60 each 3   No current facility-administered medications for this visit.   No results found.  Review of Systems:   A ROS was performed including pertinent positives and negatives as documented in the HPI.  Physical Exam :   Constitutional: NAD and appears stated age Neurological: Alert and oriented Psych: Appropriate affect and cooperative There were no vitals taken for this visit.   Comprehensive Musculoskeletal Exam:    Positive medial joint line tenderness of bilateral knees.  Mild effusions present.  Active range of motion from 0-110 degrees bilaterally with palpable and audible crepitus.  No instability with varus or valgus  stress.  Imaging:   Xray review from 09/30/2021 (right knee 4 views, left knee 4 views): Bilateral tricompartmental osteoarthritis that is bone-on-bone is both medial compartments.  Diffuse peripheral osteophytes.    I personally reviewed and interpreted the radiographs.   Assessment:   77 y.o. male with severe bilateral knee osteoarthritis.  We discussed today that he is very much candidate for knee replacement, and therefore I would like to have him referred over to our partner Dr. Magnus Ivan for further evaluation and discussion of this.  BMI calculated today is approximately 42, and patient is aware that more weight loss will be made to proceed with this.  He does go to the gym here at [employer] 3 times a week.  Recommend that we could perform injections of both knees today for pain and symptom relief.  Patient is agreeable to this plan and this was performed today without any complication.  Will have patient scheduled with Dr. Magnus Ivan for follow-up.  Plan :    - Cortisone injections performed of bilateral knees today under ultrasound-guidance - Referral to Dr Magnus Ivan for discussion of knee replacement    Procedure Note  Patient: Victor Medina             Date of Birth: Mar 02, 1946           MRN: [id number]             Visit Date: 03/11/2023  Procedures: Visit Diagnoses:  1. Bilateral primary osteoarthritis of knee     Large Joint Inj: bilateral knee on 03/11/2023 12:52 PM Indications: pain Details: 22 G 1.5 in needle, ultrasound-guided anterolateral approach Medications (Right): 4 mL lidocaine 1 %; 2 mL triamcinolone acetonide 40 MG/ML Medications (Left): 4 mL lidocaine 1 %; 2 mL triamcinolone acetonide 40 MG/ML Outcome: tolerated well, no immediate complications Procedure, treatment alternatives, risks and benefits explained, specific risks discussed. Consent was given by the patient. Immediately prior to procedure a time out was called to verify the correct patient,  procedure, equipment, support staff and site/side marked as required. Patient was prepped and draped in the usual sterile fashion.       I personally saw and evaluated the patient, and participated in the management and treatment plan.  Hazle Nordmann, PA-C Orthopedics  This document was dictated using Conservation officer, historic buildings. A reasonable attempt at proof reading has been made to minimize errors.

## 2023-03-14 ENCOUNTER — Ambulatory Visit (HOSPITAL_BASED_OUTPATIENT_CLINIC_OR_DEPARTMENT_OTHER): Payer: Medicare HMO | Attending: Orthopaedic Surgery

## 2023-03-14 ENCOUNTER — Encounter (HOSPITAL_BASED_OUTPATIENT_CLINIC_OR_DEPARTMENT_OTHER): Payer: Self-pay

## 2023-03-14 DIAGNOSIS — G8929 Other chronic pain: Secondary | ICD-10-CM | POA: Diagnosis not present

## 2023-03-14 DIAGNOSIS — R293 Abnormal posture: Secondary | ICD-10-CM | POA: Diagnosis not present

## 2023-03-14 DIAGNOSIS — M6281 Muscle weakness (generalized): Secondary | ICD-10-CM | POA: Diagnosis not present

## 2023-03-14 DIAGNOSIS — M25511 Pain in right shoulder: Secondary | ICD-10-CM | POA: Insufficient documentation

## 2023-03-14 NOTE — Therapy (Signed)
OUTPATIENT PHYSICAL THERAPY TREATMENT   Patient Name: Victor Medina MRN: [id number] DOB:1946/02/19, 77 y.o., male Today's Date: 03/14/2023  END OF SESSION:  PT End of Session - 03/14/23 1557     Visit Number 9    Number of Visits 17    Date for PT Re-Evaluation 04/02/23    Authorization Type [employer]    PT Start Time 1517    PT Stop Time 1558    PT Time Calculation (min) 41 min    Activity Tolerance Patient tolerated treatment well    Behavior During Therapy [facility] for tasks assessed/performed                PT End of Session - 02/24/23        Visit Number 5     Number of Visits 17     Date for PT Re-Evaluation 04/02/23     Authorization Type [employer]     PT Start Time 1415     PT Stop Time 1449     PT Time Calculation (min) 34 min     Activity Tolerance Patient tolerated treatment well     Behavior During Therapy WFL for tasks assessed/performed         Past Medical History:  Diagnosis Date   Acquired dilation of ascending aorta and aortic root (HCC)    41 mm and 40 mm respectively   Arthritis    Asthma    Cancer (HCC)    mild skin   Chronic diastolic CHF (congestive heart failure) (HCC)    GERD (gastroesophageal reflux disease)    Hypertension    Morbid obesity (HCC)    OSA (obstructive sleep apnea)    PAF (paroxysmal atrial fibrillation) (HCC) 08/14/2015   s/p TEE/DCCV to NSR;  denies palplita    RBBB    atrial fib on cardizem   Past Surgical History:  Procedure Laterality Date   ANTERIOR CERVICAL DECOMP/DISCECTOMY FUSION N/A 01/13/2023   Procedure: CERVICAL 4- CERVICAL 5, CERVICAL 5- CERVICAL 6 ANTERIOR CERVICAL DECOMPRESSION FUSION WITH INSTRUMENTATION AND ALLOGRAFT;  Surgeon: Estill Bamberg, MD;  Location: [facility] OR;  Service: Orthopedics;  Laterality: N/A;   CARDIOVERSION N/A 05/19/2015   Procedure: CARDIOVERSION;  Surgeon: Pricilla Riffle, MD;  Location: [facility] ENDOSCOPY;  Service: Cardiovascular;  Laterality: N/A;   CARDIOVERSION N/A 12/17/2021    Procedure: CARDIOVERSION;  Surgeon: Pricilla Riffle, MD;  Location: [facility] ENDOSCOPY;  Service: Cardiovascular;  Laterality: N/A;   CATARACT EXTRACTION W/ INTRAOCULAR LENS IMPLANT Right    CHOLECYSTECTOMY N/A 12/01/2012   Procedure: LAPAROSCOPIC CHOLECYSTECTOMY;  Surgeon: Cherylynn Ridges, MD;  Location: [facility] OR;  Service: General;  Laterality: N/A;   COLONOSCOPY WITH PROPOFOL N/A 07/13/2019   Procedure: COLONOSCOPY WITH PROPOFOL;  Surgeon: Jeani Hawking, MD;  Location: [facility] ENDOSCOPY;  Service: Endoscopy;  Laterality: N/A;   MASS EXCISION Right 02/20/2018   Procedure: RIGHT THUMB EXCISION MASS AND DEBRIDEMENT DISTAL INTRPHLANGEAL JOINT;  Surgeon: Betha Loa, MD;  Location: [facility];  Service: Orthopedics;  Laterality: Right;   POLYPECTOMY  07/13/2019   Procedure: POLYPECTOMY;  Surgeon: Jeani Hawking, MD;  Location: [facility] ENDOSCOPY;  Service: Endoscopy;;   right knee     meniscus removal 6 years ago   SHOULDER SURGERY Left    Rotator Cuff Repair   TEE WITHOUT CARDIOVERSION N/A 05/19/2015   Procedure: TRANSESOPHAGEAL ECHOCARDIOGRAM (TEE);  Surgeon: Pricilla Riffle, MD;  Location: [facility] ENDOSCOPY;  Service: Cardiovascular;  Laterality: N/A;   Patient Active Problem List  Diagnosis Date Noted   Cough 07/08/2022   Wellness examination 01/19/2022   Persistent atrial fibrillation (HCC) 11/27/2021   Bilateral knee pain 09/29/2021   Hyperlipidemia 09/20/2018   PAF (paroxysmal atrial fibrillation) (HCC) 08/14/2015   Acquired dilation of ascending aorta and aortic root (HCC) 10/26/2013   Edema of extremities 10/26/2013   HTN (hypertension) 10/26/2013   Chronic diastolic CHF (congestive heart failure) (HCC)    RBBB    Obstructive sleep apnea 08/16/2013   Morbid obesity (HCC) 08/16/2013   Rotator cuff disorder 08/16/2013   Cholecystitis, acute s/p laparoscopic cholecystectomy 12/01/12 12/02/2012     REFERRING PROVIDER:  Huel Cote, MD    REFERRING DIAG:  M75.101 (ICD-10-CM) -  Nontraumatic tear of right rotator cuff, unspecified tear extent    Rationale for Evaluation and Treatment: Rehabilitation  THERAPY DIAG:  Chronic right shoulder pain  Abnormal posture  Muscle weakness (generalized)  ONSET DATE: about 2 years ago   SUBJECTIVE:                                                                                                                                                                                           SUBJECTIVE STATEMENT: Pt reports he had knee injections last week which has helped. His shoulder hasn't been bothering him. Esophagus continues to irritate pt.    PERTINENT HISTORY:  C4-6 ACDF 01/13/23 A-fib L rotator cuff tear and surgery at least 10 years ago  PAIN: 0/10 R shoulder and cervical pain   PRECAUTIONS:  ACDF 01/13/23  WEIGHT BEARING RESTRICTIONS:  Yes NWB and 10 lb due to cervical surgery  FALLS:  Has patient fallen in last 6 months? Yes. Number of falls 1 Fall on 7/26 per pt. Fell on R shoulder at [employer] PLOF:  Independent  PATIENT GOALS:  Back to gym, back to skeet shooting   OBJECTIVE:   DIAGNOSTIC FINDINGS:  Rt GHJ MRI 11/29/22: IMPRESSION: 1. Moderate tendinosis of the supraspinatus tendon with a tiny insertional interstitial tear. 2. Moderate tendinosis of the infraspinatus tendon. 3. Moderate tendinosis of the intra-articular portion of the long head of the biceps tendon. 4. Partial-thickness cartilage loss of the glenohumeral joint. CERVICAL Spine: IMPRESSION: 1. C5-C6 moderate spinal canal stenosis with moderate to severe right and mild-to-moderate left neural foraminal narrowing. 2. C4-C5 mild spinal canal stenosis with moderate right and mild left neural foraminal narrowing. 3. C6-C7 mild spinal canal stenosis with mild-to- moderate left and mild right neural foraminal narrowing.    FOTO: 7/22: 61% HAND DOMINANCE:  Right    TREATMENT:                                                                                                                                -  Pt received PROM R shoulder in flexion, scaption, and ER -Manual R pec release   Supine wand flexion 2x 10 reps Supine active flexion 2x10 Sidelying ER 1#  2x10 Sidelying abduction 2x10 Pulleys flexion and scaption 2' ea Ball roll up wall (pink) 3x5 Standing rows with GTB 2x15  reps Standing shoulder ext with GTB 2x15 Pec stretch at doorway 15sec x3 R only Standing cane flexion x10 (tactile cues to decrease shoulder hike) Standing press out x10 (tactile cues to decrease shoulder hike) Posterior shoulder rolls x20     PATIENT EDUCATION:  Education details:   Anatomy of condition, POC, HEP, exercise form/rationale.  Person educated: Patient Education method: Explanation, Demonstration, Tactile cues, Verbal cues, and Handouts Education comprehension: verbalized understanding, returned demonstration, verbal cues required, tactile cues required, and needs further education  HOME EXERCISE PROGRAM: [id number] (emailed)   ASSESSMENT:  CLINICAL IMPRESSION: Tight into pecs so spent time on manual release of this area. Also instructed pt in doorway stretch for him to complete at home. Remains challenged with strength in end range sidelying active abduction. Able to tolerate addition of 1# dumbbell to sidelying ER today. Initiated standing AAROM with significant shoulder hike present. Will continue to work on improving active strengthening as tolerated.     OBJECTIVE IMPAIRMENTS: decreased activity tolerance, decreased ROM, decreased strength, increased muscle spasms, impaired sensation, impaired UE functional use, improper body mechanics, postural dysfunction, and pain.   ACTIVITY LIMITATIONS: carrying, lifting, bed mobility, bathing, dressing, reach over head, and hygiene/grooming  PARTICIPATION LIMITATIONS: meal prep, cleaning, driving, and community activity  PERSONAL FACTORS:  see PMH  are also affecting  patient's functional outcome.   REHAB POTENTIAL: Good  CLINICAL DECISION MAKING: Evolving/moderate complexity  EVALUATION COMPLEXITY: Moderate   GOALS: Goals reviewed with patient? Yes  SHORT TERM GOALS: Target date: 7/20  Pull a towel behind back with minimal to no difficulty Baseline: Goal status: INITIAL  2.  Increase AROM flexion against gravity by at least 10 deg Baseline:  Goal status: INITIAL    LONG TERM GOALS: Target date: POC date  Lift 10lb to return to [facility] shooting (fall) Baseline:  Goal status: INITIAL  2.  AROM to overhead to brush hair Baseline:  Goal status: INITIAL  3.  Meet FOTO goal Baseline:  Goal status: IN PROGRESS 7/22  4.  Straight plane MMT to at least 75% of opp UE Baseline:  Goal status: INITIAL  5.  Return to weight training in gym with proper form Baseline:  Goal status: INITIAL    PLAN:  PT FREQUENCY: 1-2x/week  PT DURATION: 8 weeks  PLANNED INTERVENTIONS: Therapeutic exercises, Therapeutic activity, Neuromuscular re-education, Patient/Family education, Self Care, Joint mobilization, Aquatic Therapy, Dry Needling, Electrical stimulation, Spinal mobilization, Cryotherapy, Moist heat, scar mobilization, Taping, Ultrasound, Ionotophoresis 4mg /ml Dexamethasone, Manual therapy, and Re-evaluation.  PLAN FOR NEXT SESSION:  Cont with shoulder AROM/AAROM/PROM and scapular exercises as appropriate per healing constraints of cervical fusion protocol.  Riki Altes, PTA  03/14/23 4:02 PM

## 2023-03-16 ENCOUNTER — Ambulatory Visit (HOSPITAL_BASED_OUTPATIENT_CLINIC_OR_DEPARTMENT_OTHER): Payer: Medicare HMO | Admitting: Physical Therapy

## 2023-03-16 ENCOUNTER — Encounter (HOSPITAL_BASED_OUTPATIENT_CLINIC_OR_DEPARTMENT_OTHER): Payer: Self-pay | Admitting: Physical Therapy

## 2023-03-16 DIAGNOSIS — G8929 Other chronic pain: Secondary | ICD-10-CM

## 2023-03-16 DIAGNOSIS — R293 Abnormal posture: Secondary | ICD-10-CM | POA: Diagnosis not present

## 2023-03-16 DIAGNOSIS — M6281 Muscle weakness (generalized): Secondary | ICD-10-CM | POA: Diagnosis not present

## 2023-03-16 DIAGNOSIS — M25511 Pain in right shoulder: Secondary | ICD-10-CM | POA: Diagnosis not present

## 2023-03-16 NOTE — Therapy (Signed)
OUTPATIENT PHYSICAL THERAPY TREATMENT   Patient Name: Victor Medina MRN: [id number] DOB:02-17-1946, 77 y.o., male Today's Date: 03/16/2023  END OF SESSION:  PT End of Session - 03/16/23 1452     Visit Number 10    Number of Visits 17    Date for PT Re-Evaluation 04/02/23    Authorization Type Aet[employer]    PT Start Time 1448                PT End of Session - 02/24/23        Visit Number 5     Number of Visits 17     Date for PT Re-Evaluation 04/02/23     Authorization Type [employer]     PT Start Time 1415     PT Stop Time 1449     PT Time Calculation (min) 34 min     Activity Tolerance Patient tolerated treatment well     Behavior During Therapy WFL for tasks assessed/performed         Past Medical History:  Diagnosis Date   Acquired dilation of ascending aorta and aortic root (HCC)    41 mm and 40 mm respectively   Arthritis    Asthma    Cancer (HCC)    mild skin   Chronic diastolic CHF (congestive heart failure) (HCC)    GERD (gastroesophageal reflux disease)    Hypertension    Morbid obesity (HCC)    OSA (obstructive sleep apnea)    PAF (paroxysmal atrial fibrillation) (HCC) 08/14/2015   s/p TEE/DCCV to NSR;  denies palplita    RBBB    atrial fib on cardizem   Past Surgical History:  Procedure Laterality Date   ANTERIOR CERVICAL DECOMP/DISCECTOMY FUSION N/A 01/13/2023   Procedure: CERVICAL 4- CERVICAL 5, CERVICAL 5- CERVICAL 6 ANTERIOR CERVICAL DECOMPRESSION FUSION WITH INSTRUMENTATION AND ALLOGRAFT;  Surgeon: Estill Bamberg, MD;  Location: [facility] OR;  Service: Orthopedics;  Laterality: N/A;   CARDIOVERSION N/A 05/19/2015   Procedure: CARDIOVERSION;  Surgeon: Pricilla Riffle, MD;  [facility]: [facility] ENDOSCOPY;  Service: Cardiovascular;  Laterality: N/A;   CARDIOVERSION N/A 12/17/2021   Procedure: CARDIOVERSION;  Surgeon: Pricilla Riffle, MD;  Location: [facility] ENDOSCOPY;  Service: Cardiovascular;  Laterality: N/A;   CATARACT EXTRACTION W/ INTRAOCULAR LENS IMPLANT Right     CHOLECYSTECTOMY N/A 12/01/2012   Procedure: LAPAROSCOPIC CHOLECYSTECTOMY;  Surgeon: Cherylynn Ridges, MD;  Location: [facility] OR;  Service: General;  Laterality: N/A;   COLONOSCOPY WITH PROPOFOL N/A 07/13/2019   Procedure: COLONOSCOPY WITH PROPOFOL;  Surgeon: Jeani Hawking, MD;  Location: [facility] ENDOSCOPY;  Service: Endoscopy;  Laterality: N/A;   MASS EXCISION Right 02/20/2018   Procedure: RIGHT THUMB EXCISION MASS AND DEBRIDEMENT DISTAL INTRPHLANGEAL JOINT;  Surgeon: Betha Loa, MD;  Location: [facility];  Service: Orthopedics;  Laterality: Right;   POLYPECTOMY  07/13/2019   Procedure: POLYPECTOMY;  Surgeon: Jeani Hawking, MD;  Location: [facility] ENDOSCOPY;  Service: Endoscopy;;   right knee     meniscus removal 6 years ago   SHOULDER SURGERY Left    Rotator Cuff Repair   TEE WITHOUT CARDIOVERSION N/A 05/19/2015   Procedure: TRANSESOPHAGEAL ECHOCARDIOGRAM (TEE);  Surgeon: Pricilla Riffle, MD;  Location: [facility] ENDOSCOPY;  Service: Cardiovascular;  Laterality: N/A;   Patient Active Problem List   Diagnosis Date Noted   Cough 07/08/2022   Wellness examination 01/19/2022   Persistent atrial fibrillation (HCC) 11/27/2021   Bilateral knee pain 09/29/2021   Hyperlipidemia 09/20/2018   PAF (paroxysmal atrial fibrillation) (  HCC) 08/14/2015   Acquired dilation of ascending aorta and aortic root (HCC) 10/26/2013   Edema of extremities 10/26/2013   HTN (hypertension) 10/26/2013   Chronic diastolic CHF (congestive heart failure) (HCC)    RBBB    Obstructive sleep apnea 08/16/2013   Morbid obesity (HCC) 08/16/2013   Rotator cuff disorder 08/16/2013   Cholecystitis, acute s/p laparoscopic cholecystectomy 12/01/12 12/02/2012     REFERRING PROVIDER:  Huel Cote, MD    REFERRING DIAG:  M75.101 (ICD-10-CM) - Nontraumatic tear of right rotator cuff, unspecified tear extent    Rationale for Evaluation and Treatment: Rehabilitation  THERAPY DIAG:  No diagnosis found.  ONSET DATE: about 2 years  ago   SUBJECTIVE:                                                                                                                                                                                           SUBJECTIVE STATEMENT: Pt is limited with reaching overhead though is improving.  He can touch the top of his head now and comb hair.  Pt states he can do things now he could not do prior.  Pt states he is able to reach up to a slot in the car that he could not reach before.  He has improved with driving and is able to reach the top of his steering wheel.  Pt is limited with reaching across his body though is improving.  Pt states he is making slow progress, but it's getting there.  Pt has been working out in the gym approx 3 days per week.  er hasn't been bothering him. Esophagus continues to irritate pt.    PERTINENT HISTORY:  C4-6 ACDF 01/13/23 A-fib L rotator cuff tear and surgery at least 10 years ago  PAIN: 0/10 R shoulder and cervical pain   PRECAUTIONS:  ACDF 01/13/23  WEIGHT BEARING RESTRICTIONS:  Yes NWB and 10 lb due to cervical surgery  FALLS:  Has patient fallen in last 6 months? Yes. Number of falls 1 Fall on 7/26 per pt. Fell on R shoulder at [employer] PLOF:  Independent  PATIENT GOALS:  Back to gym, back to skeet shooting   OBJECTIVE:   DIAGNOSTIC FINDINGS:  Rt GHJ MRI 11/29/22: IMPRESSION: 1. Moderate tendinosis of the supraspinatus tendon with a tiny insertional interstitial tear. 2. Moderate tendinosis of the infraspinatus tendon. 3. Moderate tendinosis of the intra-articular portion of the long head of the biceps tendon. 4. Partial-thickness cartilage loss of the glenohumeral joint. CERVICAL Spine: IMPRESSION: 1. C5-C6 moderate spinal canal stenosis with moderate to severe right and mild-to-moderate left neural foraminal narrowing. 2. C4-C5 mild spinal  canal stenosis with moderate right and mild left neural foraminal narrowing. 3. C6-C7 mild spinal canal  stenosis with mild-to- moderate left and mild right neural foraminal narrowing.    FOTO: 7/22: 61% HAND DOMINANCE:  Right    TREATMENT:                                                                                                                               ROM Right eval  Right  8/7  Shoulder flexion 40 active 140 passive  62/135 deg active in standing/supine PROM:  145 deg  Shoulder extension 50 AROM    Shoulder abduction   58   Shoulder adduction             Shoulder internal rotation L1    Shoulder external rotation   78    (Blank rows = not tested)   Sidelying ER AROM 2x10 Sidelying abduction 2x10 Pulleys flexion and scaption 2' ea Ball roll up wall (pink) 3x5 Standing rows with GTB 2x15  reps Standing shoulder ext with GTB 2x15 Pec stretch at doorway 15sec x3 R only Standing cane flexion x10 (tactile cues to decrease shoulder hike) Standing press out x10 (tactile cues to decrease shoulder hike) Posterior shoulder rolls x20     PATIENT EDUCATION:  Education details:   Anatomy of condition, POC, HEP, exercise form/rationale.  Person educated: Patient Education method: Explanation, Demonstration, Tactile cues, Verbal cues, and Handouts Education comprehension: verbalized understanding, returned demonstration, verbal cues required, tactile cues required, and needs further education  HOME EXERCISE PROGRAM: [id number] (emailed)   ASSESSMENT:  CLINICAL IMPRESSION: Tight into pecs so spent time on manual release of this area. Also instructed pt in doorway stretch for him to complete at home. Remains challenged with strength in end range sidelying active abduction. Able to tolerate addition of 1# dumbbell to sidelying ER today. Initiated standing AAROM with significant shoulder hike present. Will continue to work on improving active strengthening as tolerated.    Pt unable to perform    OBJECTIVE IMPAIRMENTS: decreased activity tolerance, decreased ROM,  decreased strength, increased muscle spasms, impaired sensation, impaired UE functional use, improper body mechanics, postural dysfunction, and pain.   ACTIVITY LIMITATIONS: carrying, lifting, bed mobility, bathing, dressing, reach over head, and hygiene/grooming  PARTICIPATION LIMITATIONS: meal prep, cleaning, driving, and community activity  PERSONAL FACTORS:  see PMH  are also affecting patient's functional outcome.   REHAB POTENTIAL: Good  CLINICAL DECISION MAKING: Evolving/moderate complexity  EVALUATION COMPLEXITY: Moderate   GOALS: Goals reviewed with patient? Yes  SHORT TERM GOALS: Target date: 7/20  Pull a towel behind back with minimal to no difficulty Baseline: Goal status: PARTIALLY MET  8/7  2.  Increase AROM flexion against gravity by at least 10 deg Baseline:  Goal status: GOAL MET  8/7    LONG TERM GOALS: Target date: POC date  Lift 10lb to return to [facility] shooting (fall) Baseline:  Goal status: INITIAL  2.  AROM to overhead to brush hair Baseline:  Goal status: PARTIALLY MET  8/7  3.  Meet FOTO goal Baseline:  Goal status: IN PROGRESS 7/22  4.  Straight plane MMT to at least 75% of opp UE Baseline:  Goal status: INITIAL  5.  Return to weight training in gym with proper form Baseline:  Goal status: INITIAL    PLAN:  PT FREQUENCY: 1-2x/week  PT DURATION: 8 weeks  PLANNED INTERVENTIONS: Therapeutic exercises, Therapeutic activity, Neuromuscular re-education, Patient/Family education, Self Care, Joint mobilization, Aquatic Therapy, Dry Needling, Electrical stimulation, Spinal mobilization, Cryotherapy, Moist heat, scar mobilization, Taping, Ultrasound, Ionotophoresis 4mg /ml Dexamethasone, Manual therapy, and Re-evaluation.  PLAN FOR NEXT SESSION:  Cont with shoulder AROM/AAROM/PROM and scapular exercises as appropriate per healing constraints of cervical fusion protocol.  Riki Altes, PTA  03/16/23 2:52 PM

## 2023-03-17 ENCOUNTER — Encounter (HOSPITAL_BASED_OUTPATIENT_CLINIC_OR_DEPARTMENT_OTHER): Payer: Medicare HMO | Admitting: Physical Therapy

## 2023-03-21 ENCOUNTER — Encounter (HOSPITAL_BASED_OUTPATIENT_CLINIC_OR_DEPARTMENT_OTHER): Payer: Self-pay

## 2023-03-21 ENCOUNTER — Ambulatory Visit (HOSPITAL_BASED_OUTPATIENT_CLINIC_OR_DEPARTMENT_OTHER): Payer: Medicare HMO

## 2023-03-21 DIAGNOSIS — R293 Abnormal posture: Secondary | ICD-10-CM | POA: Diagnosis not present

## 2023-03-21 DIAGNOSIS — M6281 Muscle weakness (generalized): Secondary | ICD-10-CM | POA: Diagnosis not present

## 2023-03-21 DIAGNOSIS — M25511 Pain in right shoulder: Secondary | ICD-10-CM | POA: Diagnosis not present

## 2023-03-21 DIAGNOSIS — G8929 Other chronic pain: Secondary | ICD-10-CM

## 2023-03-21 DIAGNOSIS — M5412 Radiculopathy, cervical region: Secondary | ICD-10-CM | POA: Diagnosis not present

## 2023-03-21 NOTE — Therapy (Signed)
OUTPATIENT PHYSICAL THERAPY TREATMENT / PROGRESS NOTE Progress Note Reporting Period 02/01/2023 to 03/16/2023  See note below for Objective Data and Assessment of Progress/Goals.       Patient Name: Victor Medina MRN: [id number] DOB:03/15/1946, 77 y.o., male Today's Date: 03/21/2023  END OF SESSION:  PT End of Session - 03/21/23 1500     Visit Number 11    Number of Visits 20    Date for PT Re-Evaluation 04/20/23    Authorization Type [employer]    PT Start Time 1512    PT Stop Time 1558    PT Time Calculation (min) 46 min    Activity Tolerance Patient tolerated treatment well    Behavior During Therapy [facility] for tasks assessed/performed                 PT End of Session - 02/24/23        Visit Number 10    Number of Visits 17     Date for PT Re-Evaluation 04/20/23     Authorization Type [employer]     PT Start Time 1448     PT Stop Time 1530     PT Time Calculation (min) 42 min     Activity Tolerance Patient tolerated treatment well     Behavior During Therapy WFL for tasks assessed/performed         Past Medical History:  Diagnosis Date   Acquired dilation of ascending aorta and aortic root (HCC)    41 mm and 40 mm respectively   Arthritis    Asthma    Cancer (HCC)    mild skin   Chronic diastolic CHF (congestive heart failure) (HCC)    GERD (gastroesophageal reflux disease)    Hypertension    Morbid obesity (HCC)    OSA (obstructive sleep apnea)    PAF (paroxysmal atrial fibrillation) (HCC) 08/14/2015   s/p TEE/DCCV to NSR;  denies palplita    RBBB    atrial fib on cardizem   Past Surgical History:  Procedure Laterality Date   ANTERIOR CERVICAL DECOMP/DISCECTOMY FUSION N/A 01/13/2023   Procedure: CERVICAL 4- CERVICAL 5, CERVICAL 5- CERVICAL 6 ANTERIOR CERVICAL DECOMPRESSION FUSION WITH INSTRUMENTATION AND ALLOGRAFT;  Surgeon: Estill Bamberg, MD;  Location: [facility] OR;  Service: Orthopedics;  Laterality: N/A;   CARDIOVERSION N/A 05/19/2015   Procedure:  CARDIOVERSION;  Surgeon: Pricilla Riffle, MD;  Location: [facility] ENDOSCOPY;  Service: Cardiovascular;  Laterality: N/A;   CARDIOVERSION N/A 12/17/2021   Procedure: CARDIOVERSION;  Surgeon: Pricilla Riffle, MD;  Location: [facility] ENDOSCOPY;  Service: Cardiovascular;  Laterality: N/A;   CATARACT EXTRACTION W/ INTRAOCULAR LENS IMPLANT Right    CHOLECYSTECTOMY N/A 12/01/2012   Procedure: LAPAROSCOPIC CHOLECYSTECTOMY;  Surgeon: Cherylynn Ridges, MD;  Location: [facility] OR;  Service: General;  Laterality: N/A;   COLONOSCOPY WITH PROPOFOL N/A 07/13/2019   Procedure: COLONOSCOPY WITH PROPOFOL;  Surgeon: Jeani Hawking, MD;  Location: [facility] ENDOSCOPY;  Service: Endoscopy;  Laterality: N/A;   MASS EXCISION Right 02/20/2018   Procedure: RIGHT THUMB EXCISION MASS AND DEBRIDEMENT DISTAL INTRPHLANGEAL JOINT;  Surgeon: Betha Loa, MD;  Location: [facility];  Service: Orthopedics;  Laterality: Right;   POLYPECTOMY  07/13/2019   Procedure: POLYPECTOMY;  Surgeon: Jeani Hawking, MD;  Location: [facility] ENDOSCOPY;  Service: Endoscopy;;   right knee     meniscus removal 6 years ago   SHOULDER SURGERY Left    Rotator Cuff Repair   TEE WITHOUT CARDIOVERSION N/A 05/19/2015   Procedure: TRANSESOPHAGEAL ECHOCARDIOGRAM (  TEE);  Surgeon: Pricilla Riffle, MD;  Location: [facility] ENDOSCOPY;  Service: Cardiovascular;  Laterality: N/A;   Patient Active Problem List   Diagnosis Date Noted   Cough 07/08/2022   Wellness examination 01/19/2022   Persistent atrial fibrillation (HCC) 11/27/2021   Bilateral knee pain 09/29/2021   Hyperlipidemia 09/20/2018   PAF (paroxysmal atrial fibrillation) (HCC) 08/14/2015   Acquired dilation of ascending aorta and aortic root (HCC) 10/26/2013   Edema of extremities 10/26/2013   HTN (hypertension) 10/26/2013   Chronic diastolic CHF (congestive heart failure) (HCC)    RBBB    Obstructive sleep apnea 08/16/2013   Morbid obesity (HCC) 08/16/2013   Rotator cuff disorder 08/16/2013   Cholecystitis, acute s/p  laparoscopic cholecystectomy 12/01/12 12/02/2012     REFERRING PROVIDER:  Huel Cote, MD    REFERRING DIAG:  M75.101 (ICD-10-CM) - Nontraumatic tear of right rotator cuff, unspecified tear extent    Rationale for Evaluation and Treatment: Rehabilitation  THERAPY DIAG:  Muscle weakness (generalized)  Abnormal posture  Chronic right shoulder pain  ONSET DATE: about 2 years ago   SUBJECTIVE:                                                                                                                                                                                           SUBJECTIVE STATEMENT: Pt reports he was released from Dr. Yevette Edwards today for cervical ACDF but does have to continue with bone stimulator until September.   PERTINENT HISTORY:  C4-6 ACDF 01/13/23 A-fib L rotator cuff tear and surgery at least 10 years ago  PAIN: 0/10 R shoulder and cervical pain   PRECAUTIONS:  ACDF 01/13/23  WEIGHT BEARING RESTRICTIONS:  Yes NWB and 10 lb due to cervical surgery  FALLS:  Has patient fallen in last 6 months? Yes. Number of falls 1 Fall on 7/26 per pt. Fell on R shoulder at [employer] PLOF:  Independent  PATIENT GOALS:  Back to gym, back to skeet shooting   OBJECTIVE:    ROM Right eval  Right  8/7  Shoulder flexion 40 active 140 passive  62/135 deg active in standing/supine PROM:  145 deg  Shoulder extension 50 AROM    Shoulder abduction   58   Shoulder adduction             Shoulder internal rotation L1    Shoulder external rotation   78    (Blank rows = not tested)  DIAGNOSTIC FINDINGS:  Rt GHJ MRI 11/29/22: IMPRESSION: 1. Moderate tendinosis of the supraspinatus tendon with a tiny insertional interstitial tear. 2. Moderate tendinosis of the infraspinatus tendon. 3. Moderate tendinosis of  the intra-articular portion of the long head of the biceps tendon. 4. Partial-thickness cartilage loss of the glenohumeral joint. CERVICAL  Spine: IMPRESSION: 1. C5-C6 moderate spinal canal stenosis with moderate to severe right and mild-to-moderate left neural foraminal narrowing. 2. C4-C5 mild spinal canal stenosis with moderate right and mild left neural foraminal narrowing. 3. C6-C7 mild spinal canal stenosis with mild-to- moderate left and mild right neural foraminal narrowing.    FOTO: 7/22: 61% HAND DOMINANCE:  Right    TREATMENT:                                                                                                                                -Pt received PROM R shoulder in flexion, scaption, and ER    Supine wand flexion 2x 10 reps Supine active flexion 2x10 Sidelying ER 0#  2x10 (first 4 reps with 1#) Sidelying abduction 2x10 Pulleys flexion and scaption 2' ea Ball roll up wall (pink) 3x5 Standing rows with GTB 2x15  reps Standing shoulder ext with GTB 2x15 Standing cane flexion x10 Standing press out 2x10 (tactile cues to decrease shoulder hike)    PATIENT EDUCATION:  Education details:   Anatomy of condition, POC, HEP, exercise form/rationale, objective findings, goal progress.  Person educated: Patient Education method: Explanation, Demonstration, Tactile cues, Verbal cues, and Handouts Education comprehension: verbalized understanding, returned demonstration, verbal cues required, tactile cues required, and needs further education  HOME EXERCISE PROGRAM: [id number] (emailed)   ASSESSMENT:  CLINICAL IMPRESSION: Pt only able to complete a couple of repetitions of sidelying ER with 1# weight today due to difficulty. He remains challenged by overhead elevation, but notes steady improvement in ADLs. Pt demonstrates improvements in endurance with exercises in clinic. Working towards full AROM against gravity.   OBJECTIVE IMPAIRMENTS: decreased activity tolerance, decreased ROM, decreased strength, increased muscle spasms, impaired sensation, impaired UE functional use, improper body  mechanics, postural dysfunction, and pain.   ACTIVITY LIMITATIONS: carrying, lifting, bed mobility, bathing, dressing, reach over head, and hygiene/grooming  PARTICIPATION LIMITATIONS: meal prep, cleaning, driving, and community activity  PERSONAL FACTORS:  see PMH  are also affecting patient's functional outcome.   REHAB POTENTIAL: Good  CLINICAL DECISION MAKING: Evolving/moderate complexity  EVALUATION COMPLEXITY: Moderate   GOALS: Goals reviewed with patient? Yes  SHORT TERM GOALS: Target date: 7/20  Pull a towel behind back with minimal to no difficulty Baseline: Goal status: PARTIALLY MET  8/7  2.  Increase AROM flexion against gravity by at least 10 deg Baseline:  Goal status: GOAL MET  8/7    LONG TERM GOALS: Target date: 04/20/2023  Lift 10lb to return to [employer] [facility] [employer] [facility] shooting (fall) Baseline:  Goal status: INITIAL  2.  AROM to overhead to brush hair Baseline:  Goal status: PARTIALLY MET  8/7  3.  Meet FOTO goal Baseline:  Goal status: IN PROGRESS 7/22  4.  Straight plane MMT to at least 75% of opp UE Baseline:  Goal  status: INITIAL  5.  Return to weight training in gym with proper form Baseline:  Goal status: PROGRESSING    PLAN:  PT FREQUENCY: 1-2x/week  PT DURATION: 5 weeks  PLANNED INTERVENTIONS: Therapeutic exercises, Therapeutic activity, Neuromuscular re-education, Patient/Family education, Self Care, Joint mobilization, Aquatic Therapy, Dry Needling, Electrical stimulation, Spinal mobilization, Cryotherapy, Moist heat, scar mobilization, Taping, Ultrasound, Ionotophoresis 4mg /ml Dexamethasone, Manual therapy, and Re-evaluation.  PLAN FOR NEXT SESSION:  Cont with shoulder AROM/AAROM/PROM and scapular exercises as appropriate per healing constraints of cervical fusion protocol.   Riki Altes, PTA  03/21/23 4:58 PM

## 2023-03-22 ENCOUNTER — Ambulatory Visit: Payer: Medicare HMO

## 2023-03-23 ENCOUNTER — Ambulatory Visit (HOSPITAL_BASED_OUTPATIENT_CLINIC_OR_DEPARTMENT_OTHER): Payer: Medicare HMO | Admitting: Physical Therapy

## 2023-03-23 ENCOUNTER — Encounter (HOSPITAL_BASED_OUTPATIENT_CLINIC_OR_DEPARTMENT_OTHER): Payer: Self-pay | Admitting: Physical Therapy

## 2023-03-23 DIAGNOSIS — M25511 Pain in right shoulder: Secondary | ICD-10-CM | POA: Diagnosis not present

## 2023-03-23 DIAGNOSIS — G8929 Other chronic pain: Secondary | ICD-10-CM

## 2023-03-23 DIAGNOSIS — R293 Abnormal posture: Secondary | ICD-10-CM

## 2023-03-23 DIAGNOSIS — M6281 Muscle weakness (generalized): Secondary | ICD-10-CM

## 2023-03-23 NOTE — Therapy (Signed)
OUTPATIENT PHYSICAL THERAPY TREATMENT      Patient Name: ADRIEN GREENAWAY MRN: [id number] DOB:27-May-1946, 77 y.o., male Today's Date: 03/24/2023  END OF SESSION:        PT End of Session - 03/23/23        Visit Number 12    Number of Visits 20     Date for PT Re-Evaluation 04/20/23     Authorization Type [employer]     PT Start Time 1536     PT Stop Time 1611     PT Time Calculation (min) 35 min     Activity Tolerance Patient tolerated treatment well     Behavior During Therapy WFL for tasks assessed/performed         Past Medical History:  Diagnosis Date   Acquired dilation of ascending aorta and aortic root (HCC)    41 mm and 40 mm respectively   Arthritis    Asthma    Cancer (HCC)    mild skin   Chronic diastolic CHF (congestive heart failure) (HCC)    GERD (gastroesophageal reflux disease)    Hypertension    Morbid obesity (HCC)    OSA (obstructive sleep apnea)    PAF (paroxysmal atrial fibrillation) (HCC) 08/14/2015   s/p TEE/DCCV to NSR;  denies palplita    RBBB    atrial fib on cardizem   Past Surgical History:  Procedure Laterality Date   ANTERIOR CERVICAL DECOMP/DISCECTOMY FUSION N/A 01/13/2023   Procedure: CERVICAL 4- CERVICAL 5, CERVICAL 5- CERVICAL 6 ANTERIOR CERVICAL DECOMPRESSION FUSION WITH INSTRUMENTATION AND ALLOGRAFT;  Surgeon: Estill Bamberg, MD;  Location: [facility] OR;  Service: Orthopedics;  Laterality: N/A;   CARDIOVERSION N/A 05/19/2015   Procedure: CARDIOVERSION;  Surgeon: Pricilla Riffle, MD;  Location: [facility] ENDOSCOPY;  Service: Cardiovascular;  Laterality: N/A;   CARDIOVERSION N/A 12/17/2021   Procedure: CARDIOVERSION;  Surgeon: Pricilla Riffle, MD;  Location: [facility] ENDOSCOPY;  Service: Cardiovascular;  Laterality: N/A;   CATARACT EXTRACTION W/ INTRAOCULAR LENS IMPLANT Right    CHOLECYSTECTOMY N/A 12/01/2012   Procedure: LAPAROSCOPIC CHOLECYSTECTOMY;  Surgeon: Cherylynn Ridges, MD;  Location: [facility] OR;  Service: General;  Laterality: N/A;   COLONOSCOPY  WITH PROPOFOL N/A 07/13/2019   Procedure: COLONOSCOPY WITH PROPOFOL;  Surgeon: Jeani Hawking, MD;  Location: [facility] ENDOSCOPY;  Service: Endoscopy;  Laterality: N/A;   MASS EXCISION Right 02/20/2018   Procedure: RIGHT THUMB EXCISION MASS AND DEBRIDEMENT DISTAL INTRPHLANGEAL JOINT;  Surgeon: Betha Loa, MD;  Location: [facility];  Service: Orthopedics;  Laterality: Right;   POLYPECTOMY  07/13/2019   Procedure: POLYPECTOMY;  Surgeon: Jeani Hawking, MD;  Location: [facility] ENDOSCOPY;  Service: Endoscopy;;   right knee     meniscus removal 6 years ago   SHOULDER SURGERY Left    Rotator Cuff Repair   TEE WITHOUT CARDIOVERSION N/A 05/19/2015   Procedure: TRANSESOPHAGEAL ECHOCARDIOGRAM (TEE);  Surgeon: Pricilla Riffle, MD;  Location: [facility] ENDOSCOPY;  Service: Cardiovascular;  Laterality: N/A;   Patient Active Problem List   Diagnosis Date Noted   Cough 07/08/2022   Wellness examination 01/19/2022   Persistent atrial fibrillation (HCC) 11/27/2021   Bilateral knee pain 09/29/2021   Hyperlipidemia 09/20/2018   PAF (paroxysmal atrial fibrillation) (HCC) 08/14/2015   Acquired dilation of ascending aorta and aortic root (HCC) 10/26/2013   Edema of extremities 10/26/2013   HTN (hypertension) 10/26/2013   Chronic diastolic CHF (congestive heart failure) (HCC)    RBBB    Obstructive sleep apnea 08/16/2013   Morbid obesity (  HCC) 08/16/2013   Rotator cuff disorder 08/16/2013   Cholecystitis, acute s/p laparoscopic cholecystectomy 12/01/12 12/02/2012     REFERRING PROVIDER:  Huel Cote, MD    REFERRING DIAG:  M75.101 (ICD-10-CM) - Nontraumatic tear of right rotator cuff, unspecified tear extent    Rationale for Evaluation and Treatment: Rehabilitation  THERAPY DIAG:  Chronic right shoulder pain  Abnormal posture  Muscle weakness (generalized)  ONSET DATE: about 2 years ago   SUBJECTIVE:                                                                                                                                                                                            SUBJECTIVE STATEMENT: Pt is unable to reach mirror adjustment in his car.  He is also unable to reach the glove department.  Pt is able to reach the lat pulldown handle now.  Pt denies any adverse effects after prior Rx.  Pt has been working out in the gym approx 3 days per week.     PERTINENT HISTORY:  C4-6 ACDF 01/13/23 A-fib L rotator cuff tear and surgery at least 10 years ago  PAIN: 0/10 R shoulder and cervical pain   PRECAUTIONS:  ACDF 01/13/23  WEIGHT BEARING RESTRICTIONS:  Yes NWB and 10 lb due to cervical surgery  FALLS:  Has patient fallen in last 6 months? Yes. Number of falls 1 Fall on 7/26 per pt. Fell on R shoulder at [employer] PLOF:  Independent  PATIENT GOALS:  Back to gym, back to skeet shooting   OBJECTIVE:    ROM Right eval  Right  8/7  Shoulder flexion 40 active 140 passive  62/135 deg active in standing/supine PROM:  145 deg  Shoulder extension 50 AROM    Shoulder abduction   58   Shoulder adduction             Shoulder internal rotation L1    Shoulder external rotation   78    (Blank rows = not tested)  DIAGNOSTIC FINDINGS:  Rt GHJ MRI 11/29/22: IMPRESSION: 1. Moderate tendinosis of the supraspinatus tendon with a tiny insertional interstitial tear. 2. Moderate tendinosis of the infraspinatus tendon. 3. Moderate tendinosis of the intra-articular portion of the long head of the biceps tendon. 4. Partial-thickness cartilage loss of the glenohumeral joint. CERVICAL Spine: IMPRESSION: 1. C5-C6 moderate spinal canal stenosis with moderate to severe right and mild-to-moderate left neural foraminal narrowing. 2. C4-C5 mild spinal canal stenosis with moderate right and mild left neural foraminal narrowing. 3. C6-C7 mild spinal canal stenosis with mild-to- moderate left and mild right neural foraminal narrowing.    FOTO: 7/22:  61% HAND DOMINANCE:   Right    TREATMENT:                                                                                                                                -Pt received PROM R shoulder in flexion, scaption, and ER  Supine active flexion 2x10 Sidelying ER 0#  2x10  Sidelying abduction 2x10 Ball roll up wall (pink) 3x5 Standing rows with GTB 2x15  reps Standing shoulder ext with GTB 2x15 Standing cane flexion x10 Standing press out 2x10 (tactile cues to decrease shoulder hike)    PATIENT EDUCATION:  Education details:   Anatomy of condition, POC, HEP, exercise form/rationale.  Person educated: Patient Education method: Explanation, Demonstration, Tactile cues, Verbal cues Education comprehension: verbalized understanding, returned demonstration, verbal cues required, tactile cues required, and needs further education  HOME EXERCISE PROGRAM: [id number] (emailed)   ASSESSMENT:  CLINICAL IMPRESSION: Pt continues to be limited with functional reaching with R UE though reports improvement.  Pt gives good effort with all exercises.  He becomes fatigued with and has some pain with S/L ER.  Pt is improving with rolling ball up wall.  Pt is unable to perform standing cane flexion well being very limited with ROM and having a significant shoulder hike.  He responded well to Rx having no pain though was fatigued after Rx.  OBJECTIVE IMPAIRMENTS: decreased activity tolerance, decreased ROM, decreased strength, increased muscle spasms, impaired sensation, impaired UE functional use, improper body mechanics, postural dysfunction, and pain.   ACTIVITY LIMITATIONS: carrying, lifting, bed mobility, bathing, dressing, reach over head, and hygiene/grooming  PARTICIPATION LIMITATIONS: meal prep, cleaning, driving, and community activity  PERSONAL FACTORS:  see PMH  are also affecting patient's functional outcome.   REHAB POTENTIAL: Good  CLINICAL DECISION MAKING: Evolving/moderate  complexity  EVALUATION COMPLEXITY: Moderate   GOALS: Goals reviewed with patient? Yes  SHORT TERM GOALS: Target date: 7/20  Pull a towel behind back with minimal to no difficulty Baseline: Goal status: PARTIALLY MET  8/7  2.  Increase AROM flexion against gravity by at least 10 deg Baseline:  Goal status: GOAL MET  8/7    LONG TERM GOALS: Target date: 04/20/2023  Lift 10lb to return to [facility] shooting (fall) Baseline:  Goal status: INITIAL  2.  AROM to overhead to brush hair Baseline:  Goal status: PARTIALLY MET  8/7  3.  Meet FOTO goal Baseline:  Goal status: IN PROGRESS 7/22  4.  Straight plane MMT to at least 75% of opp UE Baseline:  Goal status: INITIAL  5.  Return to weight training in gym with proper form Baseline:  Goal status: PROGRESSING    PLAN:  PT FREQUENCY: 1-2x/week  PT DURATION: 5 weeks  PLANNED INTERVENTIONS: Therapeutic exercises, Therapeutic activity, Neuromuscular re-education, Patient/Family education, Self Care, Joint mobilization, Aquatic Therapy, Dry Needling, Electrical stimulation, Spinal mobilization, Cryotherapy, Moist heat, scar mobilization, Taping, Ultrasound, Ionotophoresis 4mg /ml Dexamethasone, Manual therapy, and  Re-evaluation.  PLAN FOR NEXT SESSION:  Cont with shoulder AROM/AAROM/PROM and scapular exercises as appropriate per healing constraints of cervical fusion protocol.   Audie Clear III PT, DPT 03/24/23 11:54 PM

## 2023-03-24 ENCOUNTER — Encounter (HOSPITAL_BASED_OUTPATIENT_CLINIC_OR_DEPARTMENT_OTHER): Payer: Medicare HMO | Admitting: Physical Therapy

## 2023-03-28 ENCOUNTER — Ambulatory Visit (HOSPITAL_BASED_OUTPATIENT_CLINIC_OR_DEPARTMENT_OTHER): Payer: Medicare HMO

## 2023-03-28 ENCOUNTER — Encounter (HOSPITAL_BASED_OUTPATIENT_CLINIC_OR_DEPARTMENT_OTHER): Payer: Self-pay

## 2023-03-28 ENCOUNTER — Telehealth (HOSPITAL_BASED_OUTPATIENT_CLINIC_OR_DEPARTMENT_OTHER): Payer: Self-pay

## 2023-03-28 DIAGNOSIS — M6281 Muscle weakness (generalized): Secondary | ICD-10-CM | POA: Diagnosis not present

## 2023-03-28 DIAGNOSIS — R293 Abnormal posture: Secondary | ICD-10-CM | POA: Diagnosis not present

## 2023-03-28 DIAGNOSIS — M25511 Pain in right shoulder: Secondary | ICD-10-CM | POA: Diagnosis not present

## 2023-03-28 DIAGNOSIS — G8929 Other chronic pain: Secondary | ICD-10-CM | POA: Diagnosis not present

## 2023-03-28 NOTE — Telephone Encounter (Signed)
Attempted to speak with patient and either complete AWV cancelled in May or get him scheduled. No answer. Left vm asking patient to call office to schedule his AWV. Please try to schedule prior to 05-10-23  Abby Kyiesha Millward, CMA  [facility] AWV Team Direct Dial: [phone]

## 2023-03-28 NOTE — Therapy (Signed)
OUTPATIENT PHYSICAL THERAPY TREATMENT      Patient Name: Victor Medina MRN: [id number] DOB:Jan 04, 1946, 77 y.o., male Today's Date: 03/28/2023  END OF SESSION:  PT End of Session - 03/28/23 1519     Visit Number 13    Number of Visits 20    Date for PT Re-Evaluation 04/20/23    Authorization Type Aetna    PT Start Time 1518    PT Stop Time 1556    PT Time Calculation (min) 38 min    Activity Tolerance Patient tolerated treatment well    Behavior During Therapy [facility] for tasks assessed/performed                  PT End of Session - 03/23/23        Visit Number 12    Number of Visits 20     Date for PT Re-Evaluation 04/20/23     Authorization Type Aet[employer]     PT Start Time 1536     PT Stop Time 1611     PT Time Calculation (min) 35 min     Activity Tolerance Patient tolerated treatment well     Behavior During Therapy WFL for tasks assessed/performed         Past Medical History:  Diagnosis Date   Acquired dilation of ascending aorta and aortic root (HCC)    41 mm and 40 mm respectively   Arthritis    Asthma    Cancer (HCC)    mild skin   Chronic diastolic CHF (congestive heart failure) (HCC)    GERD (gastroesophageal reflux disease)    Hypertension    Morbid obesity (HCC)    OSA (obstructive sleep apnea)    PAF (paroxysmal atrial fibrillation) (HCC) 08/14/2015   s/p TEE/DCCV to NSR;  denies palplita    RBBB    atrial fib on cardizem   Past Surgical History:  Procedure Laterality Date   ANTERIOR CERVICAL DECOMP/DISCECTOMY FUSION N/A 01/13/2023   Procedure: CERVICAL 4- CERVICAL 5, CERVICAL 5- CERVICAL 6 ANTERIOR CERVICAL DECOMPRESSION FUSION WITH INSTRUMENTATION AND ALLOGRAFT;  Surgeon: Estill Bamberg, MD;  Location: [facility] OR;  Service: Orthopedics;  Laterality: N/A;   CARDIOVERSION N/A 05/19/2015   Procedure: CARDIOVERSION;  Surgeon: Pricilla Riffle, MD;  Location: [facility] ENDOSCOPY;  Service: Cardiovascular;  Laterality: N/A;   CARDIOVERSION N/A 12/17/2021    Procedure: CARDIOVERSION;  Surgeon: Pricilla Riffle, MD;  Location: [facility] ENDOSCOPY;  Service: Cardiovascular;  Laterality: N/A;   CATARACT EXTRACTION W/ INTRAOCULAR LENS IMPLANT Right    CHOLECYSTECTOMY N/A 12/01/2012   Procedure: LAPAROSCOPIC CHOLECYSTECTOMY;  Surgeon: Cherylynn Ridges, MD;  Location: [facility] OR;  Service: General;  Laterality: N/A;   COLONOSCOPY WITH PROPOFOL N/A 07/13/2019   Procedure: COLONOSCOPY WITH PROPOFOL;  Surgeon: Jeani Hawking, MD;  Location: [facility] ENDOSCOPY;  Service: Endoscopy;  Laterality: N/A;   MASS EXCISION Right 02/20/2018   Procedure: RIGHT THUMB EXCISION MASS AND DEBRIDEMENT DISTAL INTRPHLANGEAL JOINT;  Surgeon: Betha Loa, MD;  Location: [facility];  Service: Orthopedics;  Laterality: Right;   POLYPECTOMY  07/13/2019   Procedure: POLYPECTOMY;  Surgeon: Jeani Hawking, MD;  Location: [facility] ENDOSCOPY;  Service: Endoscopy;;   right knee     meniscus removal 6 years ago   SHOULDER SURGERY Left    Rotator Cuff Repair   TEE WITHOUT CARDIOVERSION N/A 05/19/2015   Procedure: TRANSESOPHAGEAL ECHOCARDIOGRAM (TEE);  Surgeon: Pricilla Riffle, MD;  Location: [facility] ENDOSCOPY;  Service: Cardiovascular;  Laterality: N/A;   Patient Active  Problem List   Diagnosis Date Noted   Cough 07/08/2022   Wellness examination 01/19/2022   Persistent atrial fibrillation (HCC) 11/27/2021   Bilateral knee pain 09/29/2021   Hyperlipidemia 09/20/2018   PAF (paroxysmal atrial fibrillation) (HCC) 08/14/2015   Acquired dilation of ascending aorta and aortic root (HCC) 10/26/2013   Edema of extremities 10/26/2013   HTN (hypertension) 10/26/2013   Chronic diastolic CHF (congestive heart failure) (HCC)    RBBB    Obstructive sleep apnea 08/16/2013   Morbid obesity (HCC) 08/16/2013   Rotator cuff disorder 08/16/2013   Cholecystitis, acute s/p laparoscopic cholecystectomy 12/01/12 12/02/2012     REFERRING PROVIDER:  Huel Cote, MD    REFERRING DIAG:  M75.101 (ICD-10-CM) -  Nontraumatic tear of right rotator cuff, unspecified tear extent    Rationale for Evaluation and Treatment: Rehabilitation  THERAPY DIAG:  Chronic right shoulder pain  Abnormal posture  Muscle weakness (generalized)  ONSET DATE: about 2 years ago   SUBJECTIVE:                                                                                                                                                                                           SUBJECTIVE STATEMENT: 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 "I just wanted to see if I could do it. I didn't want to do too much." Pt denied difficulty or pain with this.     PERTINENT HISTORY:  C4-6 ACDF 01/13/23 A-fib L rotator cuff tear and surgery at least 10 years ago  PAIN: 0/10 R shoulder and cervical pain   PRECAUTIONS:  ACDF 01/13/23  WEIGHT BEARING RESTRICTIONS:  Yes NWB and 10 lb due to cervical surgery  FALLS:  Has patient fallen in last 6 months? Yes. Number of falls 1 Fall on 7/26 per pt. Fell on R shoulder at [employer] PLOF:  Independent  PATIENT GOALS:  Back to gym, back to skeet shooting   OBJECTIVE:    ROM Right eval  Right  8/7  Shoulder flexion 40 active 140 passive  62/135 deg active in standing/supine PROM:  145 deg  Shoulder extension 50 AROM    Shoulder abduction   58   Shoulder adduction             Shoulder internal rotation L1    Shoulder external rotation   78    (Blank rows = not tested)  DIAGNOSTIC FINDINGS:  Rt GHJ MRI 11/29/22: IMPRESSION: 1. Moderate tendinosis of the supraspinatus tendon with a tiny insertional interstitial tear. 2. Moderate tendinosis of the infraspinatus tendon. 3. Moderate tendinosis of the intra-articular portion  of the long head of the biceps tendon. 4. Partial-thickness cartilage loss of the glenohumeral joint. CERVICAL Spine: IMPRESSION: 1. C5-C6 moderate spinal canal stenosis with moderate to severe right and  mild-to-moderate left neural foraminal narrowing. 2. C4-C5 mild spinal canal stenosis with moderate right and mild left neural foraminal narrowing. 3. C6-C7 mild spinal canal stenosis with mild-to- moderate left and mild right neural foraminal narrowing.    FOTO: 7/22: 61% HAND DOMINANCE:  Right    TREATMENT:                                                                                                                                -Pt received PROM R shoulder in flexion, scaption, and ER  Supine active flexion 1# 2x10 Sidelying ER 0#  2x10  Sidelying abduction 1#  2x10 Ball roll up wall (pink) 2x10 Weight shifting at wall x10ea Partial wall push ups x10 Standing cane flexion x10 Standing press out 2x10 (tactile cues to decrease shoulder hike)    PATIENT EDUCATION:  Education details:   Anatomy of condition, POC, HEP, exercise form/rationale.  Person educated: Patient Education method: Explanation, Demonstration, Tactile cues, Verbal cues Education comprehension: verbalized understanding, returned demonstration, verbal cues required, tactile cues required, and needs further education  HOME EXERCISE PROGRAM: [id number] (emailed)   ASSESSMENT:  CLINICAL IMPRESSION: Pt able to progress with active strengthening in supine by adding 1# weight to flexion and with sidelying abduction. He remains challenged by active elevation against gravity and continues to demonstrate shoulder hike, Will continue to progress as tolerated with functional strength.   OBJECTIVE IMPAIRMENTS: decreased activity tolerance, decreased ROM, decreased strength, increased muscle spasms, impaired sensation, impaired UE functional use, improper body mechanics, postural dysfunction, and pain.   ACTIVITY LIMITATIONS: carrying, lifting, bed mobility, bathing, dressing, reach over head, and hygiene/grooming  PARTICIPATION LIMITATIONS: meal prep, cleaning, driving, and community activity  PERSONAL FACTORS:   see PMH  are also affecting patient's functional outcome.   REHAB POTENTIAL: Good  CLINICAL DECISION MAKING: Evolving/moderate complexity  EVALUATION COMPLEXITY: Moderate   GOALS: Goals reviewed with patient? Yes  SHORT TERM GOALS: Target date: 7/20  Pull a towel behind back with minimal to no difficulty Baseline: Goal status: PARTIALLY MET  8/7  2.  Increase AROM flexion against gravity by at least 10 deg Baseline:  Goal status: GOAL MET  8/7    LONG TERM GOALS: Target date: 04/20/2023  Lift 10lb to return to [facility] shooting (fall) Baseline:  Goal status: INITIAL  2.  AROM to overhead to brush hair Baseline:  Goal status: PARTIALLY MET  8/7  3.  Meet FOTO goal Baseline:  Goal status: IN PROGRESS 7/22  4.  Straight plane MMT to at least 75% of opp UE Baseline:  Goal status: INITIAL  5.  Return to weight training in gym with proper form Baseline:  Goal status: PROGRESSING    PLAN:  PT FREQUENCY: 1-2x/week  PT DURATION: 5  weeks  PLANNED INTERVENTIONS: Therapeutic exercises, Therapeutic activity, Neuromuscular re-education, Patient/Family education, Self Care, Joint mobilization, Aquatic Therapy, Dry Needling, Electrical stimulation, Spinal mobilization, Cryotherapy, Moist heat, scar mobilization, Taping, Ultrasound, Ionotophoresis 4mg /ml Dexamethasone, Manual therapy, and Re-evaluation.  PLAN FOR NEXT SESSION:  Cont with shoulder AROM/AAROM/PROM and scapular exercises as appropriate per healing constraints of cervical fusion protocol.   Riki Altes, PTA  03/28/23 4:04 PM

## 2023-03-30 ENCOUNTER — Encounter (HOSPITAL_BASED_OUTPATIENT_CLINIC_OR_DEPARTMENT_OTHER): Payer: Self-pay | Admitting: Physical Therapy

## 2023-03-30 ENCOUNTER — Ambulatory Visit (HOSPITAL_BASED_OUTPATIENT_CLINIC_OR_DEPARTMENT_OTHER): Payer: Medicare HMO | Admitting: Physical Therapy

## 2023-03-30 DIAGNOSIS — G8929 Other chronic pain: Secondary | ICD-10-CM | POA: Diagnosis not present

## 2023-03-30 DIAGNOSIS — M6281 Muscle weakness (generalized): Secondary | ICD-10-CM | POA: Diagnosis not present

## 2023-03-30 DIAGNOSIS — R293 Abnormal posture: Secondary | ICD-10-CM

## 2023-03-30 DIAGNOSIS — M25511 Pain in right shoulder: Secondary | ICD-10-CM | POA: Diagnosis not present

## 2023-03-30 NOTE — Therapy (Signed)
OUTPATIENT PHYSICAL THERAPY TREATMENT      Patient Name: Victor Medina MRN: [id number] DOB:06/11/1946, 77 y.o., male Today's Date: 03/31/2023  END OF SESSION:  PT End of Session - 03/30/23 1535     Visit Number 14    Number of Visits 20    Date for PT Re-Evaluation 04/20/23    Authorization Type Aetna    PT Start Time 1531    PT Stop Time 1606    PT Time Calculation (min) 35 min    Activity Tolerance Patient tolerated treatment well   Pt was fatigued   Behavior During Therapy WFL for tasks assessed/performed                    Past Medical History:  Diagnosis Date   Acquired dilation of ascending aorta and aortic root (HCC)    41 mm and 40 mm respectively   Arthritis    Asthma    Cancer (HCC)    mild skin   Chronic diastolic CHF (congestive heart failure) (HCC)    GERD (gastroesophageal reflux disease)    Hypertension    Morbid obesity (HCC)    OSA (obstructive sleep apnea)    PAF (paroxysmal atrial fibrillation) (HCC) 08/14/2015   s/p TEE/DCCV to NSR;  denies palplita    RBBB    atrial fib on cardizem   Past Surgical History:  Procedure Laterality Date   ANTERIOR CERVICAL DECOMP/DISCECTOMY FUSION N/A 01/13/2023   Procedure: CERVICAL 4- CERVICAL 5, CERVICAL 5- CERVICAL 6 ANTERIOR CERVICAL DECOMPRESSION FUSION WITH INSTRUMENTATION AND ALLOGRAFT;  Surgeon: Estill Bamberg, MD;  Location: [facility] OR;  Service: Orthopedics;  Laterality: N/A;   CARDIOVERSION N/A 05/19/2015   Procedure: CARDIOVERSION;  Surgeon: Pricilla Riffle, MD;  Location: [facility] ENDOSCOPY;  Service: Cardiovascular;  Laterality: N/A;   CARDIOVERSION N/A 12/17/2021   Procedure: CARDIOVERSION;  Surgeon: Pricilla Riffle, MD;  Location: [facility] ENDOSCOPY;  Service: Cardiovascular;  Laterality: N/A;   CATARACT EXTRACTION W/ INTRAOCULAR LENS IMPLANT Right    CHOLECYSTECTOMY N/A 12/01/2012   Procedure: LAPAROSCOPIC CHOLECYSTECTOMY;  Surgeon: Cherylynn Ridges, MD;  Location: [facility] OR;  Service: General;  Laterality: N/A;    COLONOSCOPY WITH PROPOFOL N/A 07/13/2019   Procedure: COLONOSCOPY WITH PROPOFOL;  Surgeon: Jeani Hawking, MD;  Location: [facility] ENDOSCOPY;  Service: Endoscopy;  Laterality: N/A;   MASS EXCISION Right 02/20/2018   Procedure: RIGHT THUMB EXCISION MASS AND DEBRIDEMENT DISTAL INTRPHLANGEAL JOINT;  Surgeon: Betha Loa, MD;  Location: [facility];  Service: Orthopedics;  Laterality: Right;   POLYPECTOMY  07/13/2019   Procedure: POLYPECTOMY;  Surgeon: Jeani Hawking, MD;  Location: [facility] ENDOSCOPY;  Service: Endoscopy;;   right knee     meniscus removal 6 years ago   SHOULDER SURGERY Left    Rotator Cuff Repair   TEE WITHOUT CARDIOVERSION N/A 05/19/2015   Procedure: TRANSESOPHAGEAL ECHOCARDIOGRAM (TEE);  Surgeon: Pricilla Riffle, MD;  Location: [facility] ENDOSCOPY;  Service: Cardiovascular;  Laterality: N/A;   Patient Active Problem List   Diagnosis Date Noted   Cough 07/08/2022   Wellness examination 01/19/2022   Persistent atrial fibrillation (HCC) 11/27/2021   Bilateral knee pain 09/29/2021   Hyperlipidemia 09/20/2018   PAF (paroxysmal atrial fibrillation) (HCC) 08/14/2015   Acquired dilation of ascending aorta and aortic root (HCC) 10/26/2013   Edema of extremities 10/26/2013   HTN (hypertension) 10/26/2013   Chronic diastolic CHF (congestive heart failure) (HCC)    RBBB    Obstructive sleep apnea 08/16/2013   Morbid obesity (  HCC) 08/16/2013   Rotator cuff disorder 08/16/2013   Cholecystitis, acute s/p laparoscopic cholecystectomy 12/01/12 12/02/2012     REFERRING PROVIDER:  Huel Cote, MD    REFERRING DIAG:  M75.101 (ICD-10-CM) - Nontraumatic tear of right rotator cuff, unspecified tear extent    Rationale for Evaluation and Treatment: Rehabilitation  THERAPY DIAG:  Chronic right shoulder pain  Abnormal posture  Muscle weakness (generalized)  ONSET DATE: about 2 years ago   SUBJECTIVE:                                                                                                                                                                                            SUBJECTIVE STATEMENT: Pt denies any adverse effects after prior Rx.  Pt states he just worked out in [employer] and his shoulder didn't like the seated bicep curl machine.  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  Pt just wanted to see if he could do it.      PERTINENT HISTORY:  C4-6 ACDF 01/13/23 A-fib L rotator cuff tear and surgery at least 10 years ago  PAIN: 0/10 R shoulder and cervical pain   PRECAUTIONS:  ACDF 01/13/23  WEIGHT BEARING RESTRICTIONS:  Yes NWB and 10 lb due to cervical surgery  FALLS:  Has patient fallen in last 6 months? Yes. Number of falls 1 Fall on 7/26 per pt. Fell on R shoulder at [employer] PLOF:  Independent  PATIENT GOALS:  Back to gym, back to skeet shooting   OBJECTIVE:    DIAGNOSTIC FINDINGS:  Rt GHJ MRI 11/29/22: IMPRESSION: 1. Moderate tendinosis of the supraspinatus tendon with a tiny insertional interstitial tear. 2. Moderate tendinosis of the infraspinatus tendon. 3. Moderate tendinosis of the intra-articular portion of the long head of the biceps tendon. 4. Partial-thickness cartilage loss of the glenohumeral joint. CERVICAL Spine: IMPRESSION: 1. C5-C6 moderate spinal canal stenosis with moderate to severe right and mild-to-moderate left neural foraminal narrowing. 2. C4-C5 mild spinal canal stenosis with moderate right and mild left neural foraminal narrowing. 3. C6-C7 mild spinal canal stenosis with mild-to- moderate left and mild right neural foraminal narrowing.   HAND DOMINANCE:  Right    TREATMENT:                                                                                                                                -  Pt received PROM R shoulder in flexion, scaption, and ER  Pulleys in flexion and scaption Supine active flexion 1# 2x10 Supine serratus punch 2x10 with cuing  and assistance from PT Supine rhythmic stab's at 90 deg flexion 3x30 sec Sidelying ER 0#  2x10  Ball roll up wall (pink) x10 Standing cane flexion x10 Standing press out 2x10 (tactile cues to decrease shoulder hike)    PATIENT EDUCATION:  Education details:   Anatomy of condition, POC, HEP, exercise form/rationale.  Person educated: Patient Education method: Explanation, Demonstration, Tactile cues, Verbal cues Education comprehension: verbalized understanding, returned demonstration, verbal cues required, tactile cues required, and needs further education  HOME EXERCISE PROGRAM: [id number] (emailed)   ASSESSMENT:  CLINICAL IMPRESSION: He continues to be very limited with UE elevation against gravity though is able to perform with gravity minimized well.  Pt has much difficulty with standing wand flexion including being very limited with ROM and having compensatory shoulder hike.  He was able to perform supine flexion with 1# well.  Pt demonstrates improved strength in ER being able to perform S/L ER with improved form, ease, and tolerance.  Pt was fatigued with treatment today and requested to to be done after 1 set of standing ball rolls up wall.  He denies any pain, just reports fatigue.  OBJECTIVE IMPAIRMENTS: decreased activity tolerance, decreased ROM, decreased strength, increased muscle spasms, impaired sensation, impaired UE functional use, improper body mechanics, postural dysfunction, and pain.   ACTIVITY LIMITATIONS: carrying, lifting, bed mobility, bathing, dressing, reach over head, and hygiene/grooming  PARTICIPATION LIMITATIONS: meal prep, cleaning, driving, and community activity  PERSONAL FACTORS:  see PMH  are also affecting patient's functional outcome.   REHAB POTENTIAL: Good  CLINICAL DECISION MAKING: Evolving/moderate complexity  EVALUATION COMPLEXITY: Moderate   GOALS: Goals reviewed with patient? Yes  SHORT TERM GOALS: Target date: 7/20  Pull a towel  behind back with minimal to no difficulty Baseline: Goal status: PARTIALLY MET  8/7  2.  Increase AROM flexion against gravity by at least 10 deg Baseline:  Goal status: GOAL MET  8/7    LONG TERM GOALS: Target date: 04/20/2023  Lift 10lb to return to [employer] shooting (fall) Baseline:  Goal status: INITIAL  2.  AROM to overhead to brush hair Baseline:  Goal status: PARTIALLY MET  8/7  3.  Meet FOTO goal Baseline:  Goal status: IN PROGRESS 7/22  4.  Straight plane MMT to at least 75% of opp UE Baseline:  Goal status: INITIAL  5.  Return to weight training in gym with proper form Baseline:  Goal status: PROGRESSING    PLAN:  PT FREQUENCY: 1-2x/week  PT DURATION: 5 weeks  PLANNED INTERVENTIONS: Therapeutic exercises, Therapeutic activity, Neuromuscular re-education, Patient/Family education, Self Care, Joint mobilization, Aquatic Therapy, Dry Needling, Electrical stimulation, Spinal mobilization, Cryotherapy, Moist heat, scar mobilization, Taping, Ultrasound, Ionotophoresis 4mg /ml Dexamethasone, Manual therapy, and Re-evaluation.  PLAN FOR NEXT SESSION:  Cont with shoulder AROM/AAROM/PROM and scapular exercises as appropriate per healing constraints of cervical fusion protocol.   Audie Clear III PT, DPT 03/31/23 8:44 PM

## 2023-03-31 ENCOUNTER — Encounter (HOSPITAL_BASED_OUTPATIENT_CLINIC_OR_DEPARTMENT_OTHER): Payer: Medicare HMO | Admitting: Physical Therapy

## 2023-03-31 ENCOUNTER — Ambulatory Visit: Payer: Medicare HMO | Admitting: Orthopaedic Surgery

## 2023-03-31 VITALS — Ht 73.0 in | Wt 315.0 lb

## 2023-03-31 DIAGNOSIS — G8929 Other chronic pain: Secondary | ICD-10-CM

## 2023-03-31 DIAGNOSIS — M1712 Unilateral primary osteoarthritis, left knee: Secondary | ICD-10-CM | POA: Diagnosis not present

## 2023-03-31 DIAGNOSIS — M1711 Unilateral primary osteoarthritis, right knee: Secondary | ICD-10-CM | POA: Diagnosis not present

## 2023-03-31 DIAGNOSIS — M25511 Pain in right shoulder: Secondary | ICD-10-CM

## 2023-03-31 DIAGNOSIS — M25512 Pain in left shoulder: Secondary | ICD-10-CM

## 2023-03-31 NOTE — Progress Notes (Signed)
The patient is a very active 77 year old gentleman with chronic pain in both of his knees.  He is sent to [place] from [place] [facility] to further treat known osteoarthritis of both his knees.  He used to be an avid runner up until about age 42.  He has been on a weight loss journey.  He said he used to weigh a very large amount he is down to 315 pounds.  His BMI today is 41.56.  He is on Eliquis due to chronic A-fib.  He is going to [facility] and that is help with his strength and weight loss journey.  He is interested in considering knee replacement surgery sometime next year.  He says right now Tylenol is helping with his pain.  He has tried and failed other forms conservative treatment including injections multiple times.  Examination of both his knees shows slight varus malalignment.  He does have definitely larger legs but not significant adipose tissue around his knees.  He is just a tall and large individual.  X-rays on the canopy system of both his knees shows tricompartment arthritis with slight varus malalignment and bone-on-bone wear the medial aspect and the patellofemoral joint of both knees.  There are osteophytes in all 3 compartments.  These x-rays were from February 2023.  He understands that we need him to lose a little more weight before proceeding with surgery.  He is still not ready for surgery regardless.  He would also like to continue his weight loss journey and continuing exercises and staying as active as possible.  With that being said we will see him back in 3 months for repeat weight and BMI calculation.  I would like just a standing AP view that she has both knees at that visit.

## 2023-04-04 ENCOUNTER — Encounter (HOSPITAL_BASED_OUTPATIENT_CLINIC_OR_DEPARTMENT_OTHER): Payer: Self-pay | Admitting: Orthopaedic Surgery

## 2023-04-04 DIAGNOSIS — G8929 Other chronic pain: Secondary | ICD-10-CM

## 2023-04-10 ENCOUNTER — Other Ambulatory Visit (HOSPITAL_BASED_OUTPATIENT_CLINIC_OR_DEPARTMENT_OTHER): Payer: Self-pay | Admitting: Family Medicine

## 2023-04-10 DIAGNOSIS — I10 Essential (primary) hypertension: Secondary | ICD-10-CM

## 2023-04-10 DIAGNOSIS — I5032 Chronic diastolic (congestive) heart failure: Secondary | ICD-10-CM

## 2023-04-10 DIAGNOSIS — E7800 Pure hypercholesterolemia, unspecified: Secondary | ICD-10-CM

## 2023-04-10 DIAGNOSIS — R69 Illness, unspecified: Secondary | ICD-10-CM | POA: Diagnosis not present

## 2023-04-26 ENCOUNTER — Encounter (HOSPITAL_BASED_OUTPATIENT_CLINIC_OR_DEPARTMENT_OTHER): Payer: Self-pay

## 2023-05-03 ENCOUNTER — Encounter (HOSPITAL_BASED_OUTPATIENT_CLINIC_OR_DEPARTMENT_OTHER): Payer: Self-pay | Admitting: Family Medicine

## 2023-05-03 ENCOUNTER — Encounter (HOSPITAL_BASED_OUTPATIENT_CLINIC_OR_DEPARTMENT_OTHER): Payer: Medicare HMO

## 2023-05-03 ENCOUNTER — Ambulatory Visit (INDEPENDENT_AMBULATORY_CARE_PROVIDER_SITE_OTHER): Payer: Medicare HMO | Admitting: Family Medicine

## 2023-05-03 ENCOUNTER — Encounter (HOSPITAL_BASED_OUTPATIENT_CLINIC_OR_DEPARTMENT_OTHER): Payer: Self-pay

## 2023-05-03 VITALS — BP 118/86 | HR 78 | Ht 73.0 in | Wt 321.9 lb

## 2023-05-03 DIAGNOSIS — Z860100 Personal history of colon polyps, unspecified: Secondary | ICD-10-CM | POA: Insufficient documentation

## 2023-05-03 DIAGNOSIS — Z23 Encounter for immunization: Secondary | ICD-10-CM | POA: Diagnosis not present

## 2023-05-03 DIAGNOSIS — I480 Paroxysmal atrial fibrillation: Secondary | ICD-10-CM | POA: Diagnosis not present

## 2023-05-03 DIAGNOSIS — I10 Essential (primary) hypertension: Secondary | ICD-10-CM | POA: Diagnosis not present

## 2023-05-03 NOTE — Progress Notes (Signed)
    Procedures performed today:    None.  Independent interpretation of notes and tests performed by another provider:   None.  Brief History, Exam, Impression, and Recommendations:    BP 118/86 (BP Location: Right Arm, Patient Position: Sitting, Cuff Size: Normal)   Pulse 78   Ht 6\' 1"  (1.854 m)   Wt (!) 321 lb 14.4 oz (146 kg)   SpO2 98%   BMI 42.47 kg/m   Primary hypertension Assessment & Plan: Blood pressure appropriate in office today.  No current concerns related chest pain or headaches.  Continues to follow with cardiology.  Has been taking medications as prescribed by cardiology Recommend continue with current medications, no changes to be made today Recommend intermittent monitoring of blood pressure at home, DASH diet  Orders: -     Ambulatory referral to Cardiology  PAF (paroxysmal atrial fibrillation) [facility]) Assessment & Plan: Patient continues with Eliquis. He does continue with diltiazem, Multaq, metoprolol.  No recent issues with dizziness, lightheadedness, palpitations. Patient does report concerns related to Eliquis as he has had easy bruising, mostly over hands and forearms.  He wonders about alternative options related to the Eliquis given the degree of bruising and skin changes that are noted.  Denies any significant bleeding issues On exam today, patient in no acute distress, vital signs stable.  Cardiovascular exam with regular rate and rhythm. Patient appears to be in normal sinus rhythm in office today.  Recommend continuing with current medications. Recommend scheduling appointment with Cardiology in the near future to further discuss Eliquis concerns.  Orders: -     Ambulatory referral to Cardiology  Encounter for immunization -     Flu Vaccine Trivalent High Dose (Fluad)  Return in about 4 months (around 09/02/2023).   ___________________________________________ Daveigh Batty de [place], MD, ABFM, CAQSM Primary Care and Sports Medicine [facility]

## 2023-05-03 NOTE — Assessment & Plan Note (Addendum)
Patient continues with Eliquis. He does continue with diltiazem, Multaq, metoprolol.  No recent issues with dizziness, lightheadedness, palpitations. Patient does report concerns related to Eliquis as he has had easy bruising, mostly over hands and forearms.  He wonders about alternative options related to the Eliquis given the degree of bruising and skin changes that are noted.  Denies any significant bleeding issues On exam today, patient in no acute distress, vital signs stable.  Cardiovascular exam with regular rate and rhythm. Patient appears to be in normal sinus rhythm in office today.  Recommend continuing with current medications. Recommend scheduling appointment with Cardiology in the near future to further discuss Eliquis concerns.

## 2023-05-03 NOTE — Assessment & Plan Note (Signed)
Blood pressure appropriate in office today.  No current concerns related chest pain or headaches.  Continues to follow with cardiology.  Has been taking medications as prescribed by cardiology Recommend continue with current medications, no changes to be made today Recommend intermittent monitoring of blood pressure at home, DASH diet

## 2023-05-04 ENCOUNTER — Ambulatory Visit (HOSPITAL_BASED_OUTPATIENT_CLINIC_OR_DEPARTMENT_OTHER): Payer: Medicare HMO | Admitting: Family Medicine

## 2023-05-10 DIAGNOSIS — R69 Illness, unspecified: Secondary | ICD-10-CM | POA: Diagnosis not present

## 2023-05-13 ENCOUNTER — Encounter: Payer: Self-pay | Admitting: Orthopaedic Surgery

## 2023-05-13 ENCOUNTER — Telehealth: Payer: Self-pay

## 2023-05-13 NOTE — Telephone Encounter (Signed)
Bilateral gel injections  

## 2023-05-16 NOTE — Telephone Encounter (Signed)
VOB submitted for Durolane, bilateral knee  

## 2023-05-23 ENCOUNTER — Other Ambulatory Visit: Payer: Self-pay

## 2023-05-23 DIAGNOSIS — M170 Bilateral primary osteoarthritis of knee: Secondary | ICD-10-CM

## 2023-05-23 NOTE — Telephone Encounter (Signed)
Talked with patient and appointment has been scheduled.

## 2023-05-26 ENCOUNTER — Ambulatory Visit (INDEPENDENT_AMBULATORY_CARE_PROVIDER_SITE_OTHER): Payer: Medicare HMO | Admitting: Family Medicine

## 2023-05-26 ENCOUNTER — Encounter (HOSPITAL_BASED_OUTPATIENT_CLINIC_OR_DEPARTMENT_OTHER): Payer: Self-pay | Admitting: Family Medicine

## 2023-05-26 VITALS — BP 132/78 | HR 72 | Ht 73.0 in | Wt 319.9 lb

## 2023-05-26 DIAGNOSIS — L989 Disorder of the skin and subcutaneous tissue, unspecified: Secondary | ICD-10-CM | POA: Diagnosis not present

## 2023-05-26 NOTE — Progress Notes (Signed)
    Procedures performed today:    None.  Independent interpretation of notes and tests performed by another provider:   None.  Brief History, Exam, Impression, and Recommendations:    BP 132/78 (BP Location: Right Arm, Patient Position: Sitting, Cuff Size: Normal)   Pulse 72   Ht 6\' 1"  (1.854 m)   Wt (!) 319 lb 14.4 oz (145.1 kg)   SpO2 97%   BMI 42.21 kg/m   Skin lesion of face Assessment & Plan: Patient presents for evaluation of lesion on right cheek.  He reports that lesion has been present for at least 6 months or longer.  He primarily presents today at the insistence of his wife.  He has not had any associated pain, bleeding, itching.  Not necessarily aware of changes to size of lesion. On exam, papule noted over right cheek and skin overlying TMJ.  Area is raised with rough surface, dry appearance.  There is no surrounding erythema, no tenderness to palpation.  No current oozing or drainage. Area does have some concern for possible malignancy such as basal cell carcinoma.  Given this, recommend further evaluation with dermatology given appearance, size, location.  Referral to dermatology placed today.  Advised that if patient does not hear from their office within 1 to 2 weeks, to contact us and we can assist with referral process.  Orders: -     Ambulatory referral to Dermatology  Return if symptoms worsen or fail to improve.   ___________________________________________ Chirstina Haan de [place], MD, ABFM, CAQSM Primary Care and Sports Medicine [facility]

## 2023-05-26 NOTE — Assessment & Plan Note (Signed)
Patient presents for evaluation of lesion on right cheek.  He reports that lesion has been present for at least 6 months or longer.  He primarily presents today at the insistence of his wife.  He has not had any associated pain, bleeding, itching.  Not necessarily aware of changes to size of lesion. On exam, papule noted over right cheek and skin overlying TMJ.  Area is raised with rough surface, dry appearance.  There is no surrounding erythema, no tenderness to palpation.  No current oozing or drainage. Area does have some concern for possible malignancy such as basal cell carcinoma.  Given this, recommend further evaluation with dermatology given appearance, size, location.  Referral to dermatology placed today.  Advised that if patient does not hear from their office within 1 to 2 weeks, to contact us and we can assist with referral process.

## 2023-05-30 ENCOUNTER — Ambulatory Visit: Payer: Medicare HMO | Admitting: Physician Assistant

## 2023-05-30 DIAGNOSIS — M1711 Unilateral primary osteoarthritis, right knee: Secondary | ICD-10-CM

## 2023-05-30 DIAGNOSIS — M1712 Unilateral primary osteoarthritis, left knee: Secondary | ICD-10-CM

## 2023-05-30 DIAGNOSIS — M170 Bilateral primary osteoarthritis of knee: Secondary | ICD-10-CM | POA: Diagnosis not present

## 2023-05-30 MED ORDER — SODIUM HYALURONATE 60 MG/3ML IX PRSY
60.0000 mg | PREFILLED_SYRINGE | INTRA_ARTICULAR | Status: AC | PRN
  Administered 2023-05-30: 60 mg via INTRA_ARTICULAR

## 2023-05-30 NOTE — Progress Notes (Signed)
Procedure Note  Patient: Victor Medina             Date of Birth: 03-24-1946           MRN: [id number]             Visit Date: 05/30/2023 HPI: [name] comes in today for scheduled Durolane injections both knees.  He states his knee pain is getting worse.  He has had cortisone injections in both knees.  He is unable to take NSAIDs due to the fact that he is on chronic anticoagulation.  He is working on weight loss.  He has no scheduled surgery on either knee in the next 6 months.  Physical exam: Bilateral knees no abnormal warmth erythema or effusion. Good range of motion of both knees.  Procedures: Visit Diagnoses:  1. Unilateral primary osteoarthritis, left knee   2. Unilateral primary osteoarthritis, right knee     Large Joint Inj: bilateral knee on 05/30/2023 3:14 PM Indications: pain Details: 22 G 1.5 in needle, anterolateral approach  Arthrogram: No  Medications (Right): 60 mg Sodium Hyaluronate 60 MG/3ML Medications (Left): 60 mg Sodium Hyaluronate 60 MG/3ML Outcome: tolerated well, no immediate complications Procedure, treatment alternatives, risks and benefits explained, specific risks discussed. Consent was given by the patient. Immediately prior to procedure a time out was called to verify the correct patient, procedure, equipment, support staff and site/side marked as required. Patient was prepped and draped in the usual sterile fashion.    Plan: He will follow-up with [place] as needed understands to wait at least 6 months between viscosupplementation injections.  He tolerated the injections well today.

## 2023-05-31 ENCOUNTER — Telehealth (HOSPITAL_BASED_OUTPATIENT_CLINIC_OR_DEPARTMENT_OTHER): Payer: Medicare HMO

## 2023-05-31 ENCOUNTER — Encounter (HOSPITAL_BASED_OUTPATIENT_CLINIC_OR_DEPARTMENT_OTHER): Payer: Self-pay | Admitting: *Deleted

## 2023-06-06 ENCOUNTER — Ambulatory Visit: Payer: Medicare HMO | Admitting: Physician Assistant

## 2023-06-08 ENCOUNTER — Encounter (HOSPITAL_BASED_OUTPATIENT_CLINIC_OR_DEPARTMENT_OTHER): Payer: Self-pay

## 2023-06-09 ENCOUNTER — Encounter (HOSPITAL_BASED_OUTPATIENT_CLINIC_OR_DEPARTMENT_OTHER): Payer: Self-pay | Admitting: Family Medicine

## 2023-06-10 DIAGNOSIS — R69 Illness, unspecified: Secondary | ICD-10-CM | POA: Diagnosis not present

## 2023-07-04 ENCOUNTER — Encounter (HOSPITAL_COMMUNITY): Payer: Self-pay

## 2023-07-04 ENCOUNTER — Encounter: Payer: Self-pay | Admitting: Cardiology

## 2023-07-06 ENCOUNTER — Ambulatory Visit: Payer: Medicare HMO | Admitting: Orthopaedic Surgery

## 2023-07-06 ENCOUNTER — Encounter: Payer: Self-pay | Admitting: Orthopaedic Surgery

## 2023-07-06 VITALS — Ht 73.0 in | Wt 330.4 lb

## 2023-07-06 DIAGNOSIS — M1712 Unilateral primary osteoarthritis, left knee: Secondary | ICD-10-CM | POA: Diagnosis not present

## 2023-07-06 DIAGNOSIS — M1711 Unilateral primary osteoarthritis, right knee: Secondary | ICD-10-CM

## 2023-07-06 NOTE — Progress Notes (Signed)
The patient is a 77 year old gentleman who is following up after having bilateral knee hyaluronic acid injections with Durolane.  He says those have not helped him at all.  His x-rays of his knees previously do show tricompartment arthritis with bone-on-bone wear mainly at the medial compartment of both knees.  He is on Eliquis for chronic A-fib.  He sees Dr. Mayford Knife from cardiology.  He is wanting to discuss knee replacement surgeries.  His BMI today is 43.59.  Both knees were assessed and he does have heavy legs but he is a tall and heavy individual at 330 pounds.  Both knees have varus malalignment and medial joint line tenderness with also patellofemoral crepitation throughout the arc of motion of both knees.  We talked in length in detail about knee replacement surgery.  I would like him to try to lose some more weight so we can show that he is on a weight loss journey given the higher complication rate of total joint arthroplasty surgeries in patients over 40 BMI.  I would like to see him back in 6 weeks from now with a repeat weight and BMI calculation.  He will also talk to cardiology about clearance from their standpoint.  From a standpoint of anesthesia, they would want him off of Eliquis for 3 full complete days prior to providing any spinal anesthesia.  At his next visit we will again repeat his weight and BMI calculation and go from there.

## 2023-07-10 ENCOUNTER — Other Ambulatory Visit (HOSPITAL_BASED_OUTPATIENT_CLINIC_OR_DEPARTMENT_OTHER): Payer: Self-pay | Admitting: Family Medicine

## 2023-07-18 ENCOUNTER — Other Ambulatory Visit (HOSPITAL_BASED_OUTPATIENT_CLINIC_OR_DEPARTMENT_OTHER): Payer: Self-pay | Admitting: Family Medicine

## 2023-07-18 DIAGNOSIS — I5032 Chronic diastolic (congestive) heart failure: Secondary | ICD-10-CM

## 2023-07-18 DIAGNOSIS — I10 Essential (primary) hypertension: Secondary | ICD-10-CM

## 2023-07-18 DIAGNOSIS — E7800 Pure hypercholesterolemia, unspecified: Secondary | ICD-10-CM

## 2023-07-21 ENCOUNTER — Encounter: Payer: Self-pay | Admitting: Dermatology

## 2023-07-21 ENCOUNTER — Ambulatory Visit: Payer: Medicare HMO | Admitting: Dermatology

## 2023-07-21 DIAGNOSIS — D04122 Carcinoma in situ of skin of left lower eyelid, including canthus: Secondary | ICD-10-CM

## 2023-07-21 DIAGNOSIS — D492 Neoplasm of unspecified behavior of bone, soft tissue, and skin: Secondary | ICD-10-CM

## 2023-07-21 DIAGNOSIS — D485 Neoplasm of uncertain behavior of skin: Secondary | ICD-10-CM

## 2023-07-21 DIAGNOSIS — C44329 Squamous cell carcinoma of skin of other parts of face: Secondary | ICD-10-CM

## 2023-07-21 NOTE — Patient Instructions (Signed)

## 2023-07-21 NOTE — Progress Notes (Signed)
   New Patient Visit   Subjective  Victor Medina is a 77 y.o. male who presents for the following: Spots of concern  The patient has spots, moles and lesions to be evaluated, some may be new or changing and the patient may have concern these could be cancer.  Lesion on right cheek, present for several months, itching, bleeding, growing, not previously treated  Lesion on left lower eyelid, crusty and present for years, not previously treated.  The following portions of the chart were reviewed this encounter and updated as appropriate: medications, allergies, medical history  Review of Systems:  No other skin or systemic complaints except as noted in HPI or Assessment and Plan.  Objective  Well appearing patient in no apparent distress; mood and affect are within normal limits.  A focused examination was performed of the following areas: Face  Relevant physical exam findings are noted in the Assessment and Plan.  Right cheek 1.12 x 0.8 cm pink papule with hemorrhagic crusting  Left lower eyelid 1.4 cm hyperkeratotic plaque   Assessment & Plan   NEOPLASM OF UNCERTAIN BEHAVIOR OF SKIN (2) Right cheek Skin / nail biopsy Type of biopsy: tangential   Informed consent: discussed and consent obtained   Timeout: patient name, date of birth, surgical site, and procedure verified   Procedure prep:  Patient was prepped and draped in usual sterile fashion Prep type:  Isopropyl alcohol Anesthesia: the lesion was anesthetized in a standard fashion   Anesthetic:  1% lidocaine w/ epinephrine 1-100,000 buffered w/ 8.4% NaHCO3 Instrument used: flexible razor blade   Hemostasis achieved with: pressure, aluminum chloride and electrodesiccation   Outcome: patient tolerated procedure well   Post-procedure details: sterile dressing applied and wound care instructions given   Dressing type: bandage and petrolatum   Left lower eyelid Skin / nail biopsy Type of biopsy: tangential   Informed  consent: discussed and consent obtained   Timeout: patient name, date of birth, surgical site, and procedure verified   Procedure prep:  Patient was prepped and draped in usual sterile fashion Prep type:  Isopropyl alcohol Anesthesia: the lesion was anesthetized in a standard fashion   Anesthetic:  1% lidocaine w/ epinephrine 1-100,000 buffered w/ 8.4% NaHCO3 Instrument used: flexible razor blade   Hemostasis achieved with: pressure, aluminum chloride and electrodesiccation   Outcome: patient tolerated procedure well   Post-procedure details: sterile dressing applied and wound care instructions given   Dressing type: bandage and petrolatum     Return pending biopsy results.    Documentation: I have reviewed the above documentation for accuracy and completeness, and I agree with the above.  Gwenith Daily, MD

## 2023-07-26 LAB — SURGICAL PATHOLOGY

## 2023-07-28 ENCOUNTER — Telehealth: Payer: Self-pay | Admitting: Dermatology

## 2023-07-28 NOTE — Telephone Encounter (Signed)
I attempted to call pt to go over results and treatment options. I LVM to return our call.

## 2023-07-28 NOTE — Telephone Encounter (Signed)
-----   Message from [facility] PACI sent at 07/27/2023  3:24 PM EST ----- 1. SCC- right cheek- Mohs with me 2. 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

## 2023-07-31 ENCOUNTER — Other Ambulatory Visit: Payer: Self-pay | Admitting: Cardiology

## 2023-07-31 DIAGNOSIS — I5032 Chronic diastolic (congestive) heart failure: Secondary | ICD-10-CM

## 2023-08-17 ENCOUNTER — Ambulatory Visit: Payer: Medicare HMO | Admitting: Orthopaedic Surgery

## 2023-09-05 ENCOUNTER — Encounter: Payer: Self-pay | Admitting: Dermatology

## 2023-09-05 ENCOUNTER — Encounter (HOSPITAL_BASED_OUTPATIENT_CLINIC_OR_DEPARTMENT_OTHER): Payer: Self-pay | Admitting: Family Medicine

## 2023-09-05 ENCOUNTER — Ambulatory Visit (INDEPENDENT_AMBULATORY_CARE_PROVIDER_SITE_OTHER): Payer: Medicare HMO | Admitting: Family Medicine

## 2023-09-05 VITALS — BP 126/72 | HR 79 | Temp 97.7°F | Ht 73.0 in | Wt 330.9 lb

## 2023-09-05 DIAGNOSIS — I480 Paroxysmal atrial fibrillation: Secondary | ICD-10-CM | POA: Diagnosis not present

## 2023-09-05 DIAGNOSIS — L989 Disorder of the skin and subcutaneous tissue, unspecified: Secondary | ICD-10-CM

## 2023-09-05 NOTE — Progress Notes (Signed)
    Procedures performed today:    None.  Independent interpretation of notes and tests performed by another provider:   None.  Brief History, Exam, Impression, and Recommendations:    BP 126/72 (BP Location: Right Arm, Patient Position: Sitting, Cuff Size: Large)   Pulse 79   Temp 97.7 F (36.5 C) (Oral)   Ht 6\' 1"  (1.854 m)   Wt (!) 330 lb 14.4 oz (150.1 kg)   SpO2 99%   BMI 43.66 kg/m   PAF (paroxysmal atrial fibrillation) (HCC) Assessment & Plan: Patient continues with Eliquis. He does continue with diltiazem, Multaq, metoprolol.  No recent issues with dizziness, lightheadedness, palpitations. Patient has concerns related to Eliquis as he has had easy bruising, mostly over hands and forearms.  He wonders about alternative options related to the Eliquis given the degree of bruising and skin changes that are noted.  Denies any significant bleeding issues On exam today, patient in no acute distress, vital signs stable.  Cardiovascular exam with normal rate and irregular rhythm. Patient in atrial fibrillation in office today.  Recommend continuing with current medications. Continue with upcoming appointment with Cardiology in the near future   Skin lesion of face Assessment & Plan: Had evaluation with derm, has follow-up appointment upcoming. Encouraged to continue with this. Next appt evaluating area under left eye.   Working to lose weight for possible knee replacement in the future.  Did recently have a number of weeks out of town visiting family due to recent death in the family.  This led to changes in regards to dietary intake and exercise regimen and he correspondingly did have increasing weight.  He is now working to get back into his usual routine and work towards gradual weight loss  Return in about 4 months (around 01/03/2024).   ___________________________________________ Buddie Marston de [place], MD, ABFM, CAQSM Primary Care and Sports Medicine [facility]

## 2023-09-05 NOTE — Assessment & Plan Note (Signed)
Had evaluation with derm, has follow-up appointment upcoming. Encouraged to continue with this. Next appt evaluating area under left eye.

## 2023-09-05 NOTE — Assessment & Plan Note (Signed)
Patient continues with Eliquis. He does continue with diltiazem, Multaq, metoprolol.  No recent issues with dizziness, lightheadedness, palpitations. Patient has concerns related to Eliquis as he has had easy bruising, mostly over hands and forearms.  He wonders about alternative options related to the Eliquis given the degree of bruising and skin changes that are noted.  Denies any significant bleeding issues On exam today, patient in no acute distress, vital signs stable.  Cardiovascular exam with normal rate and irregular rhythm. Patient in atrial fibrillation in office today.  Recommend continuing with current medications. Continue with upcoming appointment with Cardiology in the near future

## 2023-09-05 NOTE — Patient Instructions (Signed)

## 2023-09-06 ENCOUNTER — Encounter: Payer: Medicare HMO | Admitting: Dermatology

## 2023-09-08 ENCOUNTER — Ambulatory Visit: Payer: Medicare HMO | Admitting: Orthopaedic Surgery

## 2023-09-08 ENCOUNTER — Encounter: Payer: Self-pay | Admitting: Orthopaedic Surgery

## 2023-09-08 VITALS — Ht 72.64 in | Wt 330.8 lb

## 2023-09-08 DIAGNOSIS — M1711 Unilateral primary osteoarthritis, right knee: Secondary | ICD-10-CM | POA: Diagnosis not present

## 2023-09-08 DIAGNOSIS — M1712 Unilateral primary osteoarthritis, left knee: Secondary | ICD-10-CM

## 2023-09-08 DIAGNOSIS — M25562 Pain in left knee: Secondary | ICD-10-CM | POA: Diagnosis not present

## 2023-09-08 DIAGNOSIS — G8929 Other chronic pain: Secondary | ICD-10-CM

## 2023-09-08 DIAGNOSIS — M25561 Pain in right knee: Secondary | ICD-10-CM | POA: Diagnosis not present

## 2023-09-08 DIAGNOSIS — Z6841 Body Mass Index (BMI) 40.0 and over, adult: Secondary | ICD-10-CM | POA: Diagnosis not present

## 2023-09-08 NOTE — Progress Notes (Signed)
The patient is a 78 year old gentleman with debilitating arthritis involving both his knees but is much worse on the right than the left.  It is gotten to the point where injections no longer help.  He is in need of knee replacement surgery and his x-rays and clinical exam confirmed that.  However his BMI is significantly high and his insurance does not allow for knee replacement surgery till someone's BMI is under 40.  That has become the community standard within orthopedics as well.  He has had a tough time over the holidays with several deaths and other things that have kept him from being able to exercise and eat right.  His BMI today is 44.08.  His right knee does have varus malalignment and pain throughout the arc of motion of the knee and significant patellofemoral crepitation.  He knows that we cannot proceed any further with scheduling surgery until his BMI is lower.  We can see him back in 3 months with a repeat weight and BMI calculation.

## 2023-09-20 DIAGNOSIS — M4692 Unspecified inflammatory spondylopathy, cervical region: Secondary | ICD-10-CM | POA: Diagnosis not present

## 2023-09-21 ENCOUNTER — Encounter (HOSPITAL_BASED_OUTPATIENT_CLINIC_OR_DEPARTMENT_OTHER): Payer: Self-pay | Admitting: *Deleted

## 2023-09-21 ENCOUNTER — Ambulatory Visit (HOSPITAL_BASED_OUTPATIENT_CLINIC_OR_DEPARTMENT_OTHER): Payer: Medicare HMO | Admitting: *Deleted

## 2023-09-21 VITALS — Ht 73.0 in | Wt 324.0 lb

## 2023-09-21 DIAGNOSIS — Z0000 Encounter for general adult medical examination without abnormal findings: Secondary | ICD-10-CM | POA: Diagnosis not present

## 2023-09-21 NOTE — Patient Instructions (Signed)
[name] , Thank you for taking time to come for your Medicare Wellness Visit. I appreciate your ongoing commitment to your health goals. Please review the following plan we discussed and let me know if I can assist you in the future.   Referrals/Orders/Follow-Ups/Clinician Recommendations: no referrals placed today  This is a list of the screening recommended for you and due dates:  Health Maintenance  Topic Date Due   COVID-19 Vaccine (6 - 2024-25 season) 10/07/2023*   DTaP/Tdap/Td vaccine (1 - Tdap) 09/20/2024*   Hepatitis C Screening  09/20/2024*   Zoster (Shingles) Vaccine (1 of 2) 09/20/2024*   Medicare Annual Wellness Visit  09/20/2024   Pneumonia Vaccine  Completed   Flu Shot  Completed   HPV Vaccine  Aged Out   Colon Cancer Screening  Discontinued  *Topic was postponed. The date shown is not the original due date.    Advanced directives: (ACP Link)Information on Advanced Care Planning can be found at [facility] [place] Advance Health Care Directives Advance Health Care Directives ([email or web address]   Next Medicare Annual Wellness Visit scheduled for next year: No

## 2023-09-21 NOTE — Progress Notes (Signed)
Subjective:   Victor Medina is a 78 y.o. male who presents for Medicare Annual/Subsequent preventive examination.  Visit Complete: Virtual I connected with  Gertrude Tarbet Heffelfinger on 09/21/23 by a audio enabled telemedicine application and verified that I am speaking with the correct person using two identifiers.  Patient Location: Home  Provider Location: Office/Clinic  I discussed the limitations of evaluation and management by telemedicine. The patient expressed understanding and agreed to proceed.  Vital Signs: Because this visit was a virtual/telehealth visit, some criteria may be missing or patient reported. Any vitals not documented were not able to be obtained and vitals that have been documented are patient reported.  Patient Medicare AWV questionnaire was completed by the patient on 09/21/23; I have confirmed that all information answered by patient is correct and no changes since this date.  Cardiac Risk Factors include: advanced age (>59men, >71 women);obesity (BMI >30kg/m2);male gender;hypertension;sedentary lifestyle     Objective:    Today's Vitals   09/21/23 0946 09/21/23 0947  Weight: (!) 324 lb (147 kg)   Height: 6\' 1"  (1.854 m)   PainSc:  8    Body mass index is 42.75 kg/m.     09/21/2023    9:51 AM 02/01/2023   12:36 PM 01/20/2023   10:13 AM 01/16/2023    5:55 AM 01/15/2023    4:13 PM 01/04/2023    1:27 PM 04/25/2022    9:13 AM  Advanced Directives  Does Patient Have a Medical Advance Directive? No No No No No No No  Would patient like information on creating a medical advance directive? Yes (MAU/Ambulatory/Procedural Areas - Information given) No - Patient declined No - Patient declined No - Patient declined  No - Patient declined     Current Medications (verified) Outpatient Encounter Medications as of 09/21/2023  Medication Sig   acetaminophen (TYLENOL) 500 MG tablet Take 1,000 mg by mouth every 6 (six) hours as needed for moderate pain.   apixaban  (ELIQUIS) 5 MG TABS tablet Take 5 mg by mouth 2 (two) times daily.   APPLE CIDER VINEGAR PO Take 2 capsules by mouth daily.   Cholecalciferol (VITAMIN D3) 2000 UNITS TABS Take 4,000 Units by mouth daily.    diltiazem (CARDIZEM CD) 120 MG 24 hr capsule TAKE 1 CAPSULE BY MOUTH 2 TIMES DAILY.   dronedarone (MULTAQ) 400 MG tablet Take 1 tablet (400 mg total) by mouth 2 (two) times daily with a meal.   fluticasone (FLONASE) 50 MCG/ACT nasal spray Place 1 spray into both nostrils at bedtime.   furosemide (LASIX) 20 MG tablet Take 1 tablet (20 mg total) by mouth daily. Take 2 tabs daily for 3 days, then reduce dose to one tablet daily.   hydrochlorothiazide (HYDRODIURIL) 25 MG tablet Take 1 tablet (25 mg total) by mouth daily.   loratadine (CLARITIN) 10 MG tablet Take 10 mg by mouth daily.   Magnesium 250 MG TABS Take 250 mg by mouth 2 (two) times daily.   methocarbamol (ROBAXIN) 500 MG tablet Take 500 mg by mouth as needed for muscle spasms.   metoprolol tartrate (LOPRESSOR) 25 MG tablet TAKE 1 AND 1/2 TABLETS BY MOUTH TWICE DAILY   Multiple Vitamins-Minerals (MULTIVITAMIN ADULT) CHEW Chew 2 each by mouth daily.   tamsulosin (FLOMAX) 0.4 MG CAPS capsule TAKE 1 CAPSULE BY MOUTH EVERYDAY AT BEDTIME   trolamine salicylate (ASPERCREME) 10 % cream Apply 1 application topically as needed for muscle pain.   WIXELA INHUB 100-50 MCG/ACT AEPB INHALE 1 PUFF  INTO THE LUNGS TWICE A DAY   No facility-administered encounter medications on file as of 09/21/2023.    Allergies (verified) Patient has no known allergies.   History: Past Medical History:  Diagnosis Date   Acquired dilation of ascending aorta and aortic root (HCC)    41 mm and 40 mm respectively   Arthritis    Asthma    Cancer (HCC)    mild skin   Chronic diastolic CHF (congestive heart failure) (HCC)    GERD (gastroesophageal reflux disease)    Hypertension    Morbid obesity (HCC)    OSA (obstructive sleep apnea)    PAF (paroxysmal atrial  fibrillation) (HCC) 08/14/2015   s/p TEE/DCCV to NSR;  denies palplita    RBBB    atrial fib on cardizem   Past Surgical History:  Procedure Laterality Date   ANTERIOR CERVICAL DECOMP/DISCECTOMY FUSION N/A 01/13/2023   Procedure: CERVICAL 4- CERVICAL 5, CERVICAL 5- CERVICAL 6 ANTERIOR CERVICAL DECOMPRESSION FUSION WITH INSTRUMENTATION AND ALLOGRAFT;  Surgeon: Estill Bamberg, MD;  Location: [facility] OR;  Service: Orthopedics;  Laterality: N/A;   CARDIOVERSION N/A 05/19/2015   Procedure: CARDIOVERSION;  Surgeon: Pricilla Riffle, MD;  Location: [facility] ENDOSCOPY;  Service: Cardiovascular;  Laterality: N/A;   CARDIOVERSION N/A 12/17/2021   Procedure: CARDIOVERSION;  Surgeon: Pricilla Riffle, MD;  Location: [facility] ENDOSCOPY;  Service: Cardiovascular;  Laterality: N/A;   CATARACT EXTRACTION W/ INTRAOCULAR LENS IMPLANT Right    CHOLECYSTECTOMY N/A 12/01/2012   Procedure: LAPAROSCOPIC CHOLECYSTECTOMY;  Surgeon: Cherylynn Ridges, MD;  Location: [facility] OR;  Service: General;  Laterality: N/A;   COLONOSCOPY WITH PROPOFOL N/A 07/13/2019   Procedure: COLONOSCOPY WITH PROPOFOL;  Surgeon: Jeani Hawking, MD;  Location: [facility] ENDOSCOPY;  Service: Endoscopy;  Laterality: N/A;   MASS EXCISION Right 02/20/2018   Procedure: RIGHT THUMB EXCISION MASS AND DEBRIDEMENT DISTAL INTRPHLANGEAL JOINT;  Surgeon: Betha Loa, MD;  Location: [facility];  Service: Orthopedics;  Laterality: Right;   POLYPECTOMY  07/13/2019   Procedure: POLYPECTOMY;  Surgeon: Jeani Hawking, MD;  Location: [facility] ENDOSCOPY;  Service: Endoscopy;;   right knee     meniscus removal 6 years ago   SHOULDER SURGERY Left    Rotator Cuff Repair   TEE WITHOUT CARDIOVERSION N/A 05/19/2015   Procedure: TRANSESOPHAGEAL ECHOCARDIOGRAM (TEE);  Surgeon: Pricilla Riffle, MD;  Location: [facility] ENDOSCOPY;  Service: Cardiovascular;  Laterality: N/A;   Family History  Problem Relation Age of Onset   Hypotension Mother    Heart disease Sister    Social History   Socioeconomic  History   Marital status: Married    Spouse name: Not on file   Number of children: Not on file   Years of education: Not on file   Highest education level: Associate degree: occupational, Scientist, product/process development, or vocational program  Occupational History   Not on file  Tobacco Use   Smoking status: Former    Types: Cigarettes   Smokeless tobacco: Never   Tobacco comments:    Quit 45 years ago as of 2024  Vaping Use   Vaping status: Never Used  Substance and Sexual Activity   Alcohol use: Not Currently   Drug use: No   Sexual activity: Never  Other Topics Concern   Not on file  Social History Narrative   Not on file   Social Drivers of Health   Financial Resource Strain: Low Risk  (09/01/2023)   Overall Financial Resource Strain (CARDIA)    Difficulty of Paying Living Expenses: Not  hard at all  Food Insecurity: No Food Insecurity (09/01/2023)   Hunger Vital Sign    Worried About Running Out of Food in the Last Year: Never true    Ran Out of Food in the Last Year: Never true  Transportation Needs: No Transportation Needs (09/01/2023)   PRAPARE - Administrator, Civil Service (Medical): No    Lack of Transportation (Non-Medical): No  Physical Activity: Sufficiently Active (09/01/2023)   Exercise Vital Sign    Days of Exercise per Week: 3 days    Minutes of Exercise per Session: 60 min  Stress: No Stress Concern Present (09/01/2023)   [employer] of Occupational Health - Occupational Stress Questionnaire    Feeling of Stress : Not at all  Social Connections: Unknown (09/21/2023)   Social Connection and Isolation Panel [NHANES]    Frequency of Communication with Friends and Family: Patient declined    Frequency of Social Gatherings with Friends and Family: Patient declined    Attends Religious Services: Never    Database administrator or Organizations: No    Attends Engineer, structural: Never    Marital Status: Married    Tobacco Counseling Counseling  given: Not Answered Tobacco comments: Quit 45 years ago as of 2024   Clinical Intake:  Pre-visit preparation completed: Yes  Pain : 0-10 Pain Score: 8  (pain when he gets up to move around) Pain Type: Chronic pain Pain Location: Knee Pain Orientation: Left, Right Pain Radiating Towards: does not radiate anywhere Pain Descriptors / Indicators: Constant, Aching Pain Onset: More than a month ago Pain Frequency: Constant Pain Relieving Factors: tylenol usually helps Effect of Pain on Daily Activities: makes going up stairs more difficult  Pain Relieving Factors: tylenol usually helps  BMI - recorded: 42.75 Nutritional Status: BMI > 30  Obese Nutritional Risks: None Diabetes: No  How often do you need to have someone help you when you read instructions, pamphlets, or other written materials from your doctor or pharmacy?: 1 - Never What is the last grade level you completed in school?: 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 1 1  Vision? 0 0  Difficulty concentrating or making decisions? 0 0  Walking or climbing stairs? 1 1  Dressing or bathing? 0 0  Doing errands, shopping? 0 0  Preparing Food and eating ? N N  Using the Toilet? N N  In the past six months, have you accidently leaked urine? N N  Do you have problems with loss of bowel control? N N  Managing your Medications? N N  Managing your Finances? N N  Housekeeping or managing your Housekeeping? N N    Patient Care Team: [place], Buren Kos, MD as PCP - General (Family Medicine) Quintella Reichert, MD as PCP - Cardiology (Cardiology)  Indicate any recent Medical Services you may have received from other than [facility] providers in the past year (date may be  approximate).     Assessment:   This is a routine wellness examination for [place].  Hearing/Vision screen No results found.   Goals Addressed   None   Depression Screen    09/21/2023    9:52 AM 09/05/2023  1:15 PM 05/26/2023    1:18 PM 05/03/2023    1:12 PM 02/01/2023    1:34 PM 07/26/2022   10:12 AM 07/08/2022    1:20 PM  PHQ 2/9 Scores  PHQ - 2 Score 0 0 0 0 0 0 0  PHQ- 9 Score 0 0 0 0 3 2 0  Exception Documentation     Medical reason Medical reason Medical reason    Fall Risk    09/21/2023    9:51 AM 09/20/2023    8:42 AM 09/05/2023    1:15 PM 05/26/2023    1:18 PM 05/03/2023    1:12 PM  Fall Risk   Falls in the past year? 0 0 0 0 0  Number falls in past yr: 0  0 0 0  Injury with Fall? 0 0 0 0 0  Risk for fall due to : No Fall Risks  No Fall Risks No Fall Risks No Fall Risks  Follow up Falls evaluation completed  Falls evaluation completed Falls evaluation completed Falls evaluation completed    MEDICARE RISK AT HOME: Medicare Risk at Home Any stairs in or around the home?: Yes If so, are there any without handrails?: Yes Home free of loose throw rugs in walkways, pet beds, electrical cords, etc?: No Adequate lighting in your home to reduce risk of falls?: Yes Life alert?: No Use of a cane, walker or w/c?: No Grab bars in the bathroom?: Yes Shower chair or bench in shower?: Yes Elevated toilet seat or a handicapped toilet?: No  TIMED UP AND GO:  Was the test performed?  No    Cognitive Function:        09/21/2023    9:52 AM 04/02/2022   12:30 PM  6CIT Screen  What Year? 0 points 0 points  What month? 0 points 0 points  What time? 0 points 0 points  Count back from 20 0 points 0 points  Months in reverse 2 points 0 points  Repeat phrase 0 points 2 points  Total Score 2 points 2 points    Immunizations Immunization History  Administered Date(s) Administered   Fluad Quad(high Dose 65+) 04/18/2019, 05/13/2021, 07/12/2022   Fluad Trivalent(High  Dose 65+) 05/03/2023   Influenza, High Dose Seasonal PF 03/09/2016, 06/06/2018, 04/18/2019, 04/15/2020, 05/13/2021   PFIZER Comirnaty(Gray Top)Covid-19 Tri-Sucrose Vaccine 11/13/2020   PFIZER(Purple Top)SARS-COV-2 Vaccination 09/15/2019, 09/25/2019, 05/06/2020   Pfizer Covid-19 Vaccine Bivalent Booster 76yrs & up 05/13/2021   Pneumococcal Conjugate-13 10/21/2010   Pneumococcal Polysaccharide-23 06/24/2017, 12/08/2020   Respiratory Syncytial Virus Vaccine,Recomb Aduvanted(Arexvy) 07/28/2022   Zoster, Live 10/20/2005    TDAP status: Up to date  Flu Vaccine status: Up to date  Pneumococcal vaccine status: Up to date  Covid-19 vaccine status: Completed vaccines  Qualifies for Shingles Vaccine? Yes   Shingrix Completed?: Yes  Screening Tests Health Maintenance  Topic Date Due   Hepatitis C Screening  Never done   DTaP/Tdap/Td (1 - Tdap) Never done   Zoster Vaccines- Shingrix (1 of 2) 10/20/1964   COVID-19 Vaccine (6 - 2024-25 season) 04/10/2023   Medicare Annual Wellness (AWV)  09/20/2024   Pneumonia Vaccine 34+ Years old  Completed   INFLUENZA VACCINE  Completed   HPV VACCINES  Aged Out   Colonoscopy  Discontinued    Health Maintenance  Health Maintenance Due  Topic Date Due   Hepatitis C Screening  Never done   DTaP/Tdap/Td (1 - Tdap) Never done   Zoster Vaccines- Shingrix (1 of 2) 10/20/1964  COVID-19 Vaccine (6 - 2024-25 season) 04/10/2023    Colonoscopy up to date.    Additional Screening:  Hepatitis C Screening: does qualify  Vision Screening: Recommended annual ophthalmology exams for early detection of glaucoma and other disorders of the eye. Is the patient up to date with their annual eye exam?  No  Who is the provider or what is the name of the office in which the patient attends annual eye exams? Dr. Tawny Asal If pt is not established with a provider, would they like to be referred to a provider to establish care? No .   Dental Screening: Recommended  annual dental exams for proper oral hygiene    Community Resource Referral / Chronic Care Management: CRR required this visit?  No   CCM required this visit?  No     Plan:     I have personally reviewed and noted the following in the patient's chart:   Medical and social history Use of alcohol, tobacco or illicit drugs  Current medications and supplements including opioid prescriptions. Patient is not currently taking opioid prescriptions. Functional ability and status Nutritional status Physical activity Advanced directives List of other physicians Hospitalizations, surgeries, and ER visits in previous 12 months Vitals Screenings to include cognitive, depression, and falls Referrals and appointments  In addition, I have reviewed and discussed with patient certain preventive protocols, quality metrics, and best practice recommendations. A written personalized care plan for preventive services as well as general preventive health recommendations were provided to patient.     Mariadejesus Cade, Farley Ly, CMA   09/21/2023   After Visit Summary: (MyChart) Due to this being a telephonic visit, the after visit summary with patients personalized plan was offered to patient via MyChart

## 2023-09-25 ENCOUNTER — Encounter: Payer: Self-pay | Admitting: Cardiology

## 2023-09-26 ENCOUNTER — Encounter (HOSPITAL_BASED_OUTPATIENT_CLINIC_OR_DEPARTMENT_OTHER): Payer: Self-pay | Admitting: Family Medicine

## 2023-09-26 ENCOUNTER — Telehealth (HOSPITAL_COMMUNITY): Payer: Self-pay | Admitting: Cardiology

## 2023-09-26 ENCOUNTER — Encounter: Payer: Self-pay | Admitting: Dermatology

## 2023-09-26 NOTE — Telephone Encounter (Signed)
Patient cancelled echocardiogram for the reason below:  09/26/2023 10:02 AM By: Vassie Moselle T  Cancel Rsn: Patient (Having surgery)  Order will be removed from the echo WQ. If patient calls back to reschedule we will reinstate the orders. Thank you

## 2023-09-26 NOTE — Telephone Encounter (Signed)
Lm to call back ./cy 

## 2023-09-27 ENCOUNTER — Encounter (HOSPITAL_BASED_OUTPATIENT_CLINIC_OR_DEPARTMENT_OTHER): Payer: Self-pay | Admitting: Family Medicine

## 2023-09-27 ENCOUNTER — Ambulatory Visit (INDEPENDENT_AMBULATORY_CARE_PROVIDER_SITE_OTHER): Payer: Medicare HMO | Admitting: Family Medicine

## 2023-09-27 VITALS — BP 119/63 | HR 67 | Temp 97.5°F | Ht 73.0 in | Wt 324.0 lb

## 2023-09-27 DIAGNOSIS — M542 Cervicalgia: Secondary | ICD-10-CM

## 2023-09-27 NOTE — Patient Instructions (Signed)

## 2023-09-27 NOTE — Telephone Encounter (Signed)
Pt scheduled for 2/18 at 3:00

## 2023-09-27 NOTE — Assessment & Plan Note (Signed)
Right-sided neck pain started after recent drive back from [place].  Has been going on for couple weeks.  Did talk with his spine specialist who suggested possible corticosteroid injections due to arthritis in the spine, however patient was reluctant to proceed with this.  Pain has primarily been confined to right side of neck, extending down to shoulder.  Worse with certain movement, more so with head rotation to the right.  Has tried various topical and oral medications including Tylenol, Aspercreme, lidocaine.  These have provided some relief. On exam, patient is in no acute distress, vital signs stable.  Does have some mild tenderness palpation through right cervical paraspinal muscles, no spinous process tenderness.  Normal range of motion at bilateral shoulders.  Normal strength and sensation through bilateral upper extremities. Suspect underlying spasm given patient history and exam.  Discussed considerations, can continue with conservative measures, could consider utilizing Voltaren gel.  Also discussed possible referral to physical therapy as I feel that some of the modalities that they would be able to offer including ultrasound, dry needling, soft tissue mobilization would be of benefit for patient.  For now he would like to continue with conservative measures and see how the next week or so progress.  If continue to have issues, he would be amenable to referral to physical therapy, he will let us know

## 2023-09-27 NOTE — Progress Notes (Signed)
    Procedures performed today:    None.  Independent interpretation of notes and tests performed by another provider:   None.  Brief History, Exam, Impression, and Recommendations:    BP 119/63 (BP Location: Left Arm, Patient Position: Sitting, Cuff Size: Large)   Pulse 67   Temp (!) 97.5 F (36.4 C) (Oral)   Ht 6\' 1"  (1.854 m)   Wt (!) 324 lb (147 kg)   SpO2 100%   BMI 42.75 kg/m   Neck pain Assessment & Plan: Right-sided neck pain started after recent drive back from [place].  Has been going on for couple weeks.  Did talk with his spine specialist who suggested possible corticosteroid injections due to arthritis in the spine, however patient was reluctant to proceed with this.  Pain has primarily been confined to right side of neck, extending down to shoulder.  Worse with certain movement, more so with head rotation to the right.  Has tried various topical and oral medications including Tylenol, Aspercreme, lidocaine.  These have provided some relief. On exam, patient is in no acute distress, vital signs stable.  Does have some mild tenderness palpation through right cervical paraspinal muscles, no spinous process tenderness.  Normal range of motion at bilateral shoulders.  Normal strength and sensation through bilateral upper extremities. Suspect underlying spasm given patient history and exam.  Discussed considerations, can continue with conservative measures, could consider utilizing Voltaren gel.  Also discussed possible referral to physical therapy as I feel that some of the modalities that they would be able to offer including ultrasound, dry needling, soft tissue mobilization would be of benefit for patient.  For now he would like to continue with conservative measures and see how the next week or so progress.  If continue to have issues, he would be amenable to referral to physical therapy, he will let us know   Return if symptoms worsen or fail to  improve.   ___________________________________________ Kaeden Mester de [place], MD, ABFM, [facility]

## 2023-09-29 ENCOUNTER — Encounter: Payer: Medicare HMO | Admitting: Dermatology

## 2023-09-29 ENCOUNTER — Ambulatory Visit (HOSPITAL_COMMUNITY): Payer: Medicare HMO

## 2023-10-15 IMAGING — DX DG KNEE COMPLETE 4+V*R*
4 series · 4 of 4 positions shown · non-contrast
Comparison: Right knee radiographs 10/21/2006

CLINICAL DATA: Chronic bilateral knee pain.

EXAM:
RIGHT KNEE - COMPLETE 4+ VIEW

[knee ap]
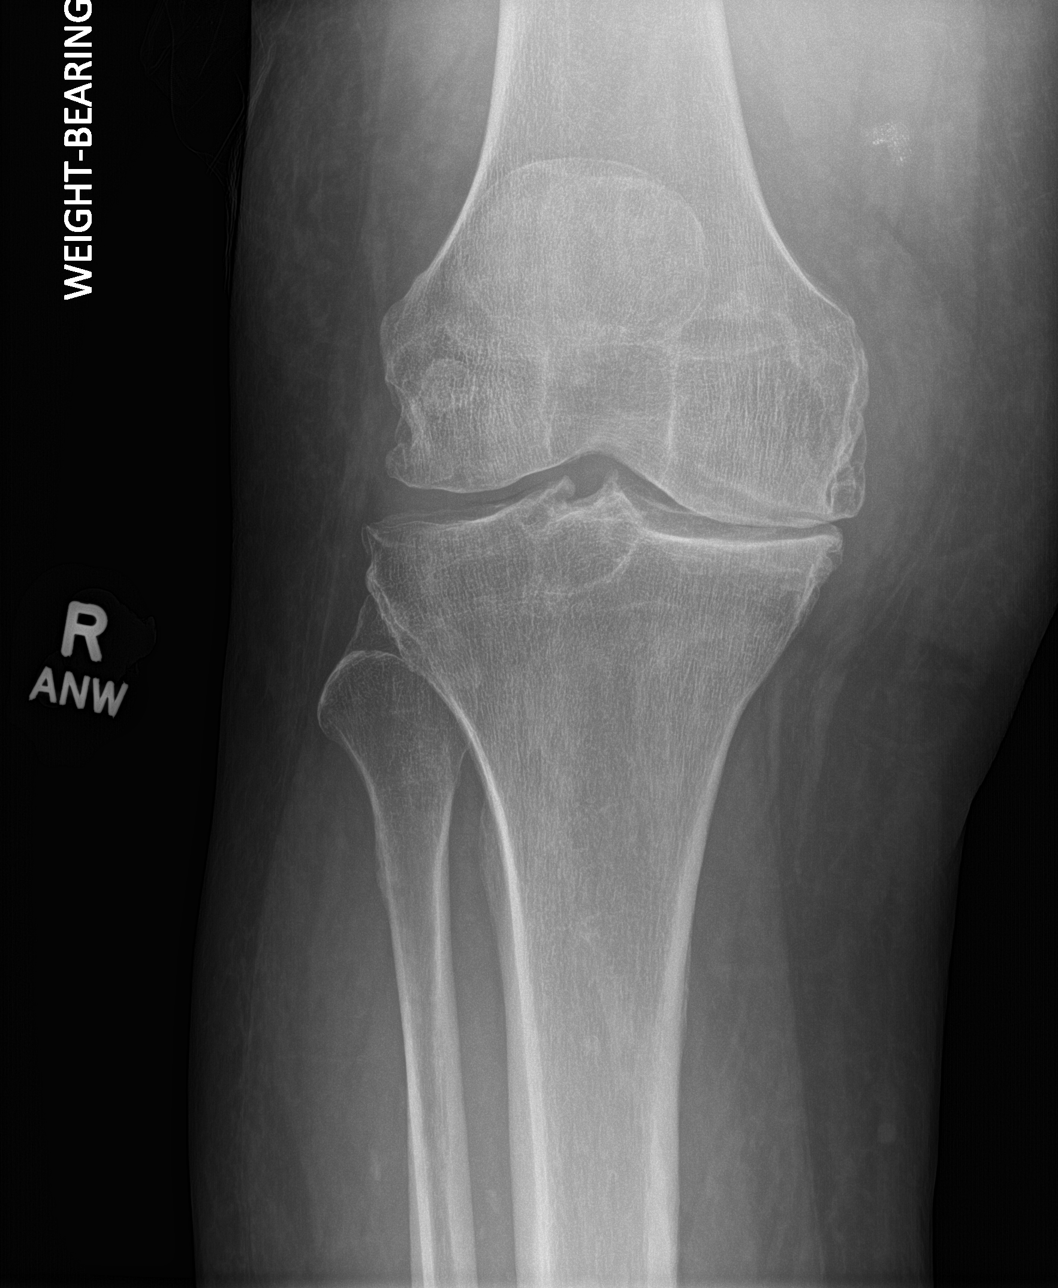

[knee tunnel]
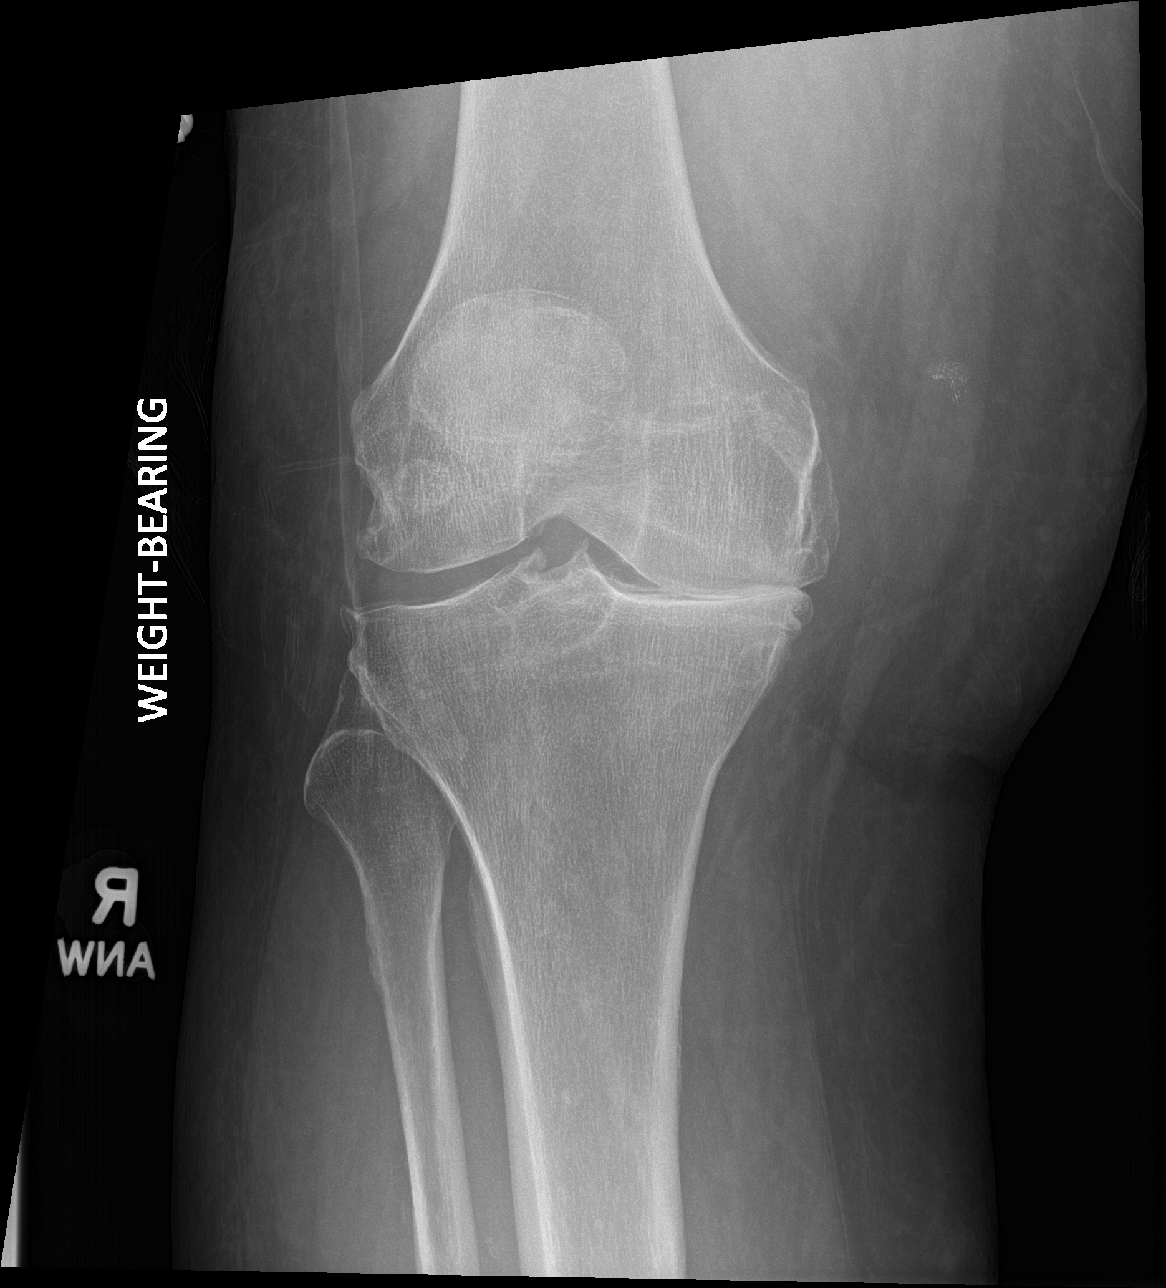

[patella sunrise]
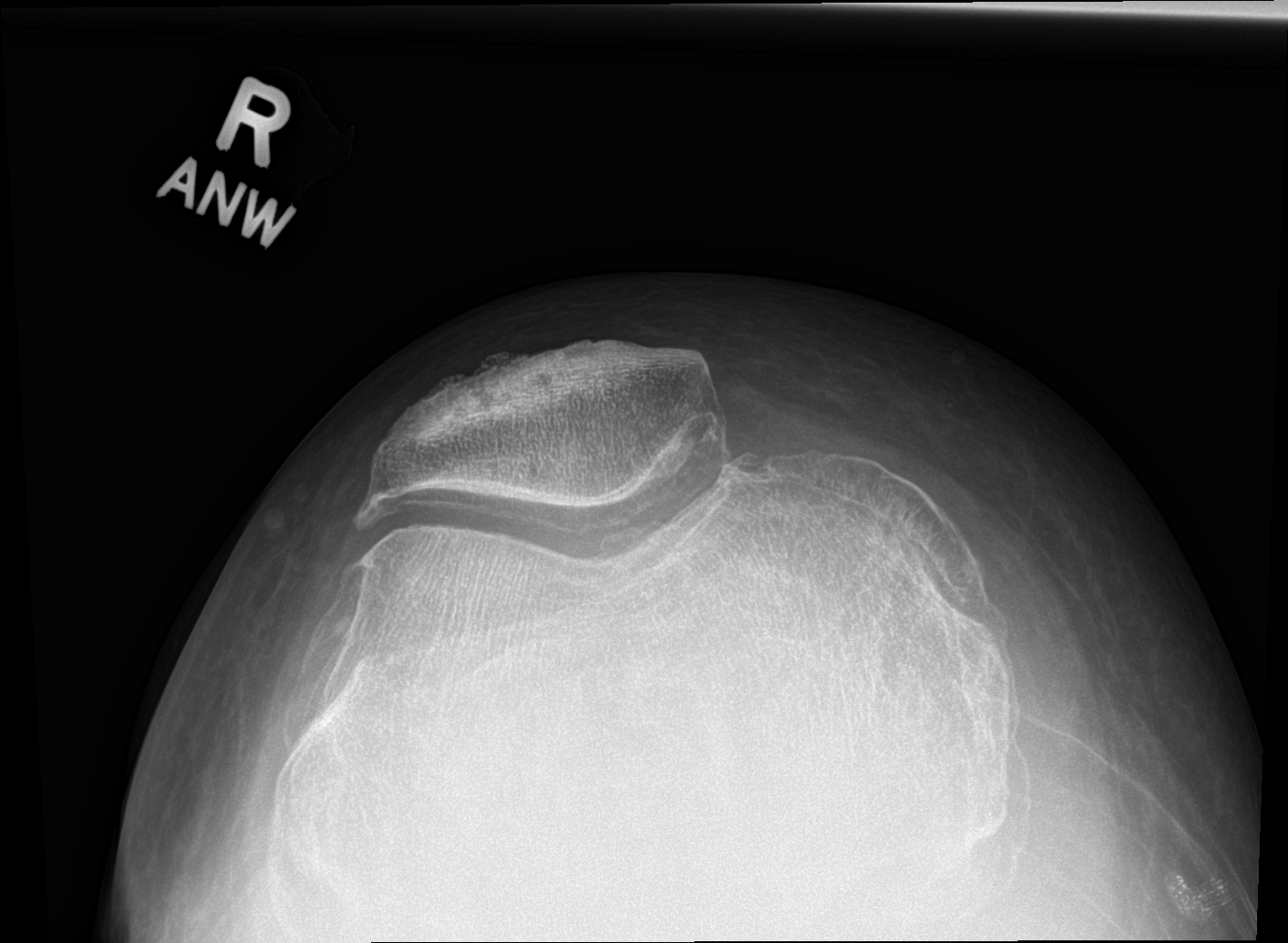

[knee lat]
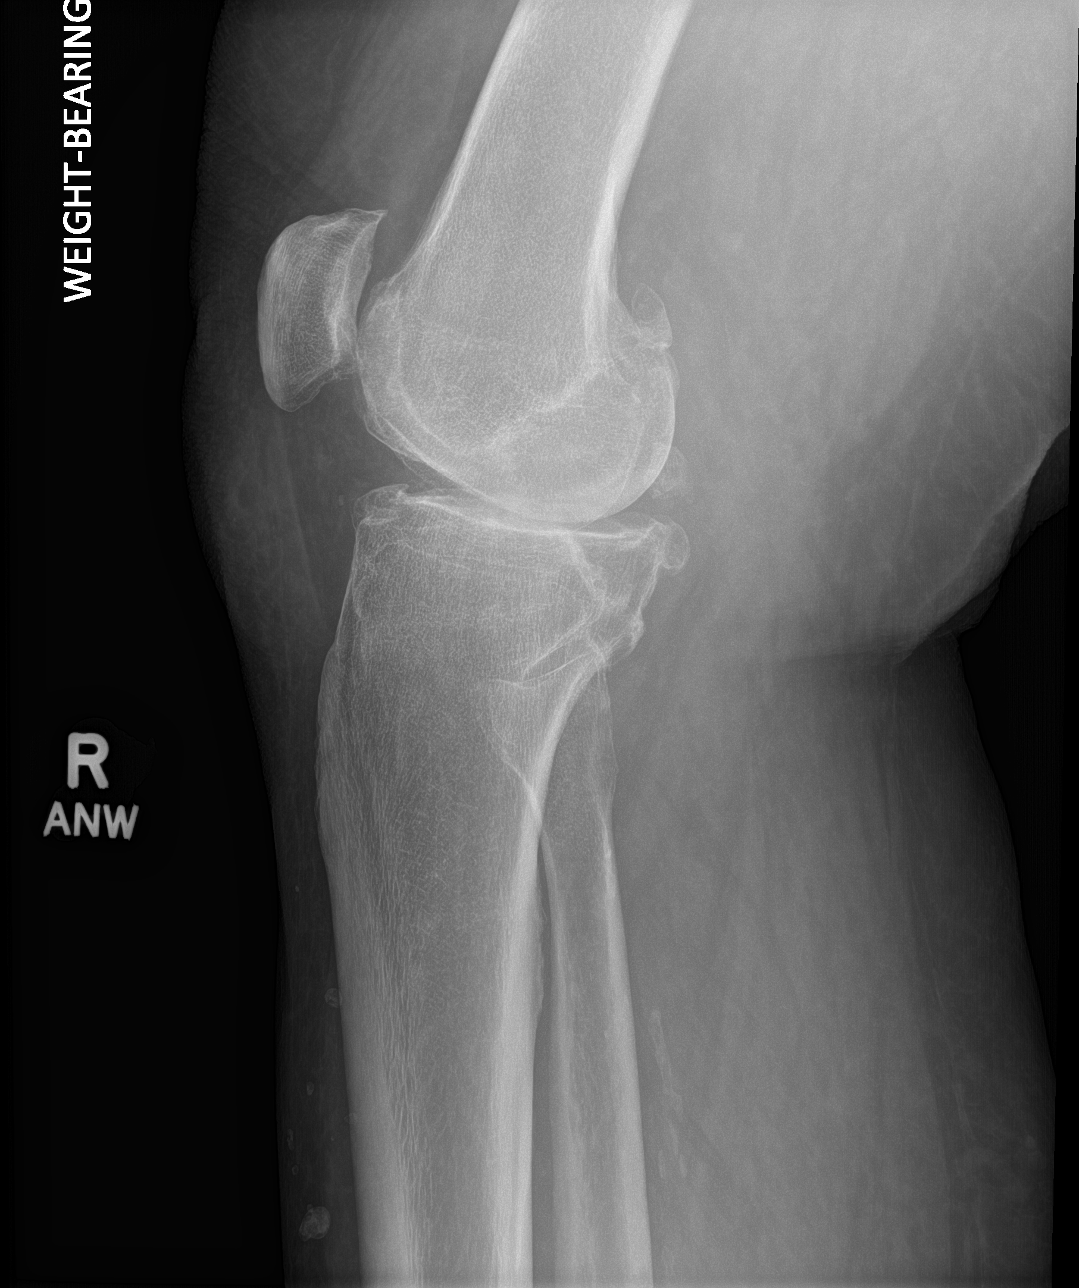

[4 of 4 positions shown; findings below may reference images not displayed]

FINDINGS: Right knee:

Severe medial compartment joint space narrowing with bone-on-bone
contact and large peripheral degenerative osteophytes. Moderate
superior and lateral patellar degenerative osteophytosis. Tiny joint
effusion. Moderate posterior femoral condyle superior degenerative
osteophytes. Moderate posterior tibial plateau peripheral
degenerative osteophytes. No acute fracture or dislocation.

Left knee:

Severe medial compartment joint space narrowing with bone-on-bone
contact, although less severe than the contralateral side. Mild
moderate superior and inferior patellar degenerative osteophytes. No
joint effusion. Moderate degenerative osteophytes of the posterior
femoral condyles and posterior tibial plateau on lateral view. No
acute fracture or dislocation.
IMPRESSION: Right knee severe medial compartment and mild-to-moderate
patellofemoral compartment osteoarthritis.

Left knee severe medial compartment and mild patellofemoral
compartment osteoarthritis.

## 2023-10-15 IMAGING — DX DG KNEE COMPLETE 4+V*L*
4 series · 4 of 4 positions shown · non-contrast
Comparison: None.

CLINICAL DATA: Chronic bilateral knee pain.

EXAM:
LEFT KNEE - COMPLETE 4+ VIEW

[knee ap]
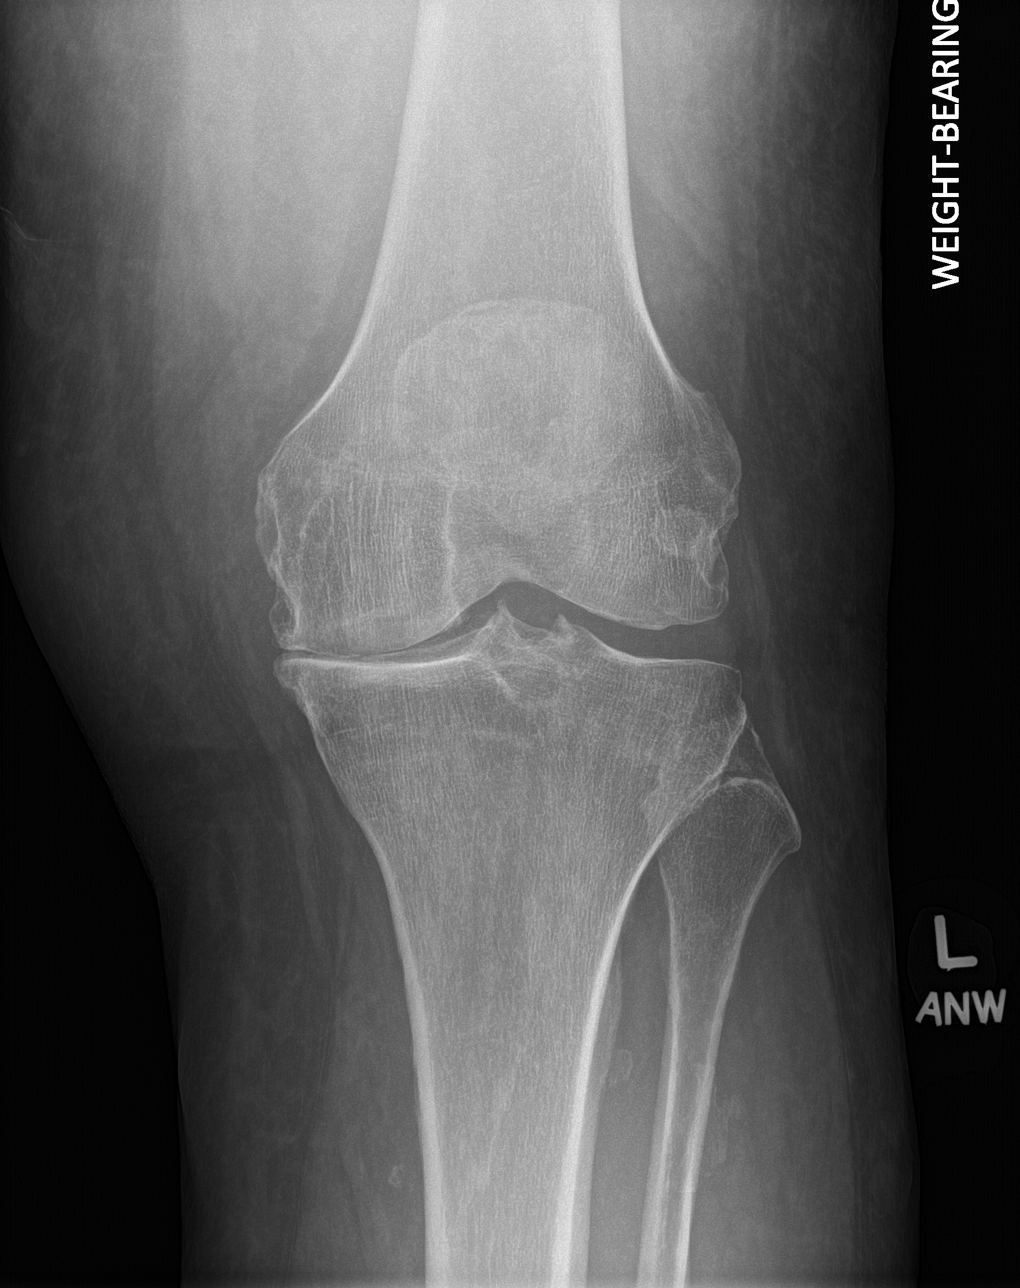

[knee tunnel]
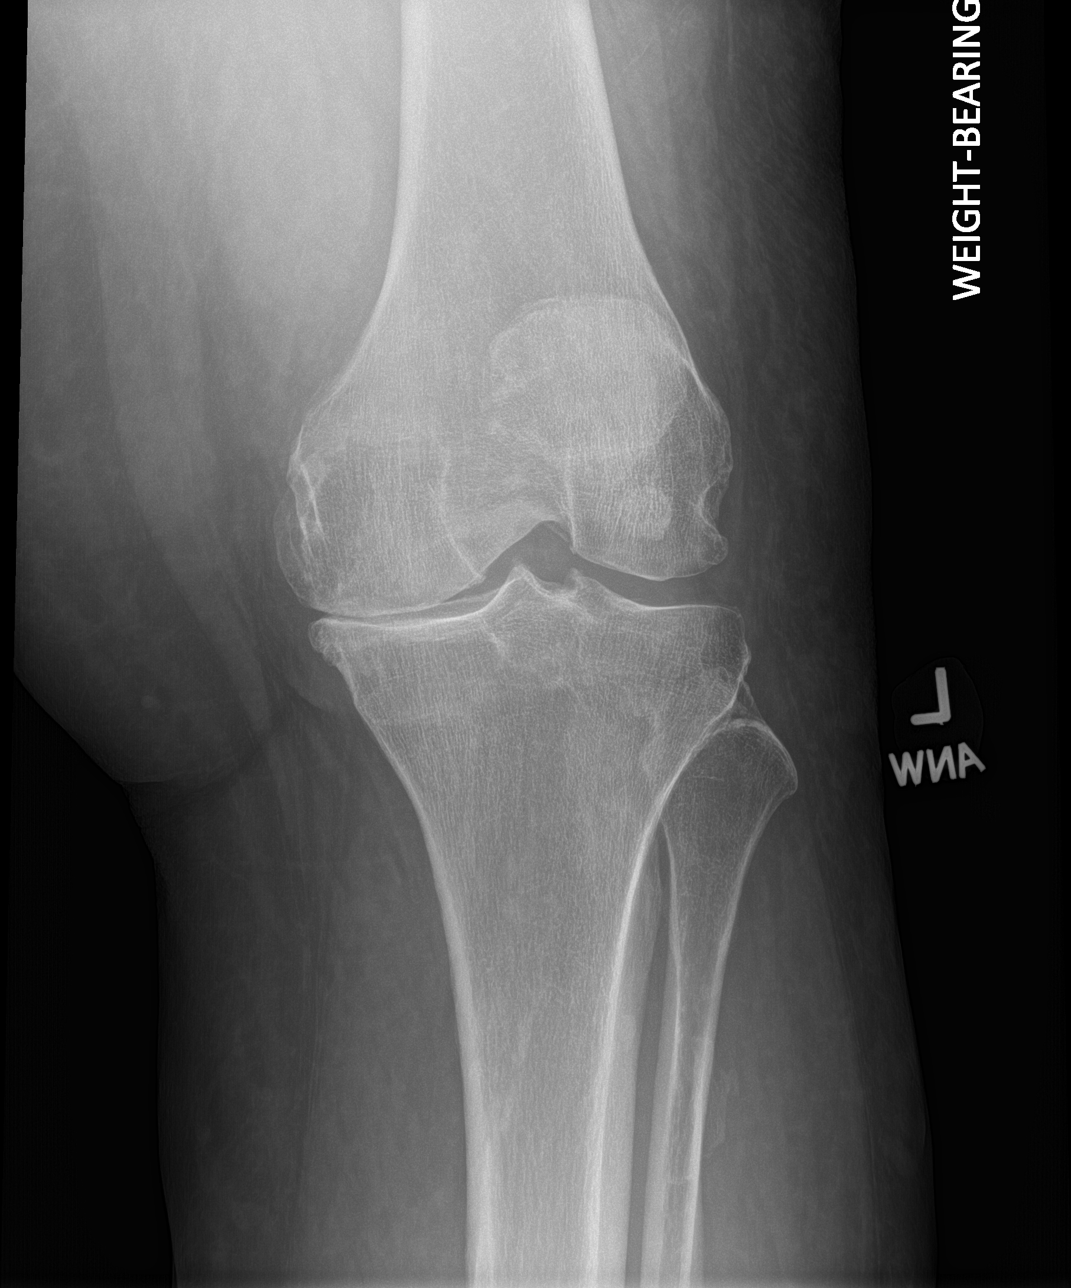

[patella sunrise]
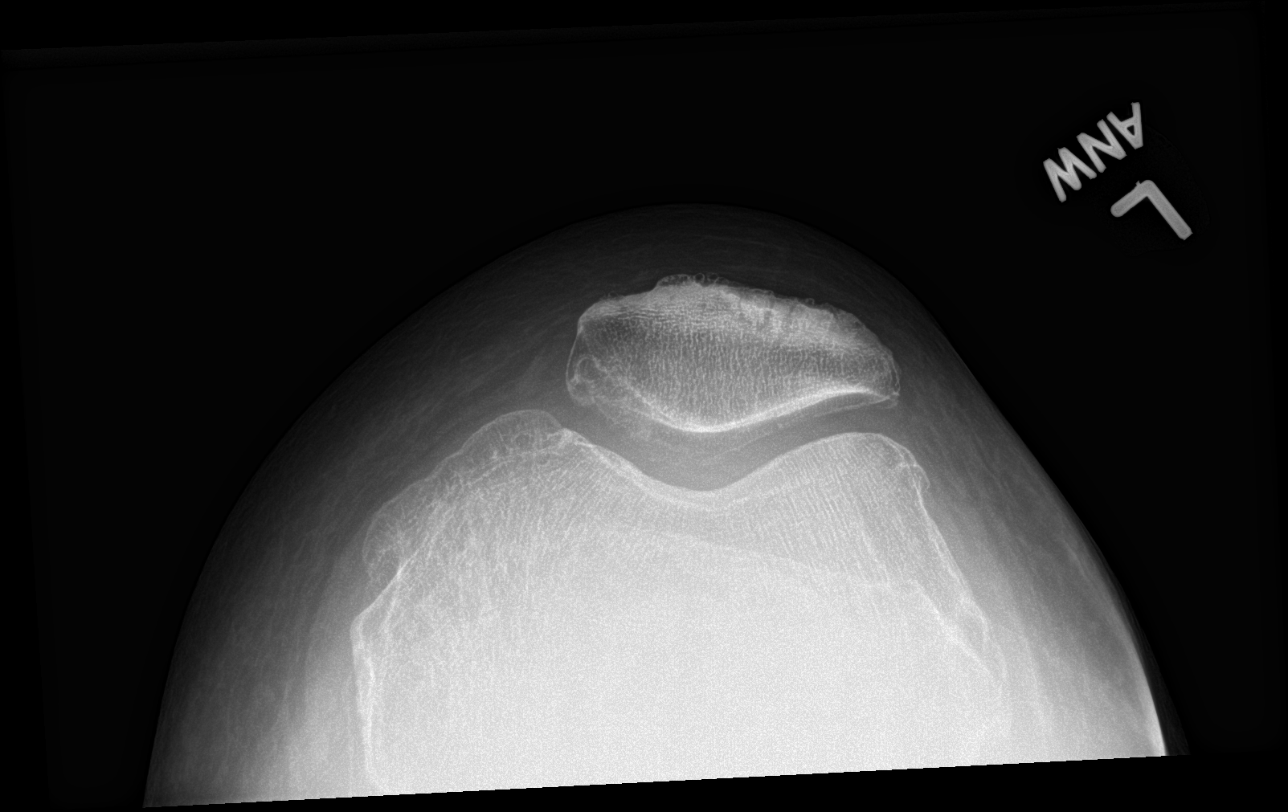

[knee lat]
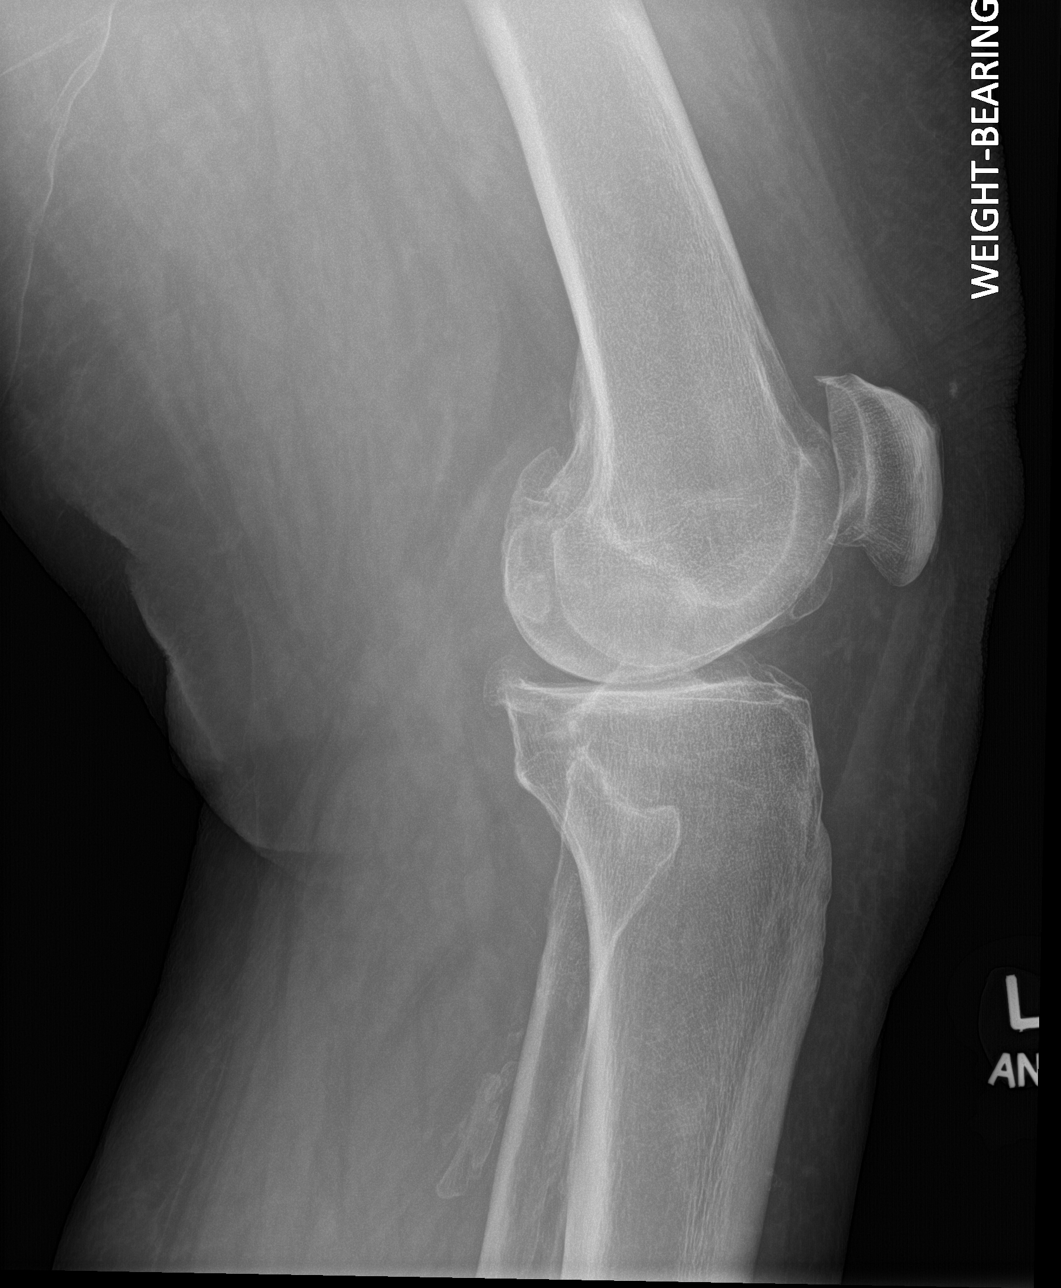

[4 of 4 positions shown; findings below may reference images not displayed]

FINDINGS: Right knee:

Severe medial compartment joint space narrowing with bone-on-bone
contact and large peripheral degenerative osteophytes. Moderate
superior and lateral patellar degenerative osteophytosis. Tiny joint
effusion. Moderate posterior femoral condyle superior degenerative
osteophytes. Moderate posterior tibial plateau peripheral
degenerative osteophytes. No acute fracture or dislocation.

Left knee:

Severe medial compartment joint space narrowing with bone-on-bone
contact, although less severe than the contralateral side. Mild
moderate superior and inferior patellar degenerative osteophytes. No
joint effusion. Moderate degenerative osteophytes of the posterior
femoral condyles and posterior tibial plateau on lateral view. No
acute fracture or dislocation.
IMPRESSION: Right knee severe medial compartment and mild-to-moderate
patellofemoral compartment osteoarthritis.

Left knee severe medial compartment and mild patellofemoral
compartment osteoarthritis.

## 2023-10-18 ENCOUNTER — Ambulatory Visit: Payer: Medicare HMO | Admitting: Dermatology

## 2023-10-18 ENCOUNTER — Encounter: Payer: Medicare HMO | Admitting: Dermatology

## 2023-10-18 ENCOUNTER — Encounter: Payer: Self-pay | Admitting: Dermatology

## 2023-10-18 VITALS — BP 127/69 | HR 82 | Temp 98.3°F

## 2023-10-18 DIAGNOSIS — C44329 Squamous cell carcinoma of skin of other parts of face: Secondary | ICD-10-CM | POA: Diagnosis not present

## 2023-10-18 DIAGNOSIS — D04122 Carcinoma in situ of skin of left lower eyelid, including canthus: Secondary | ICD-10-CM

## 2023-10-18 DIAGNOSIS — L814 Other melanin hyperpigmentation: Secondary | ICD-10-CM | POA: Diagnosis not present

## 2023-10-18 DIAGNOSIS — L579 Skin changes due to chronic exposure to nonionizing radiation, unspecified: Secondary | ICD-10-CM

## 2023-10-18 DIAGNOSIS — Z5111 Encounter for antineoplastic chemotherapy: Secondary | ICD-10-CM | POA: Diagnosis not present

## 2023-10-18 DIAGNOSIS — L570 Actinic keratosis: Secondary | ICD-10-CM

## 2023-10-18 DIAGNOSIS — W908XXA Exposure to other nonionizing radiation, initial encounter: Secondary | ICD-10-CM

## 2023-10-18 DIAGNOSIS — D099 Carcinoma in situ, unspecified: Secondary | ICD-10-CM

## 2023-10-18 DIAGNOSIS — C4492 Squamous cell carcinoma of skin, unspecified: Secondary | ICD-10-CM

## 2023-10-18 MED ORDER — FLUOROURACIL 5 % EX CREA: TOPICAL_CREAM | Freq: Two times a day (BID) | CUTANEOUS | 1 refills | Status: DC

## 2023-10-18 NOTE — Patient Instructions (Addendum)
Efudex Instructions 1. Apply a thin layer of Efudex cream to the specified sun  damaged area twice a day. The medication is to be applied to the entire  area, not just the keratoses (rough spots). Wash hands thoroughly after  application! 2. Use Efudex twice daily for 2 weeks to 4 weeks (depending on the  physician directions) or once a day for 4 weeks (depending on the  physician directions).  3. The skin will become red and scaly. This is the appropriate response.  4. Once redness starts, you may begin using Hydrocortisone 1% Cream (or  other topical steroid provided by the physician) twice a day. This helps  relieve pain and will speed the healing process. It may be applied as soon  as 30 minutes after Efudex application. 5. For excessive reactions or if suspicious for an infection or allergic  reaction, please call to make an appointment at my office so that I may  evaluate your response to treatment. 6. The area may be washed as you normally would with soap and water. Do  not wash the face for at least three hours after Efudex  application. 7. Ladies: Victor Medina is permitted throughout the entire treatment period. Wait  30 minutes after Efudex use before applying makeup directly on top of the  medication. 8. Please read the instructional pamphlet provided by the [employer] so that you may familiarize yourself with the kinds of reactions  you may experience. 9. If you have had a biopsy on any area that you will use the medication on,  DO NOT start the Efudex until your results are given to you and  the medical assistant lets you know it is okay to start the treatment   Important Information   Due to recent changes in healthcare laws, you may see results of your pathology and/or laboratory studies on MyChart before the doctors have had a chance to review them. We understand that in some cases there may be results that are confusing or concerning to you. Please understand that  not all results are received at the same time and often the doctors may need to interpret multiple results in order to provide you with the best plan of care or course of treatment. Therefore, we ask that you please give [place] 2 business days to thoroughly review all your results before contacting the office for clarification. Should we see a critical lab result, you will be contacted sooner.     If You Need Anything After Your Visit   If you have any questions or concerns for your doctor, please call our main line at [phone]. If no one answers, please leave a voicemail as directed and we will return your call as soon as possible. Messages left after 4 pm will be answered the following business day.    You may also send [place] a message via MyChart. We typically respond to MyChart messages within 1-2 business days.  For prescription refills, please ask your pharmacy to contact our office. Our fax number is [phone].  If you have an urgent issue when the clinic is closed that cannot wait until the next business day, you can page your doctor at the number below.     Please note that while we do our best to be available for urgent issues outside of office hours, we are not available 24/7.    If you have an urgent issue and are unable to reach [place], you may choose to seek medical  care at your doctor's office, retail clinic, urgent care center, or emergency room.   If you have a medical emergency, please immediately call 911 or go to the emergency department. In the event of inclement weather, please call our main line at [phone] for an update on the status of any delays or closures.  Dermatology Medication Tips: Please keep the boxes that topical medications come in in order to help keep track of the instructions about where and how to use these. Pharmacies typically print the medication instructions only on the boxes and not directly on the medication tubes.   If your medication is too  expensive, please contact our office at [phone] or send [place] a message through MyChart.    We are unable to tell what your co-pay for medications will be in advance as this is different depending on your insurance coverage. However, we may be able to find a substitute medication at lower cost or fill out paperwork to get insurance to cover a needed medication.    If a prior authorization is required to get your medication covered by your insurance company, please allow [place] 1-2 business days to complete this process.   Drug prices often vary depending on where the prescription is filled and some pharmacies may offer cheaper prices.   The website www.goodrx.com contains coupons for medications through different pharmacies. The prices here do not account for what the cost may be with help from insurance (it may be cheaper with your insurance), but the website can give you the price if you did not use any insurance.  - You can print the associated coupon and take it with your prescription to the pharmacy.  - You may also stop by our office during regular business hours and pick up a GoodRx coupon card.  - If you need your prescription sent electronically to a different pharmacy, notify our office through [facility] or by phone at [phone]

## 2023-10-18 NOTE — Progress Notes (Signed)
 Follow-Up Visit   Subjective  Victor Medina is a 78 y.o. male who presents for the following: Mohs of a Well Differentiated Squamous Cell Carcinoma on the right cheek, biopsied by Dr. Caralyn Guile on 07/21/2023.   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  The following portions of the chart were reviewed this encounter and updated as appropriate: medications, allergies, medical history  Review of Systems:  No other skin or systemic complaints except as noted in HPI or Assessment and Plan.  Objective  Well appearing patient in no apparent distress; mood and affect are within normal limits.  A focused examination was performed of the following areas: Right cheek Relevant physical exam findings are noted in the Assessment and Plan.    Left Lower Eyelid Will treat with efudex. Apply to area bid for 6 weeks  Assessment & Plan   SQUAMOUS CELL CARCINOMA OF SKIN (2) Right  Cheek Mohs surgery  Consent obtained: written  Anticoagulation: Is the patient taking prescription anticoagulant and/or aspirin prescribed/recommended by a physician? Yes   Was the anticoagulation regimen changed prior to Mohs? No    Procedure Details: Timeout: pre-procedure verification complete Procedure Prep: patient was prepped and draped in usual sterile fashion Pre-Op diagnosis: squamous cell carcinoma Surgical site (from skin exam): Right  Cheek Pre-operative length (cm): 0.7 Pre-operative width (cm): 0.5  Micrographic Surgery Details: Post-operative length (cm): 1.2 Post-operative width (cm): 1.4 Number of Mohs stages: 1  Skin repair Complexity:  Complex Final length (cm):  4.2 Informed consent: discussed and consent obtained   Timeout: patient name, date of birth, surgical site, and procedure verified   Procedure prep:  Patient was prepped and draped in usual sterile fashion Subcutaneous layers (deep stitches):  Suture size:  5-0 Suture type: Monocryl (poliglecaprone 25)    Fine/surface layer approximation (top stitches):  Suture size:  6-0 Suture type: fast-absorbing plain gut   Left Lower Eyelid Plan:  Apply efudex bid for 6 weeks  ACTINIC KERATOSIS with Squamous Cell Carcinoma in Situ of the left lower eyelid Exam: Erythematous thin papules/macules with gritty scale at the left lower eyelid  Actinic keratoses are precancerous spots that appear secondary to cumulative UV radiation exposure/sun exposure over time. They are chronic with expected duration over 1 year. A portion of actinic keratoses will progress to squamous cell carcinoma of the skin. It is not possible to reliably predict which spots will progress to skin cancer and so treatment is recommended to prevent development of skin cancer.  Recommend daily broad spectrum sunscreen SPF 30+ to sun-exposed areas, reapply every 2 hours as needed.  Recommend staying in the shade or wearing long sleeves, sun glasses (UVA+UVB protection) and wide brim hats (4-inch brim around the entire circumference of the hat). Call for new or changing lesions.  Treatment Plan: Start 5-fluorouracil cream twice a day for 45 days to affected areas including left lower eyelid.  Reviewed course of treatment and expected reaction.  Patient advised to expect inflammation and crusting and advised that erosions are possible.  Patient advised to be diligent with sun protection during and after treatment. Handout with details of how to apply medication and what to expect provided. Counseled to keep medication out of reach of children and pets.  Reviewed course of treatment and expected reaction.  Patient advised to expect inflammation and crusting and advised that erosions are possible.  Patient advised to be diligent with sun protection during and after treatment. Handout with details  of how to apply medication and what to expect provided. Counseled to keep medication out of reach of children and pets.   Return in about 2 months  (around 12/18/2023) for wound check.  Owens Shark, CMA, am acting as scribe for Gwenith Daily, MD.    10/18/2023  HISTORY OF PRESENT ILLNESS  Victor Medina is seen in consultation at the request of Dr. Caralyn Guile for biopsy-proven Well Differentiated Squamous Cell Carcinoma on the right cheek. They note that the area has been present for about 6 months increasing in size with time.  There is no history of previous treatment.  Reports no other new or changing lesions and has no other complaints today.  Medications and allergies: see patient chart.  Review of systems: Reviewed 8 systems and notable for the above skin cancer.  All other systems reviewed are unremarkable/negative, unless noted in the HPI. Past medical history, surgical history, family history, social history were also reviewed and are noted in the chart/questionnaire.    PHYSICAL EXAMINATION  General: Well-appearing, in no acute distress, alert and oriented x 4. Vitals reviewed in chart (if available).   Skin: Exam reveals a 0.7 x 0.5 cm erythematous papule and biopsy scar on the right cheek. There are rhytids, telangiectasias, and lentigines, consistent with photodamage.  Biopsy report(s) reviewed, confirming the diagnosis.   ASSESSMENT  1) Well Differentiated Squamous Cell Carcinoma on the right cheek 2) photodamage 3) solar lentigines   PLAN   1. Due to location, size, histology, or recurrence and the likelihood of subclinical extension as well as the need to conserve normal surrounding tissue, the patient was deemed acceptable for Mohs micrographic surgery (MMS).  The nature and purpose of the procedure, associated benefits and risks including recurrence and scarring, possible complications such as pain, infection, and bleeding, and alternative methods of treatment if appropriate were discussed with the patient during consent. The lesion location was verified by the patient, by reviewing previous notes, pathology  reports, and by photographs as well as angulation measurements if available.  Informed consent was reviewed and signed by the patient, and timeout was performed at 9:45 AM. See op note below.  2. For the photodamage and solar lentigines, sun protection discussed/information given on OTC sunscreens, and we recommend continued regular follow-up with primary dermatologist every 6 months or sooner for any growing, bleeding, or changing lesions. 3. Prognosis and future surveillance discussed. 4. Letter with treatment outcome sent to referring provider. 5. Pain acetaminophen/ibuprofen   MOHS MICROGRAPHIC SURGERY AND RECONSTRUCTION  Initial size:   0.7 x 0.5 cm Surgical defect/wound size: 1.2 x 1.4 cm Anesthesia:    0.33% lidocaine with 1:200,000 epinephrine EBL:    <5 mL Complications:  None Repair type:   Complex SQ suture:   5-0 Monocryl Cutaneous suture:  6-0 Plain gut Final size of the repair: 4.2 cm  Stages: 1  STAGE I: Anesthesia achieved with 0.5% lidocaine with 1:200,000 epinephrine. ChloraPrep applied. 1 section(s) excised using Mohs technique (this includes total peripheral and deep tissue margin excision and evaluation with frozen sections, excised and interpreted by the same physician). The tumor was first debulked and then excised with an approx. 2mm margin.  Hemostasis was achieved with electrocautery as needed.  The specimen was then oriented, subdivided/relaxed, inked, and processed using Mohs technique.    Frozen section analysis revealed a clear deep and peripheral margin.  Reconstruction  The surgical wound was then cleaned, prepped, and re-anesthetized as above. Wound edges were undermined extensively  along at least one entire edge and at a distance equal to or greater than the width of the defect (see wound defect size above) in order to achieve closure and decrease wound tension and anatomic distortion. Redundant tissue repair including standing cone removal was performed.  Hemostasis was achieved with electrocautery. Subcutaneous and epidermal tissues were approximated with the above sutures. The surgical site was then lightly scrubbed with sterile, saline-soaked gauze. The area was then bandaged using Vaseline ointment, non-adherent gauze, gauze pads, and tape to provide an adequate pressure dressing. The patient tolerated the procedure well, was given detailed written and verbal wound care instructions, and was discharged in good condition.   The patient will follow-up: 8 weeks.   Documentation: I have reviewed the above documentation for accuracy and completeness, and I agree with the above.  Gwenith Daily, MD

## 2023-10-20 ENCOUNTER — Ambulatory Visit (HOSPITAL_BASED_OUTPATIENT_CLINIC_OR_DEPARTMENT_OTHER): Payer: Medicare HMO | Admitting: Cardiology

## 2023-10-20 ENCOUNTER — Encounter: Payer: Self-pay | Admitting: Dermatology

## 2023-11-01 ENCOUNTER — Other Ambulatory Visit: Payer: Self-pay | Admitting: Family Medicine

## 2023-11-17 ENCOUNTER — Encounter (HOSPITAL_BASED_OUTPATIENT_CLINIC_OR_DEPARTMENT_OTHER): Payer: Self-pay | Admitting: Nurse Practitioner

## 2023-11-17 ENCOUNTER — Ambulatory Visit (HOSPITAL_BASED_OUTPATIENT_CLINIC_OR_DEPARTMENT_OTHER): Payer: Medicare HMO | Admitting: Nurse Practitioner

## 2023-11-17 VITALS — BP 130/82 | HR 83 | Ht 73.0 in | Wt 328.6 lb

## 2023-11-17 DIAGNOSIS — I77810 Thoracic aortic ectasia: Secondary | ICD-10-CM

## 2023-11-17 DIAGNOSIS — E785 Hyperlipidemia, unspecified: Secondary | ICD-10-CM

## 2023-11-17 DIAGNOSIS — I5032 Chronic diastolic (congestive) heart failure: Secondary | ICD-10-CM | POA: Diagnosis not present

## 2023-11-17 DIAGNOSIS — I493 Ventricular premature depolarization: Secondary | ICD-10-CM

## 2023-11-17 DIAGNOSIS — I4510 Unspecified right bundle-branch block: Secondary | ICD-10-CM

## 2023-11-17 DIAGNOSIS — I480 Paroxysmal atrial fibrillation: Secondary | ICD-10-CM

## 2023-11-17 DIAGNOSIS — Z7901 Long term (current) use of anticoagulants: Secondary | ICD-10-CM

## 2023-11-17 DIAGNOSIS — G4733 Obstructive sleep apnea (adult) (pediatric): Secondary | ICD-10-CM | POA: Diagnosis not present

## 2023-11-17 DIAGNOSIS — I10 Essential (primary) hypertension: Secondary | ICD-10-CM | POA: Diagnosis not present

## 2023-11-17 NOTE — Patient Instructions (Addendum)
 Medication Instructions:  Your physician recommends that you continue on your current medications as directed. Please refer to the Current Medication list given to you today.   *If you need a refill on your cardiac medications before your next appointment, please call your pharmacy*  Lab Work: Your physician recommends that you return for lab work in: CBC, CMP and LIPID  If you have labs (blood work) drawn today and your tests are completely normal, you will receive your results only by: MyChart Message (if you have MyChart) OR A paper copy in the mail If you have any lab test that is abnormal or we need to change your treatment, we will call you to review the results.  Testing/Procedures: Your physician has requested that you have an echocardiogram. Echocardiography is a painless test that uses sound waves to create images of your heart. It provides your doctor with information about the size and shape of your heart and how well your heart's chambers and valves are working. This procedure takes approximately one hour. There are no restrictions for this procedure. Please do NOT wear cologne, perfume, aftershave, or lotions (deodorant is allowed). Please arrive 15 minutes prior to your appointment time.  Please note: We ask at that you not bring children with you during ultrasound (echo/ vascular) testing. Due to room size and safety concerns, children are not allowed in the ultrasound rooms during exams. Our front office staff cannot provide observation of children in our lobby area while testing is being conducted. An adult accompanying a patient to their appointment will only be allowed in the ultrasound room at the discretion of the ultrasound technician under special circumstances. We apologize for any inconvenience.   Follow-Up: At [facility], you and your health needs are our priority.  As part of our continuing mission to provide you with exceptional heart care, our providers  are all part of one team.  This team includes your primary Cardiologist (physician) and Advanced Practice Providers or APPs (Physician Assistants and Nurse Practitioners) who all work together to provide you with the care you need, when you need it.  Your next appointment:   12 month(s)  Provider:   Jodelle Red, MD, Eligha Bridegroom, NP, or Gillian Shields, NP    We recommend signing up for the patient portal called "MyChart".  Sign up information is provided on this After Visit Summary.  MyChart is used to connect with patients for Virtual Visits (Telemedicine).  Patients are able to view lab/test results, encounter notes, upcoming appointments, etc.  Non-urgent messages can be sent to your provider as well.   To learn more about what you can do with MyChart, go to [email or web address].

## 2023-11-17 NOTE — Progress Notes (Signed)
**Note Victor-Identified via Obfuscation**  Cardiology Office Note:  .   Date:  11/17/2023  ID:  Victor Medina, DOB November 30, 1945, MRN [id number] PCP: Victor Medina, Victor J, MD  [facility] Providers Cardiologist:  Victor Magic, MD    Patient Profile: .      PMH PAF on chronic anticoagulation Dilated aortic root Dilated 38 mm on echo 2018 and 40 mm in 2024 Ascending aorta 41 mm by echo 2024 Chronic HFpEF Chronic LE edema RBBB OSA on CPAP Obesity Hypertension Cervical fusion  He underwent echocardiogram 09/2016 with normal LVEF and normal aortic root. He underwent cardioversion in 2016.  He had recurrent A-fib and underwent DCCV May 2023 but reverted back to A-fib and was seen in A-fib clinic on 04/23/2022. Beta-blocker was increased and he underwent repeat cardioversion in the ER September 2023. Follow-up in A-fib clinic 04/27/2022 at which time he was started on Multaq 400 mg twice daily.  Last cardiology clinic visit was 02/22/2023 with Dr. Mayford Medina.  He reported tolerating PAP device and no significant daytime sleepiness. He felt hydrochlorothiazide improved LE edema better than Lasix. He was advised to follow-up with  lipid clinic due to intolerance of Crestor.        History of Present Illness: .   Victor Medina is a 78 y.o. male who is here today for follow-up of atrial fibrillation.  He reports if it were not for his knees he would feel great.  Unfortunately he also has frozen right shoulder.  He continues to workout at [facility] well 3 days a week completing 1 mile in about 15 minutes on the cross trainer as well as using various machines. He reports no concerning symptoms of A-fib. Reports he has had PVCs for many years and does not feel them. He denies chest pain, palpitations, shortness of breath, orthopnea, PND, presyncope, syncope.  He has occasional right arm numbness he thinks is secondary to previous cervical fusion. He uses his CPAP machine and has not had any issues with it for a long time.  He reports a slow  digestive system which makes it difficult for him to lose weight.  He has chronic bilateral LE edema.  No new or worsening concerns per his report.   Discussed the use of AI scribe software for clinical note transcription with the patient, who gave verbal consent to proceed.   ROS: See HPI       Studies Reviewed: Marland Kitchen   EKG Interpretation Date/Time:  Thursday November 17 2023 13:58:14 EDT Ventricular Rate:  83 PR Interval:  144 QRS Duration:  136 QT Interval:  400 QTC Calculation: 470 R Axis:   -51  Text Interpretation: Sinus rhythm with occasional Premature ventricular complexes Left axis deviation Right bundle branch block Minimal voltage criteria for LVH, may be normal variant ( R in aVL ) No acute changes Confirmed by Victor Medina [id number]) on 11/17/2023 2:09:56 PM     No results found for: "LIPOA"   Risk Assessment/Calculations:     CHA2DS2-VASc Score = 4  The patient's score is based upon: CHF History: 1 HTN History: 1 Diabetes History: 0 Stroke History: 0 Vascular Disease History: 0 Age Score: 2 Gender Score: 0              Physical Exam:   VS:  BP 130/82   Pulse 83   Ht 6\' 1"  (1.854 m)   Wt (!) 328 lb 9.6 oz (149.1 kg)   SpO2 95%   BMI 43.35 kg/m    Wt Readings  from Last 3 Encounters:  11/17/23 (!) 328 lb 9.6 oz (149.1 kg)  09/27/23 (!) 324 lb (147 kg)  09/21/23 (!) 324 lb (147 kg)    GEN: Obese, well developed in no acute distress NECK: No JVD; No carotid bruits CARDIAC: RRR, no murmurs, rubs, gallops RESPIRATORY:  Clear to auscultation without rales, wheezing or rhonchi  ABDOMEN: Soft, non-tender, non-distended EXTREMITIES:  No edema; No deformity     ASSESSMENT AND PLAN: .    Assessment & Plan Atrial fibrillation   Episodes have resolved after increasing dietary potassium.  HR is well-controlled.  He is on Multaq, diltiazem, and metoprolol. No tachypalpitations per his report.  No bleeding concerns.  Continue Eliquis 5 mg twice daily which is  appropriate dose for stroke prevention for CHA2DS2-VASc score of 4.  We will check CBC and CMET for monitoring.   Chronic HFpEF Echo 09/28/22 with normal LVEF 60-65%, G1DD, mild LVH, normal RV. He has chronic bilateral LE edema, otherwise volume status appears stable although it is difficult to assess due to body habitus.   Premature ventricular contractions (PVCs)   Recent EKG shows PVCs, but he remains asymptomatic. Continue metoprolol and diltiazem.   Hypertension BP is well controlled. We are updating labs to evaluate renal function. No change in anti-hypertensive therapy today.   Right Bundle branch block   Known. Normal heart function on echo 09/28/2022.   Ascending aortic dilatation Enlargement of aortic root and ascending aorta at 40 and 41 mm respectively.  We will get echo for surveillance of aorta.  He is asymptomatic.  Hyperlipidemia   Cholesterol levels were last checked a year ago. Order fasting blood work to update lipid profile, blood counts, and kidney function.  Obstructive sleep apnea   He has been using CPAP for 13 years without issues. He wants to move all of his providers to [facility]. Advised he will need to continue to see Dr. Mayford Medina for sleep management.         Disposition:1 year with Dr. Cristal Medina  Signed, Victor Bridegroom, NP-C

## 2023-11-22 ENCOUNTER — Encounter (HOSPITAL_BASED_OUTPATIENT_CLINIC_OR_DEPARTMENT_OTHER): Payer: Self-pay | Admitting: Family Medicine

## 2023-11-23 ENCOUNTER — Encounter (HOSPITAL_BASED_OUTPATIENT_CLINIC_OR_DEPARTMENT_OTHER): Payer: Self-pay | Admitting: Family Medicine

## 2023-11-23 ENCOUNTER — Ambulatory Visit (HOSPITAL_BASED_OUTPATIENT_CLINIC_OR_DEPARTMENT_OTHER): Admitting: Family Medicine

## 2023-11-23 VITALS — BP 105/67 | HR 69 | Ht 73.0 in | Wt 328.6 lb

## 2023-11-23 DIAGNOSIS — M542 Cervicalgia: Secondary | ICD-10-CM | POA: Diagnosis not present

## 2023-11-23 DIAGNOSIS — H109 Unspecified conjunctivitis: Secondary | ICD-10-CM | POA: Diagnosis not present

## 2023-11-23 MED ORDER — POLYMYXIN B-TRIMETHOPRIM 10000-0.1 UNIT/ML-% OP SOLN: 2.0000 [drp] | Freq: Four times a day (QID) | OPHTHALMIC | 0 refills | Status: AC

## 2023-11-23 NOTE — Assessment & Plan Note (Addendum)
 Patient with recent issue of right arm numbness when sleeping.  Notable history related to neck pain and neck procedure/surgery.  Given symptoms, recommend that he arrange for further follow-up with spine specialist.

## 2023-11-23 NOTE — Progress Notes (Signed)
    Procedures performed today:    None.  Independent interpretation of notes and tests performed by another provider:   None.  Brief History, Exam, Impression, and Recommendations:    BP 105/67 (BP Location: Right Arm, Patient Position: Sitting, Cuff Size: Large)   Pulse 69   Ht 6\' 1"  (1.854 m)   Wt (!) 328 lb 9.6 oz (149.1 kg)   SpO2 97%   BMI 43.35 kg/m   Conjunctivitis of left eye, unspecified conjunctivitis type Assessment & Plan: Patient reports being started on topical treatment by dermatology.  He has been utilizing fluorouracil to left lower eyelid.  About 1 week ago he began to notice left eye irritation, redness, discharge.  Continues to have symptoms now.  Describes discharge as thick, discolored.  Notes some crusting.  Has not tried any specific treatment as of yet.  No specific eye pain, no change in visual acuity.  He does have an eye doctor, but has not had recent appointment. Left eye with notable conjunctival injection, some purulent discharge present, extraocular movements intact.  No tenderness about the eye, no swelling or eye protrusion. Given history and exam, concern for possible bacterial conjunctivitis.  Discussed consideration, can proceed with antibiotic drops as below instructed on proper use and expected progress moving forward.  He does plan to schedule appointment with his eye doctor as he is needing to schedule follow-up anyways.  Discussed recommended hygiene measures.   Neck pain Assessment & Plan: Patient with recent issue of right arm numbness when sleeping.  Notable history related to neck pain and neck procedure/surgery.  Given symptoms, recommend that he arrange for further follow-up with spine specialist.   Other orders -     Polymyxin B-Trimethoprim; Place 2 drops into the left eye every 6 (six) hours for 7 days.  Dispense: 10 mL; Refill: 0  Return if symptoms worsen or fail to improve.  Recommend keeping scheduled  appointment   ___________________________________________ Lindsy Cerullo de [place], MD, ABFM, [facility]

## 2023-11-23 NOTE — Assessment & Plan Note (Signed)
 Patient reports being started on topical treatment by dermatology.  He has been utilizing fluorouracil to left lower eyelid.  About 1 week ago he began to notice left eye irritation, redness, discharge.  Continues to have symptoms now.  Describes discharge as thick, discolored.  Notes some crusting.  Has not tried any specific treatment as of yet.  No specific eye pain, no change in visual acuity.  He does have an eye doctor, but has not had recent appointment. Left eye with notable conjunctival injection, some purulent discharge present, extraocular movements intact.  No tenderness about the eye, no swelling or eye protrusion. Given history and exam, concern for possible bacterial conjunctivitis.  Discussed consideration, can proceed with antibiotic drops as below instructed on proper use and expected progress moving forward.  He does plan to schedule appointment with his eye doctor as he is needing to schedule follow-up anyways.  Discussed recommended hygiene measures.

## 2023-11-23 NOTE — Patient Instructions (Signed)

## 2023-12-05 DIAGNOSIS — M542 Cervicalgia: Secondary | ICD-10-CM | POA: Diagnosis not present

## 2023-12-08 ENCOUNTER — Ambulatory Visit (HOSPITAL_BASED_OUTPATIENT_CLINIC_OR_DEPARTMENT_OTHER): Admitting: Student

## 2023-12-08 ENCOUNTER — Encounter (HOSPITAL_BASED_OUTPATIENT_CLINIC_OR_DEPARTMENT_OTHER): Payer: Self-pay | Admitting: Student

## 2023-12-08 DIAGNOSIS — M170 Bilateral primary osteoarthritis of knee: Secondary | ICD-10-CM

## 2023-12-08 MED ORDER — TRIAMCINOLONE ACETONIDE 40 MG/ML IJ SUSP
2.0000 mL | INTRAMUSCULAR | Status: AC | PRN
  Administered 2023-12-08: 2 mL via INTRA_ARTICULAR

## 2023-12-08 MED ORDER — LIDOCAINE HCL 1 % IJ SOLN
4.0000 mL | INTRAMUSCULAR | Status: AC | PRN
  Administered 2023-12-08: 4 mL

## 2023-12-08 NOTE — Progress Notes (Signed)
 Chief Complaint: Bilateral knee pain     History of Present Illness:    Victor Medina is a 78 y.o. male presenting today for follow-up evaluation of bilateral knee osteoarthritis.  His right knee continues to be more symptomatic than the left.  Reports today that he has been continuing to exercise at the gym, however his knees have been preventing him from being able to do some lower body and cardio type exercises.  Last cortisone injections were performed in August 2024 with some relief, followed by bilateral Durolane injections in October 2024 which did not provide any significant relief.  No new symptoms or injuries.   Surgical History:   Right knee menisectomy  PMH/PSH/Family History/Social History/Meds/Allergies:    Past Medical History:  Diagnosis Date   Acquired dilation of ascending aorta and aortic root (HCC)    41 mm and 40 mm respectively   Arthritis    Asthma    Cancer (HCC)    mild skin   Chronic diastolic CHF (congestive heart failure) (HCC)    GERD (gastroesophageal reflux disease)    Hypertension    Morbid obesity (HCC)    OSA (obstructive sleep apnea)    PAF (paroxysmal atrial fibrillation) (HCC) 08/14/2015   s/p TEE/DCCV to NSR;  denies palplita    RBBB    atrial fib on cardizem    Past Surgical History:  Procedure Laterality Date   ANTERIOR CERVICAL DECOMP/DISCECTOMY FUSION N/A 01/13/2023   Procedure: CERVICAL 4- CERVICAL 5, CERVICAL 5- CERVICAL 6 ANTERIOR CERVICAL DECOMPRESSION FUSION WITH INSTRUMENTATION AND ALLOGRAFT;  Surgeon: Virl Grimes, MD;  Location: [facility] OR;  Service: Orthopedics;  Laterality: N/A;   CARDIOVERSION N/A 05/19/2015   Procedure: CARDIOVERSION;  Surgeon: Elmyra Haggard, MD;  Location: [facility] ENDOSCOPY;  Service: Cardiovascular;  Laterality: N/A;   CARDIOVERSION N/A 12/17/2021   Procedure: CARDIOVERSION;  Surgeon: Elmyra Haggard, MD;  Location: [facility] ENDOSCOPY;  Service: Cardiovascular;  Laterality: N/A;    CATARACT EXTRACTION W/ INTRAOCULAR LENS IMPLANT Right    CHOLECYSTECTOMY N/A 12/01/2012   Procedure: LAPAROSCOPIC CHOLECYSTECTOMY;  Surgeon: Diantha Fossa, MD;  Location: [facility] OR;  Service: General;  Laterality: N/A;   COLONOSCOPY WITH PROPOFOL  N/A 07/13/2019   Procedure: COLONOSCOPY WITH PROPOFOL ;  Surgeon: Alvis Jourdain, MD;  Location: [facility] ENDOSCOPY;  Service: Endoscopy;  Laterality: N/A;   MASS EXCISION Right 02/20/2018   Procedure: RIGHT THUMB EXCISION MASS AND DEBRIDEMENT DISTAL INTRPHLANGEAL JOINT;  Surgeon: Brunilda Capra, MD;  Location: [facility];  Service: Orthopedics;  Laterality: Right;   POLYPECTOMY  07/13/2019   Procedure: POLYPECTOMY;  Surgeon: Alvis Jourdain, MD;  Location: [facility] ENDOSCOPY;  Service: Endoscopy;;   right knee     meniscus removal 6 years ago   SHOULDER SURGERY Left    Rotator Cuff Repair   TEE WITHOUT CARDIOVERSION N/A 05/19/2015   Procedure: TRANSESOPHAGEAL ECHOCARDIOGRAM (TEE);  Surgeon: Elmyra Haggard, MD;  Location: [facility] ENDOSCOPY;  Service: Cardiovascular;  Laterality: N/A;   Social History   Socioeconomic History   Marital status: Married    Spouse name: Not on file   Number of children: Not on file   Years of education: Not on file   Highest education level: Associate degree: occupational, Scientist, product/process development, or vocational program  Occupational History   Not on file  Tobacco Use   Smoking  status: Former    Types: Cigarettes    Passive exposure: Past   Smokeless tobacco: Never   Tobacco comments:    Quit 45 years ago as of 2024  Vaping Use   Vaping status: Never Used  Substance and Sexual Activity   Alcohol use: Not Currently   Drug use: No   Sexual activity: Never  Other Topics Concern   Not on file  Social History Narrative   Not on file   Social Drivers of Health   Financial Resource Strain: Low Risk  (09/01/2023)   Overall Financial Resource Strain (CARDIA)    Difficulty of Paying Living Expenses: Not hard at all  Food Insecurity: No  Food Insecurity (09/01/2023)   Hunger Vital Sign    Worried About Running Out of Food in the Last Year: Never true    Ran Out of Food in the Last Year: Never true  Transportation Needs: No Transportation Needs (09/01/2023)   PRAPARE - Administrator, Civil Service (Medical): No    Lack of Transportation (Non-Medical): No  Physical Activity: Sufficiently Active (09/01/2023)   Exercise Vital Sign    Days of Exercise per Week: 3 days    Minutes of Exercise per Session: 60 min  Stress: No Stress Concern Present (09/01/2023)   [employer] of Occupational Health - Occupational Stress Questionnaire    Feeling of Stress : Not at all  Social Connections: Unknown (09/21/2023)   Social Connection and Isolation Panel [NHANES]    Frequency of Communication with Friends and Family: Patient declined    Frequency of Social Gatherings with Friends and Family: Patient declined    Attends Religious Services: Never    Database administrator or Organizations: No    Attends Engineer, structural: Never    Marital Status: Married   Family History  Problem Relation Age of Onset   Hypotension Mother    Heart disease Sister    No Known Allergies  Current Outpatient Medications  Medication Sig Dispense Refill   acetaminophen  (TYLENOL ) 500 MG tablet Take 1,000 mg by mouth every 6 (six) hours as needed for moderate pain.     apixaban  (ELIQUIS ) 5 MG TABS tablet Take 5 mg by mouth 2 (two) times daily.     APPLE CIDER VINEGAR PO Take 2 capsules by mouth daily.     Cholecalciferol  (VITAMIN D3) 2000 UNITS TABS Take 4,000 Units by mouth daily.      diltiazem  (CARDIZEM  CD) 120 MG 24 hr capsule TAKE 1 CAPSULE BY MOUTH 2 TIMES DAILY. 180 capsule 1   dronedarone  (MULTAQ ) 400 MG tablet Take 1 tablet (400 mg total) by mouth 2 (two) times daily with a meal. 30 tablet 0   fluorouracil  (EFUDEX ) 5 % cream Apply topically 2 (two) times daily. 40 g 1   fluticasone  (FLONASE ) 50 MCG/ACT nasal spray Place  1 spray into both nostrils at bedtime.     furosemide  (LASIX ) 20 MG tablet Take 1 tablet (20 mg total) by mouth daily. Take 2 tabs daily for 3 days, then reduce dose to one tablet daily. 90 tablet 3   hydrochlorothiazide  (HYDRODIURIL ) 25 MG tablet Take 1 tablet (25 mg total) by mouth daily. 90 tablet 3   loratadine  (CLARITIN ) 10 MG tablet Take 10 mg by mouth daily.     Magnesium 250 MG TABS Take 250 mg by mouth 2 (two) times daily.     methocarbamol  (ROBAXIN ) 500 MG tablet Take 500 mg by mouth as needed for  muscle spasms.     metoprolol  tartrate (LOPRESSOR ) 25 MG tablet TAKE 1 AND 1/2 TABLETS BY MOUTH TWICE DAILY 270 tablet 1   Multiple Vitamins-Minerals (MULTIVITAMIN ADULT) CHEW Chew 2 each by mouth daily.     tamsulosin  (FLOMAX ) 0.4 MG CAPS capsule TAKE 1 CAPSULE BY MOUTH EVERYDAY AT BEDTIME 90 capsule 0   trolamine salicylate (ASPERCREME) 10 % cream Apply 1 application topically as needed for muscle pain.     WIXELA INHUB 100-50 MCG/ACT AEPB INHALE 1 PUFF INTO THE LUNGS TWICE A DAY 60 each 3   No current facility-administered medications for this visit.   No results found.  Review of Systems:   A ROS was performed including pertinent positives and negatives as documented in the HPI.  Physical Exam :   Constitutional: NAD and appears stated age Neurological: Alert and oriented Psych: Appropriate affect and cooperative There were no vitals taken for this visit.   Comprehensive Musculoskeletal Exam:    Tenderness in bilateral knees over the medial joint lines.  Active range of motion from 0 to 100 degrees with significant crepitus.  No overlying erythema or warmth.  Minimal effusions present.  No laxity with varus or valgus stress.  Imaging:      Assessment:   78 y.o. male with advanced tricompartmental bilateral knee osteoarthritis.  This appears bone-on-bone within the medial compartments.  He has had discussion with Dr. Lucienne Ryder concerning knee replacement, however he remains  over the BMI restriction for total joint replacement.  He did trial Dermalene injections in October 2024 which she states did not give him any relief.  Last cortisone injections on 03/11/2023 did give him some symptom relief.  Patient would like to repeat cortisone injections today which I am agreeable to.  Injections performed in both knees today without any complication.  Patient will continue with exercise and weight loss with the goal of becoming eligible for knee replacement surgery.  In the meantime can have him follow-up as needed for repeat injections, with at least 3 months needed between cortisone injections.  Plan :    - Bilateral knee cortisone injections performed today - Return to clinic as needed    Procedure Note  Patient: Victor Medina             Date of Birth: Aug 10, 1945           MRN: [id number]             Visit Date: 12/08/2023  Procedures: Visit Diagnoses:  1. Bilateral primary osteoarthritis of knee      Large Joint Inj: bilateral knee on 12/08/2023 12:10 PM Indications: pain Details: 22 G 1.5 in needle, anterolateral approach Medications (Right): 4 mL lidocaine  1 %; 2 mL triamcinolone  acetonide 40 MG/ML Medications (Left): 4 mL lidocaine  1 %; 2 mL triamcinolone  acetonide 40 MG/ML Outcome: tolerated well, no immediate complications Procedure, treatment alternatives, risks and benefits explained, specific risks discussed. Consent was given by the patient. Immediately prior to procedure a time out was called to verify the correct patient, procedure, equipment, support staff and site/side marked as required. Patient was prepped and draped in the usual sterile fashion.       I personally saw and evaluated the patient, and participated in the management and treatment plan.  Sharrell Deck, PA-C Orthopedics  This document was dictated using Conservation officer, historic buildings. A reasonable attempt at proof reading has been made to minimize errors.

## 2023-12-12 ENCOUNTER — Encounter (HOSPITAL_COMMUNITY): Payer: Self-pay

## 2023-12-13 ENCOUNTER — Ambulatory Visit (HOSPITAL_BASED_OUTPATIENT_CLINIC_OR_DEPARTMENT_OTHER)

## 2023-12-13 ENCOUNTER — Encounter (HOSPITAL_BASED_OUTPATIENT_CLINIC_OR_DEPARTMENT_OTHER): Payer: Self-pay

## 2023-12-13 DIAGNOSIS — I480 Paroxysmal atrial fibrillation: Secondary | ICD-10-CM

## 2023-12-13 DIAGNOSIS — I10 Essential (primary) hypertension: Secondary | ICD-10-CM | POA: Diagnosis not present

## 2023-12-13 LAB — ECHOCARDIOGRAM COMPLETE
Area-P 1/2: 4.21 cm2
S' Lateral: 3.5 cm

## 2023-12-20 ENCOUNTER — Encounter: Payer: Self-pay | Admitting: Dermatology

## 2023-12-20 ENCOUNTER — Ambulatory Visit: Admitting: Dermatology

## 2023-12-20 VITALS — BP 134/80 | HR 88

## 2023-12-20 DIAGNOSIS — D099 Carcinoma in situ, unspecified: Secondary | ICD-10-CM

## 2023-12-20 DIAGNOSIS — C4492 Squamous cell carcinoma of skin, unspecified: Secondary | ICD-10-CM

## 2023-12-20 DIAGNOSIS — Z85828 Personal history of other malignant neoplasm of skin: Secondary | ICD-10-CM

## 2023-12-20 DIAGNOSIS — W908XXD Exposure to other nonionizing radiation, subsequent encounter: Secondary | ICD-10-CM | POA: Diagnosis not present

## 2023-12-20 DIAGNOSIS — D04122 Carcinoma in situ of skin of left lower eyelid, including canthus: Secondary | ICD-10-CM | POA: Diagnosis not present

## 2023-12-20 DIAGNOSIS — L539 Erythematous condition, unspecified: Secondary | ICD-10-CM | POA: Diagnosis not present

## 2023-12-20 DIAGNOSIS — L570 Actinic keratosis: Secondary | ICD-10-CM | POA: Diagnosis not present

## 2023-12-20 DIAGNOSIS — L905 Scar conditions and fibrosis of skin: Secondary | ICD-10-CM

## 2023-12-20 NOTE — Progress Notes (Signed)
   Follow Up Visit   Subjective  Victor Medina is a 78 y.o. male who presents for the following: follow up from Mohs surgery   The patient presents for follow up from Mohs surgery for a SCC on the right cheek, treated on 10/18/23, repaired with linear repair. The patient has been bandaging the wound as directed. The endorse the following concerns: No questions or concerns at this time.   The following portions of the chart were reviewed this encounter and updated as appropriate: medications, allergies, medical history  Review of Systems:  No other skin or systemic complaints except as noted in HPI or Assessment and Plan.  Objective  Well appearing patient in no apparent distress; mood and affect are within normal limits.  A full examination was performed including scalp, head, and face. All findings within normal limits unless otherwise noted below.  Healing wound with mild erythema  Relevant physical exam findings are noted in the Assessment and Plan.     Assessment & Plan    Healing s/p Mohs for [facility], treated on 10/18/23, repaired with linear repair - Reassured that wound is healing well - Discussed that scars take up to 12 months to mature from the date of surgery - Recommend SPF 30+ to scar daily to prevent purple color from UV exposure during scar maturation process - Discussed that erythema and raised appearance of scar will fade over the next 4-6 months - OK to start scar massage at 4-6 weeks post-op - Can consider silicone based products for scar healing starting at 6 weeks post-op  SCCIS IN ACTINIC KERATOSIS Exam: Erythematous thin papules/macules with gritty scale at the left lower eyelid- cleared s/p topical 5FU  Actinic keratoses are precancerous spots that appear secondary to cumulative UV radiation exposure/sun exposure over time. They are chronic with expected duration over 1 year. A portion of actinic keratoses will progress to squamous cell carcinoma of the skin.  It is not possible to reliably predict which spots will progress to skin cancer and so treatment is recommended to prevent development of skin cancer.  HISTORY OF SQUAMOUS CELL CARCINOMA OF THE SKIN - No evidence of recurrence today - No lymphadenopathy - Recommend regular full body skin exams - Recommend daily broad spectrum sunscreen SPF 30+ to sun-exposed areas, reapply every 2 hours as needed.  - Call if any new or changing lesions are noted between office visits  Return in about 6 months (around 06/21/2024) for SKIN CHECK .  I, Haig Levan, Surg Tech III, am acting as scribe for Deneise Finlay, MD.   Documentation: I have reviewed the above documentation for accuracy and completeness, and I agree with the above.  Deneise Finlay, MD

## 2023-12-20 NOTE — Patient Instructions (Signed)

## 2024-01-03 ENCOUNTER — Ambulatory Visit (INDEPENDENT_AMBULATORY_CARE_PROVIDER_SITE_OTHER): Payer: Medicare HMO | Admitting: Family Medicine

## 2024-01-03 ENCOUNTER — Encounter (HOSPITAL_BASED_OUTPATIENT_CLINIC_OR_DEPARTMENT_OTHER): Payer: Self-pay | Admitting: Family Medicine

## 2024-01-03 VITALS — BP 134/69 | HR 70 | Ht 73.0 in | Wt 317.1 lb

## 2024-01-03 DIAGNOSIS — I10 Essential (primary) hypertension: Secondary | ICD-10-CM | POA: Diagnosis not present

## 2024-01-03 DIAGNOSIS — I480 Paroxysmal atrial fibrillation: Secondary | ICD-10-CM | POA: Diagnosis not present

## 2024-01-03 NOTE — Assessment & Plan Note (Signed)
Blood pressure appropriate in office today.  No current concerns related chest pain or headaches.  Continues to follow with cardiology.  Has been taking medications as prescribed by cardiology Recommend continue with current medications, no changes to be made today Recommend intermittent monitoring of blood pressure at home, DASH diet

## 2024-01-03 NOTE — Progress Notes (Signed)
    Procedures performed today:    None.  Independent interpretation of notes and tests performed by another provider:   None.  Brief History, Exam, Impression, and Recommendations:    BP 134/69   Pulse 70   Ht 6\' 1"  (1.854 m)   Wt (!) 317 lb 1.6 oz (143.8 kg)   SpO2 99%   BMI 41.84 kg/m   Primary hypertension Assessment & Plan: Blood pressure appropriate in office today.  No current concerns related chest pain or headaches.  Continues to follow with cardiology.  Has been taking medications as prescribed by cardiology Recommend continue with current medications, no changes to be made today Recommend intermittent monitoring of blood pressure at home, DASH diet   PAF (paroxysmal atrial fibrillation) (HCC) Assessment & Plan: Patient continues with Eliquis . He does continue with diltiazem , Multaq , metoprolol .  No recent issues with dizziness, lightheadedness, palpitations. Has some questions about medications. On exam today, patient in no acute distress, vital signs stable.  Cardiovascular exam with normal rate and regular rhythm. Patient in NSR in office today.  Recommend continuing with current medications. Answered questions today. Continue with upcoming appointment with Cardiology   Return in about 6 months (around 07/05/2024).   ___________________________________________ Victor Medina de [place], MD, ABFM, CAQSM Primary Care and Sports Medicine [facility]

## 2024-01-03 NOTE — Assessment & Plan Note (Addendum)
 Patient continues with Eliquis . He does continue with diltiazem , Multaq , metoprolol .  No recent issues with dizziness, lightheadedness, palpitations. Has some questions about medications. On exam today, patient in no acute distress, vital signs stable.  Cardiovascular exam with normal rate and regular rhythm. Patient in NSR in office today.  Recommend continuing with current medications. Answered questions today. Continue with upcoming appointment with Cardiology

## 2024-01-05 ENCOUNTER — Encounter: Payer: Self-pay | Admitting: Dermatology

## 2024-01-25 ENCOUNTER — Other Ambulatory Visit: Payer: Self-pay

## 2024-01-25 DIAGNOSIS — I5032 Chronic diastolic (congestive) heart failure: Secondary | ICD-10-CM

## 2024-01-25 MED ORDER — DILTIAZEM HCL ER COATED BEADS 120 MG PO CP24: 120.0000 mg | ORAL_CAPSULE | Freq: Two times a day (BID) | ORAL | 3 refills | Status: AC

## 2024-01-27 DIAGNOSIS — I480 Paroxysmal atrial fibrillation: Secondary | ICD-10-CM | POA: Diagnosis not present

## 2024-01-27 DIAGNOSIS — I10 Essential (primary) hypertension: Secondary | ICD-10-CM | POA: Diagnosis not present

## 2024-01-28 LAB — COMPREHENSIVE METABOLIC PANEL WITH GFR
ALT: 16 IU/L (ref 0–44)
AST: 15 IU/L (ref 0–40)
Albumin: 4.4 g/dL (ref 3.8–4.8)
Alkaline Phosphatase: 65 IU/L (ref 44–121)
BUN/Creatinine Ratio: 22 (ref 10–24)
BUN: 26 mg/dL (ref 8–27)
Bilirubin Total: 0.8 mg/dL (ref 0.0–1.2)
CO2: 22 mmol/L (ref 20–29)
Calcium: 9.8 mg/dL (ref 8.6–10.2)
Chloride: 101 mmol/L (ref 96–106)
Creatinine, Ser: 1.18 mg/dL (ref 0.76–1.27)
Globulin, Total: 1.8 g/dL (ref 1.5–4.5)
Glucose: 105 mg/dL — ABNORMAL HIGH (ref 70–99)
Potassium: 4.4 mmol/L (ref 3.5–5.2)
Sodium: 142 mmol/L (ref 134–144)
Total Protein: 6.2 g/dL (ref 6.0–8.5)
eGFR: 63 mL/min/{1.73_m2} (ref >59)

## 2024-01-28 LAB — LIPID PANEL
Chol/HDL Ratio: 5.2 ratio — ABNORMAL HIGH (ref 0.0–5.0)
Cholesterol, Total: 224 mg/dL — ABNORMAL HIGH (ref 100–199)
HDL: 43 mg/dL (ref >39)
LDL Chol Calc (NIH): 162 mg/dL — ABNORMAL HIGH (ref 0–99)
Triglycerides: 108 mg/dL (ref 0–149)
VLDL Cholesterol Cal: 19 mg/dL (ref 5–40)

## 2024-01-28 LAB — CBC
Hematocrit: 48.4 % (ref 37.5–51.0)
Hemoglobin: 16.1 g/dL (ref 13.0–17.7)
MCH: 28.8 pg (ref 26.6–33.0)
MCHC: 33.3 g/dL (ref 31.5–35.7)
MCV: 86 fL (ref 79–97)
Platelets: 265 10*3/uL (ref 150–450)
RBC: 5.6 x10E6/uL (ref 4.14–5.80)
RDW: 13.1 % (ref 11.6–15.4)
WBC: 5.3 10*3/uL (ref 3.4–10.8)

## 2024-01-30 ENCOUNTER — Ambulatory Visit: Payer: Self-pay | Admitting: Nurse Practitioner

## 2024-01-31 NOTE — Telephone Encounter (Signed)
 FYI

## 2024-02-15 ENCOUNTER — Encounter (HOSPITAL_BASED_OUTPATIENT_CLINIC_OR_DEPARTMENT_OTHER): Payer: Self-pay | Admitting: Family Medicine

## 2024-02-15 NOTE — Telephone Encounter (Signed)
 Spoke with patient scheduled mychart visit 7/10 to discuss referral

## 2024-02-16 ENCOUNTER — Encounter (HOSPITAL_BASED_OUTPATIENT_CLINIC_OR_DEPARTMENT_OTHER): Payer: Self-pay | Admitting: Family Medicine

## 2024-02-16 ENCOUNTER — Telehealth (HOSPITAL_BASED_OUTPATIENT_CLINIC_OR_DEPARTMENT_OTHER): Admitting: Family Medicine

## 2024-02-16 DIAGNOSIS — R350 Frequency of micturition: Secondary | ICD-10-CM | POA: Diagnosis not present

## 2024-02-16 DIAGNOSIS — N401 Enlarged prostate with lower urinary tract symptoms: Secondary | ICD-10-CM

## 2024-02-16 NOTE — Progress Notes (Signed)
   Virtual Visit   I connected with  Victor Medina  on 02/16/24 by telehealth and telephone and verified that I am speaking with the correct person using two identifiers. Visit completed via video and audio.  Greater than 50% of visit was conducted via audio/telephone due to technical issues patient was having with video.  I discussed the limitations, risks, security and privacy concerns of performing an evaluation and management service by telephone, including the higher likelihood of inaccurate diagnosis and treatment, and the availability of in person appointments.  We also discussed the likely need of an additional face to face encounter for complete and high quality delivery of care.  I also discussed with the patient that there may be a patient responsible charge related to this service. The patient expressed understanding and wishes to proceed.  Provider location is in medical facility. Patient location is at their home, different from provider location. People involved in care of the patient during this telehealth encounter were myself, my nurse/medical assistant, and my front office/scheduling team member.  Review of Systems: No fevers, chills, night sweats, weight loss, chest pain, or shortness of breath.   Objective Findings:    General: Speaking full sentences, no audible heavy breathing.  Sounds alert and appropriately interactive.    Independent interpretation of tests performed by another provider:   None.  Brief History, Exam, Impression, and Recommendations:    Benign prostatic hyperplasia with urinary frequency Patient has been utilizing tamsulosin  for urinary symptoms related to BPH for a number of years, however he feels that medication has been largely effective for him.  He does not have any new issues such as fever, chills, dysuria.  Has not noted any hematuria.  We discussed options and patient would be amenable to titrating dose of tamsulosin .  He indicates that  he has a large supply of medication at home and will begin taking 2 pills at a time.  He would also like to proceed with referral to urology which is reasonable, referral placed today  I discussed the above assessment and treatment plan with the patient. The patient was provided an opportunity to ask questions and all were answered. The patient agreed with the plan and demonstrated an understanding of the instructions.  The patient was advised to call back or seek an in-person evaluation if the symptoms worsen or if the condition fails to improve as anticipated.  I provided 12 minutes of face to face and non-face-to-face time during this encounter date, time was needed to gather information, review chart, records, communicate/coordinate with staff remotely, as well as complete documentation.   ___________________________________________ Charne Mcbrien de [place], MD, ABFM, CAQSM Primary Care and Sports Medicine [facility]

## 2024-02-16 NOTE — Assessment & Plan Note (Signed)
 Patient has been utilizing tamsulosin  for urinary symptoms related to BPH for a number of years, however he feels that medication has been largely effective for him.  He does not have any new issues such as fever, chills, dysuria.  Has not noted any hematuria.  We discussed options and patient would be amenable to titrating dose of tamsulosin .  He indicates that he has a large supply of medication at home and will begin taking 2 pills at a time.  He would also like to proceed with referral to urology which is reasonable, referral placed today

## 2024-02-23 ENCOUNTER — Encounter (HOSPITAL_COMMUNITY): Payer: Self-pay | Admitting: *Deleted

## 2024-02-28 ENCOUNTER — Encounter (HOSPITAL_BASED_OUTPATIENT_CLINIC_OR_DEPARTMENT_OTHER): Payer: Self-pay

## 2024-03-10 ENCOUNTER — Encounter (HOSPITAL_BASED_OUTPATIENT_CLINIC_OR_DEPARTMENT_OTHER): Payer: Self-pay

## 2024-03-22 ENCOUNTER — Ambulatory Visit (INDEPENDENT_AMBULATORY_CARE_PROVIDER_SITE_OTHER): Admitting: Orthopaedic Surgery

## 2024-03-22 ENCOUNTER — Other Ambulatory Visit (HOSPITAL_BASED_OUTPATIENT_CLINIC_OR_DEPARTMENT_OTHER): Payer: Self-pay

## 2024-03-22 ENCOUNTER — Encounter (HOSPITAL_BASED_OUTPATIENT_CLINIC_OR_DEPARTMENT_OTHER): Payer: Self-pay | Admitting: Family Medicine

## 2024-03-22 DIAGNOSIS — M1712 Unilateral primary osteoarthritis, left knee: Secondary | ICD-10-CM

## 2024-03-22 DIAGNOSIS — I10 Essential (primary) hypertension: Secondary | ICD-10-CM

## 2024-03-22 DIAGNOSIS — M1711 Unilateral primary osteoarthritis, right knee: Secondary | ICD-10-CM

## 2024-03-22 DIAGNOSIS — I5032 Chronic diastolic (congestive) heart failure: Secondary | ICD-10-CM

## 2024-03-22 DIAGNOSIS — E7800 Pure hypercholesterolemia, unspecified: Secondary | ICD-10-CM

## 2024-03-22 MED ORDER — TRIAMCINOLONE ACETONIDE 40 MG/ML IJ SUSP
80.0000 mg | INTRAMUSCULAR | Status: AC | PRN
  Administered 2024-03-22: 80 mg via INTRA_ARTICULAR

## 2024-03-22 MED ORDER — LIDOCAINE HCL 1 % IJ SOLN
4.0000 mL | INTRAMUSCULAR | Status: AC | PRN
  Administered 2024-03-22: 4 mL

## 2024-03-22 MED ORDER — TAMSULOSIN HCL 0.4 MG PO CAPS: 0.8000 mg | ORAL_CAPSULE | Freq: Every day | ORAL | 1 refills | Status: AC

## 2024-03-22 NOTE — Telephone Encounter (Signed)
 Please see mychart message sent by pt and advise.

## 2024-03-22 NOTE — Progress Notes (Signed)
 Chief Complaint: Bilateral knee pain    History of Present Illness:   03/22/2024: Presents today for bilateral knee injections while he is continuing to lose weight in preparation for knee arthroplasty  Victor Medina is a 78 y.o. male right-hand-dominant male presents with right shoulder pain which has been ongoing now for nearly 10 years.  He does have a history of contralateral rotator cuff tear and repair.  This is done 10 years ago agrees with orthopedics.  He states that he has having progressive weakness and inability to lift anything overhead with any form of strength.  He is taking ibuprofen.  He has been working on a home strengthening routine at the gym without any improvements.  He has noted decrease overhead press strength.  He has been taking ibuprofen for the shoulder without any relief.    Surgical History:   none  PMH/PSH/Family History/Social History/Meds/Allergies:    Past Medical History:  Diagnosis Date  . Acquired dilation of ascending aorta and aortic root (HCC)    41 mm and 40 mm respectively  . Arthritis   . Asthma   . Cancer (HCC)    mild skin  . Chronic diastolic CHF (congestive heart failure) (HCC)   . GERD (gastroesophageal reflux disease)   . Hypertension   . Morbid obesity (HCC)   . OSA (obstructive sleep apnea)   . PAF (paroxysmal atrial fibrillation) (HCC) 08/14/2015   s/p TEE/DCCV to NSR;  denies palplita   . RBBB    atrial fib on cardizem   Past Surgical History:  Procedure Laterality Date  . ANTERIOR CERVICAL DECOMP/DISCECTOMY FUSION N/A 01/13/2023   Procedure: CERVICAL 4- CERVICAL 5, CERVICAL 5- CERVICAL 6 ANTERIOR CERVICAL DECOMPRESSION FUSION WITH INSTRUMENTATION AND ALLOGRAFT;  Surgeon: Beuford Anes, MD;  Location: [facility] OR;  Service: Orthopedics;  Laterality: N/A;  . CARDIOVERSION N/A 05/19/2015   Procedure: CARDIOVERSION;  Surgeon: Vina Okey GAILS, MD;  Location: [facility] ENDOSCOPY;  Service: Cardiovascular;   Laterality: N/A;  . CARDIOVERSION N/A 12/17/2021   Procedure: CARDIOVERSION;  Surgeon: Okey Vina GAILS, MD;  Location: [facility] ENDOSCOPY;  Service: Cardiovascular;  Laterality: N/A;  . CATARACT EXTRACTION W/ INTRAOCULAR LENS IMPLANT Right   . CHOLECYSTECTOMY N/A 12/01/2012   Procedure: LAPAROSCOPIC CHOLECYSTECTOMY;  Surgeon: Lynwood MALVA Pina, MD;  Location: [facility] OR;  Service: General;  Laterality: N/A;  . COLONOSCOPY WITH PROPOFOL N/A 07/13/2019   Procedure: COLONOSCOPY WITH PROPOFOL;  Surgeon: Rollin Dover, MD;  Location: [facility] ENDOSCOPY;  Service: Endoscopy;  Laterality: N/A;  . MASS EXCISION Right 02/20/2018   Procedure: RIGHT THUMB EXCISION MASS AND DEBRIDEMENT DISTAL INTRPHLANGEAL JOINT;  Surgeon: Murrell Drivers, MD;  Location: [facility];  Service: Orthopedics;  Laterality: Right;  . POLYPECTOMY  07/13/2019   Procedure: POLYPECTOMY;  Surgeon: Rollin Dover, MD;  Location: [facility] ENDOSCOPY;  Service: Endoscopy;;  . right knee     meniscus removal 6 years ago  . SHOULDER SURGERY Left    Rotator Cuff Repair  . TEE WITHOUT CARDIOVERSION N/A 05/19/2015   Procedure: TRANSESOPHAGEAL ECHOCARDIOGRAM (TEE);  Surgeon: Vina Okey GAILS, MD;  Location: [facility] ENDOSCOPY;  Service: Cardiovascular;  Laterality: N/A;   Social History   Socioeconomic History  . Marital status: Married    Spouse name: Not on file  . Number of children: Not on file  . Years  of education: Not on file  . Highest education level: Associate degree: occupational, Scientist, product/process development, or vocational program  Occupational History  . Not on file  Tobacco Use  . Smoking status: Former    Types: Cigarettes    Passive exposure: Past  . Smokeless tobacco: Never  . Tobacco comments:    Quit 45 years ago as of 2024  Vaping Use  . Vaping status: Never Used  Substance and Sexual Activity  . Alcohol use: Not Currently  . Drug use: No  . Sexual activity: Never  Other Topics Concern  . Not on file  Social History Narrative  . Not on file    Social Drivers of Health   Financial Resource Strain: Low Risk  (09/01/2023)   Overall Financial Resource Strain (CARDIA)   . Difficulty of Paying Living Expenses: Not hard at all  Food Insecurity: No Food Insecurity (09/01/2023)   Hunger Vital Sign   . Worried About Programme researcher, broadcasting/film/video in the Last Year: Never true   . Ran Out of Food in the Last Year: Never true  Transportation Needs: No Transportation Needs (09/01/2023)   PRAPARE - Transportation   . Lack of Transportation (Medical): No   . Lack of Transportation (Non-Medical): No  Physical Activity: Sufficiently Active (09/01/2023)   Exercise Vital Sign   . Days of Exercise per Week: 3 days   . Minutes of Exercise per Session: 60 min  Stress: No Stress Concern Present (09/01/2023)   [employer] of Occupational Health - Occupational Stress Questionnaire   . Feeling of Stress : Not at all  Social Connections: Unknown (09/21/2023)   Social Connection and Isolation Panel   . Frequency of Communication with Friends and Family: Patient declined   . Frequency of Social Gatherings with Friends and Family: Patient declined   . Attends Religious Services: Never   . Active Member of Clubs or Organizations: No   . Attends Banker Meetings: Never   . Marital Status: Married   Family History  Problem Relation Age of Onset  . Hypotension Mother   . Heart disease Sister    No Known Allergies  Current Outpatient Medications  Medication Sig Dispense Refill  . acetaminophen (TYLENOL) 500 MG tablet Take 1,000 mg by mouth every 6 (six) hours as needed for moderate pain.    SABRA apixaban (ELIQUIS) 5 MG TABS tablet Take 5 mg by mouth 2 (two) times daily.    . APPLE CIDER VINEGAR PO Take 2 capsules by mouth daily.    . Cholecalciferol (VITAMIN D3) 2000 UNITS TABS Take 4,000 Units by mouth daily.     SABRA diltiazem (CARDIZEM CD) 120 MG 24 hr capsule Take 1 capsule (120 mg total) by mouth 2 (two) times daily. 180 capsule 3  .  dronedarone (MULTAQ) 400 MG tablet Take 1 tablet (400 mg total) by mouth 2 (two) times daily with a meal. 30 tablet 0  . fluticasone (FLONASE) 50 MCG/ACT nasal spray Place 1 spray into both nostrils at bedtime.    . furosemide (LASIX) 20 MG tablet Take 1 tablet (20 mg total) by mouth daily. Take 2 tabs daily for 3 days, then reduce dose to one tablet daily. 90 tablet 3  . hydrochlorothiazide (HYDRODIURIL) 25 MG tablet Take 1 tablet (25 mg total) by mouth daily. 90 tablet 3  . loratadine (CLARITIN) 10 MG tablet Take 10 mg by mouth daily.    . Magnesium 250 MG TABS Take 250 mg by mouth 2 (two) times  daily.    . methocarbamol (ROBAXIN) 500 MG tablet Take 500 mg by mouth as needed for muscle spasms.    . metoprolol tartrate (LOPRESSOR) 25 MG tablet TAKE 1 AND 1/2 TABLETS BY MOUTH TWICE DAILY 270 tablet 1  . Multiple Vitamins-Minerals (MULTIVITAMIN ADULT) CHEW Chew 2 each by mouth daily.    . tamsulosin (FLOMAX) 0.4 MG CAPS capsule TAKE 1 CAPSULE BY MOUTH EVERYDAY AT BEDTIME 90 capsule 0  . trolamine salicylate (ASPERCREME) 10 % cream Apply 1 application topically as needed for muscle pain.     No current facility-administered medications for this visit.   No results found.  Review of Systems:   A ROS was performed including pertinent positives and negatives as documented in the HPI.  Physical Exam :   Constitutional: NAD and appears stated age Neurological: Alert and oriented Psych: Appropriate affect and cooperative There were no vitals taken for this visit.   Comprehensive Musculoskeletal Exam:    Musculoskeletal Exam    Inspection Right Left  Skin No atrophy or winging No atrophy or winging  Palpation    Tenderness Lateral deltoid None  Range of Motion    Flexion (passive) 170 170  Flexion (active) 170 170  Abduction 170 170  ER at the side 70 70  Can reach behind back to T12 T12  Strength     4/5 s supraspinatus, negative belly press 5/5  Special Tests    Pseudoparalytic No  No  Neurologic    Fires PIN, radial, median, ulnar, musculocutaneous, axillary, suprascapular, long thoracic, and spinal accessory innervated muscles. No abnormal sensibility  Vascular/Lymphatic    Radial Pulse 2+ 2+  Cervical Exam    Patient has symmetric cervical range of motion with negative Spurling's test.  Special Test:      Imaging:   Xray (3 views right shoulder, 3 views left shoulder): Normal with evidence of previous distal clavicle injury on the right  MRI right shoulder; Small undersurface tear of the right shoulder supraspinatus tendon with fluid around the biceps consistent with biceps tendinitis.  I personally reviewed and interpreted the radiographs.   Assessment:   78 y.o. male with right shoulder pain ongoing now for approximately 10 years.  At this time he has tried multiple modalities including activity restriction and ibuprofen.  I did discuss that he does have a small tear on the undersurface of his rotator cuff.  That effect I would recommend an injection of the right shoulder today and plan to enroll him in physical therapy for strengthening of the rotator cuff.  Would like him to begin with this as I did discuss given the fact that his tear is overall extremely small there is a high chance of him progressing without surgical intervention Plan :    -Right ultrasound-guided injection performed today     Procedure Note  Patient: Victor Medina             Date of Birth: [birth year]           MRN: [id number]             Visit Date: 03/22/2024  Procedures: Visit Diagnoses:  1. Unilateral primary osteoarthritis, left knee   2. Unilateral primary osteoarthritis, right knee     Large Joint Inj: R knee on 03/22/2024 12:13 PM Indications: pain Details: 22 G 1.5 in needle, ultrasound-guided anterior approach  Arthrogram: No  Medications: 4 mL lidocaine 1 %; 80 mg triamcinolone acetonide 40 MG/ML Outcome: tolerated well, no immediate  complications Procedure, treatment alternatives, risks and benefits explained, specific risks discussed. Consent was given by the patient. Immediately prior to procedure a time out was called to verify the correct patient, procedure, equipment, support staff and site/side marked as required. Patient was prepped and draped in the usual sterile fashion.    Large Joint Inj: L knee on 03/22/2024 12:14 PM Indications: pain Details: 22 G 1.5 in needle, ultrasound-guided anterior approach  Arthrogram: No  Medications: 4 mL lidocaine 1 %; 80 mg triamcinolone acetonide 40 MG/ML Outcome: tolerated well, no immediate complications Procedure, treatment alternatives, risks and benefits explained, specific risks discussed. Consent was given by the patient. Immediately prior to procedure a time out was called to verify the correct patient, procedure, equipment, support staff and site/side marked as required. Patient was prepped and draped in the usual sterile fashion.        I personally saw and evaluated the patient, and participated in the management and treatment plan.  Elspeth Parker, MD Attending Physician, Orthopedic Surgery  This document was dictated using Dragon voice recognition software. A reasonable attempt at proof reading has been made to minimize errors.

## 2024-03-23 ENCOUNTER — Other Ambulatory Visit: Payer: Self-pay | Admitting: Medical Genetics

## 2024-04-03 ENCOUNTER — Ambulatory Visit (HOSPITAL_BASED_OUTPATIENT_CLINIC_OR_DEPARTMENT_OTHER): Payer: Medicare HMO

## 2024-04-04 ENCOUNTER — Other Ambulatory Visit: Payer: Self-pay | Admitting: Cardiology

## 2024-04-28 ENCOUNTER — Other Ambulatory Visit: Payer: Self-pay | Admitting: Family Medicine

## 2024-05-10 ENCOUNTER — Encounter (HOSPITAL_BASED_OUTPATIENT_CLINIC_OR_DEPARTMENT_OTHER): Payer: Self-pay | Admitting: Family Medicine

## 2024-05-15 ENCOUNTER — Encounter (HOSPITAL_BASED_OUTPATIENT_CLINIC_OR_DEPARTMENT_OTHER): Payer: Self-pay

## 2024-05-15 DIAGNOSIS — N401 Enlarged prostate with lower urinary tract symptoms: Secondary | ICD-10-CM | POA: Diagnosis not present

## 2024-05-15 DIAGNOSIS — N139 Obstructive and reflux uropathy, unspecified: Secondary | ICD-10-CM | POA: Diagnosis not present

## 2024-05-15 DIAGNOSIS — R351 Nocturia: Secondary | ICD-10-CM | POA: Diagnosis not present

## 2024-05-15 DIAGNOSIS — R350 Frequency of micturition: Secondary | ICD-10-CM | POA: Diagnosis not present

## 2024-05-15 DIAGNOSIS — R3912 Poor urinary stream: Secondary | ICD-10-CM | POA: Diagnosis not present

## 2024-05-27 ENCOUNTER — Encounter (HOSPITAL_BASED_OUTPATIENT_CLINIC_OR_DEPARTMENT_OTHER): Payer: Self-pay | Admitting: Orthopaedic Surgery

## 2024-06-07 ENCOUNTER — Other Ambulatory Visit: Payer: Self-pay | Admitting: Medical Genetics

## 2024-06-07 DIAGNOSIS — Z006 Encounter for examination for normal comparison and control in clinical research program: Secondary | ICD-10-CM

## 2024-06-11 ENCOUNTER — Encounter: Payer: Self-pay | Admitting: Radiology

## 2024-06-21 ENCOUNTER — Ambulatory Visit: Admitting: Dermatology

## 2024-06-25 ENCOUNTER — Ambulatory Visit (HOSPITAL_BASED_OUTPATIENT_CLINIC_OR_DEPARTMENT_OTHER): Admitting: Student

## 2024-06-25 ENCOUNTER — Ambulatory Visit (INDEPENDENT_AMBULATORY_CARE_PROVIDER_SITE_OTHER): Admitting: Family Medicine

## 2024-06-25 ENCOUNTER — Encounter (HOSPITAL_BASED_OUTPATIENT_CLINIC_OR_DEPARTMENT_OTHER): Payer: Self-pay | Admitting: Family Medicine

## 2024-06-25 VITALS — BP 119/76 | HR 70 | Temp 98.3°F | Resp 16 | Wt 303.0 lb

## 2024-06-25 DIAGNOSIS — H6690 Otitis media, unspecified, unspecified ear: Secondary | ICD-10-CM | POA: Diagnosis not present

## 2024-06-25 MED ORDER — AMOXICILLIN-POT CLAVULANATE 875-125 MG PO TABS: 1.0000 | ORAL_TABLET | Freq: Two times a day (BID) | ORAL | 0 refills | Status: DC

## 2024-06-25 NOTE — Progress Notes (Signed)
    Procedures performed today:    None.  Independent interpretation of notes and tests performed by another provider:   None.  Brief History, Exam, Impression, and Recommendations:    BP 119/76 (Cuff Size: Large)   Pulse 70   Temp 98.3 F (36.8 C) (Oral)   Resp 16   Wt (!) 303 lb (137.4 kg)   SpO2 97%   BMI 39.98 kg/m   Discussed the use of AI scribe software for clinical note transcription with the patient, who gave verbal consent to proceed.  History of Present Illness Jaysten Essner is a 78 year old male who presents with right ear pain and congestion.  He has been experiencing right ear pain and congestion for two days, which began on Thursday. The pain is described as aching, and he mentions a peculiar sensation involving his hair. He has a history of ear infections and uses a bone conduction hearing aid due to intolerance to traditional hearing aids. The left ear is unaffected.  He experiences some congestion when coughing but denies shortness of breath. He has not used any over-the-counter medications for his symptoms. This is the first time he has been sick all year.  He has a history of knee issues and is scheduled for knee injections the following day. He has managed to reduce his weight to 295 pounds, with a goal of reaching 280 pounds, but notes difficulty due to low metabolism.  He stopped using an inhaler about a year ago and reports improvement in urinary frequency after visiting a urologist, now waking up two to three times a night instead of six.  No shortness of breath or allergies to antibiotics.  On exam, patient is no acute distress, vital signs stable.  Cardiovascular exam with regular rate and rhythm, lungs clear to auscultation bilaterally.  Right tympanic membrane with mild to moderate erythema along margins, slightly opaque appearance of tympanic membrane, mild bulging.  Left tympanic membrane is normal in appearance.  Acute otitis media,  unspecified otitis media type Assessment & Plan: Acute otitis media in the right ear with eardrum redness, ear pain, and congestion. No systemic symptoms or antibiotic allergies. - Prescribed Augmentin  for 7 days, twice daily with food. - Sent prescription to pharmacy. - Advised to report if symptoms worsen or persist.   Other orders -     Amoxicillin -Pot Clavulanate; Take 1 tablet by mouth 2 (two) times daily.  Dispense: 14 tablet; Refill: 0  Return if symptoms worsen or fail to improve.   ___________________________________________ Yuta Cipollone de [place], MD, ABFM, CAQSM Primary Care and Sports Medicine [facility]

## 2024-06-25 NOTE — Assessment & Plan Note (Signed)
 Acute otitis media in the right ear with eardrum redness, ear pain, and congestion. No systemic symptoms or antibiotic allergies. - Prescribed Augmentin  for 7 days, twice daily with food. - Sent prescription to pharmacy. - Advised to report if symptoms worsen or persist.

## 2024-06-26 ENCOUNTER — Ambulatory Visit (HOSPITAL_BASED_OUTPATIENT_CLINIC_OR_DEPARTMENT_OTHER): Admitting: Student

## 2024-06-26 DIAGNOSIS — M170 Bilateral primary osteoarthritis of knee: Secondary | ICD-10-CM | POA: Diagnosis not present

## 2024-06-26 MED ORDER — TRIAMCINOLONE ACETONIDE 40 MG/ML IJ SUSP
2.0000 mL | INTRAMUSCULAR | Status: AC | PRN
  Administered 2024-06-26: 2 mL via INTRA_ARTICULAR

## 2024-06-26 MED ORDER — LIDOCAINE HCL 1 % IJ SOLN
4.0000 mL | INTRAMUSCULAR | Status: AC | PRN
  Administered 2024-06-26: 4 mL

## 2024-06-26 NOTE — Progress Notes (Signed)
 Chief Complaint: Bilateral knee pain     History of Present Illness:   Victor Medina is a 78 y.o. male who presents today for follow-up of bilateral knee pain.  Currently his left knee is slightly more bothersome than the right.  He was last seen in clinic on 8/14 and received bilateral cortisone injections at that time which have helped him until recently.  He is continuing to work on weight loss and states that he is now just under 300 pounds.  Symptoms are interfering with his daily activities and ability to be on his feet for long periods of time.   Surgical History:   None  PMH/PSH/Family History/Social History/Meds/Allergies:    Past Medical History:  Diagnosis Date   Acquired dilation of ascending aorta and aortic root    41 mm and 40 mm respectively   Arthritis    Asthma    Cancer (HCC)    mild skin   Chronic diastolic CHF (congestive heart failure) (HCC)    GERD (gastroesophageal reflux disease)    Hypertension    Morbid obesity (HCC)    OSA (obstructive sleep apnea)    PAF (paroxysmal atrial fibrillation) (HCC) 08/14/2015   s/p TEE/DCCV to NSR;  denies palplita    RBBB    atrial fib on cardizem    Past Surgical History:  Procedure Laterality Date   ANTERIOR CERVICAL DECOMP/DISCECTOMY FUSION N/A 01/13/2023   Procedure: CERVICAL 4- CERVICAL 5, CERVICAL 5- CERVICAL 6 ANTERIOR CERVICAL DECOMPRESSION FUSION WITH INSTRUMENTATION AND ALLOGRAFT;  Surgeon: Beuford Anes, MD;  Location: [facility] OR;  Service: Orthopedics;  Laterality: N/A;   CARDIOVERSION N/A 05/19/2015   Procedure: CARDIOVERSION;  Surgeon: Vina Okey GAILS, MD;  Location: [facility] ENDOSCOPY;  Service: Cardiovascular;  Laterality: N/A;   CARDIOVERSION N/A 12/17/2021   Procedure: CARDIOVERSION;  Surgeon: Okey Vina GAILS, MD;  Location: [facility] ENDOSCOPY;  Service: Cardiovascular;  Laterality: N/A;   CATARACT EXTRACTION W/ INTRAOCULAR LENS IMPLANT Right    CHOLECYSTECTOMY N/A 12/01/2012    Procedure: LAPAROSCOPIC CHOLECYSTECTOMY;  Surgeon: Lynwood MALVA Pina, MD;  Location: [facility] OR;  Service: General;  Laterality: N/A;   COLONOSCOPY WITH PROPOFOL  N/A 07/13/2019   Procedure: COLONOSCOPY WITH PROPOFOL ;  Surgeon: Rollin Dover, MD;  Location: [facility] ENDOSCOPY;  Service: Endoscopy;  Laterality: N/A;   MASS EXCISION Right 02/20/2018   Procedure: RIGHT THUMB EXCISION MASS AND DEBRIDEMENT DISTAL INTRPHLANGEAL JOINT;  Surgeon: Murrell Drivers, MD;  Location: [facility];  Service: Orthopedics;  Laterality: Right;   POLYPECTOMY  07/13/2019   Procedure: POLYPECTOMY;  Surgeon: Rollin Dover, MD;  Location: [facility] ENDOSCOPY;  Service: Endoscopy;;   right knee     meniscus removal 6 years ago   SHOULDER SURGERY Left    Rotator Cuff Repair   TEE WITHOUT CARDIOVERSION N/A 05/19/2015   Procedure: TRANSESOPHAGEAL ECHOCARDIOGRAM (TEE);  Surgeon: Vina Okey GAILS, MD;  Location: [facility] ENDOSCOPY;  Service: Cardiovascular;  Laterality: N/A;   Social History   Socioeconomic History   Marital status: Married    Spouse name: Not on file   Number of children: Not on file   Years of education: Not on file   Highest education level: Associate degree: occupational, scientist, product/process development, or vocational program  Occupational History   Not on file  Tobacco Use   Smoking status: Former    Types: Cigarettes  Passive exposure: Past   Smokeless tobacco: Never   Tobacco comments:    Quit 45 years ago as of 2024  Vaping Use   Vaping status: Never Used  Substance and Sexual Activity   Alcohol use: Not Currently   Drug use: No   Sexual activity: Never  Other Topics Concern   Not on file  Social History Narrative   Not on file   Social Drivers of Health   Financial Resource Strain: Low Risk  (06/25/2024)   Overall Financial Resource Strain (CARDIA)    Difficulty of Paying Living Expenses: Not hard at all  Food Insecurity: No Food Insecurity (06/25/2024)   Hunger Vital Sign    Worried About Running Out of Food in  the Last Year: Never true    Ran Out of Food in the Last Year: Never true  Transportation Needs: No Transportation Needs (06/25/2024)   PRAPARE - Administrator, Civil Service (Medical): No    Lack of Transportation (Non-Medical): No  Physical Activity: Sufficiently Active (06/25/2024)   Exercise Vital Sign    Days of Exercise per Week: 3 days    Minutes of Exercise per Session: 60 min  Stress: No Stress Concern Present (06/25/2024)   [employer] of Occupational Health - Occupational Stress Questionnaire    Feeling of Stress: Not at all  Social Connections: Socially Isolated (06/25/2024)   Social Connection and Isolation Panel    Frequency of Communication with Friends and Family: Never    Frequency of Social Gatherings with Friends and Family: Never    Attends Religious Services: Never    Database Administrator or Organizations: No    Attends Engineer, Structural: Not on file    Marital Status: Married   Family History  Problem Relation Age of Onset   Hypotension Mother    Heart disease Sister    No Known Allergies  Current Outpatient Medications  Medication Sig Dispense Refill   acetaminophen  (TYLENOL ) 500 MG tablet Take 1,000 mg by mouth every 6 (six) hours as needed for moderate pain.     amoxicillin -clavulanate (AUGMENTIN ) 875-125 MG tablet Take 1 tablet by mouth 2 (two) times daily. 14 tablet 0   apixaban  (ELIQUIS ) 5 MG TABS tablet Take 5 mg by mouth 2 (two) times daily.     Cholecalciferol  (VITAMIN D3) 2000 UNITS TABS Take 4,000 Units by mouth daily.      diltiazem  (CARDIZEM  CD) 120 MG 24 hr capsule Take 1 capsule (120 mg total) by mouth 2 (two) times daily. 180 capsule 3   dronedarone  (MULTAQ ) 400 MG tablet Take 1 tablet (400 mg total) by mouth 2 (two) times daily with a meal. 30 tablet 0   fluticasone  (FLONASE ) 50 MCG/ACT nasal spray Place 1 spray into both nostrils at bedtime.     furosemide  (LASIX ) 20 MG tablet Take 1 tablet (20 mg total) by  mouth daily. Take 2 tabs daily for 3 days, then reduce dose to one tablet daily. 90 tablet 3   hydrochlorothiazide  (HYDRODIURIL ) 25 MG tablet TAKE 1 TABLET (25 MG TOTAL) BY MOUTH DAILY. 90 tablet 3   loratadine  (CLARITIN ) 10 MG tablet Take 10 mg by mouth daily.     Magnesium 250 MG TABS Take 250 mg by mouth 2 (two) times daily.     methocarbamol  (ROBAXIN ) 500 MG tablet Take 500 mg by mouth as needed for muscle spasms.     metoprolol  tartrate (LOPRESSOR ) 25 MG tablet TAKE 1 AND 1/2 TABLETS BY  MOUTH TWICE DAILY 270 tablet 1   Multiple Vitamins-Minerals (MULTIVITAMIN ADULT) CHEW Chew 2 each by mouth daily.     tamsulosin  (FLOMAX ) 0.4 MG CAPS capsule Take 2 capsules (0.8 mg total) by mouth daily. 180 capsule 1   trolamine salicylate (ASPERCREME) 10 % cream Apply 1 application topically as needed for muscle pain.     No current facility-administered medications for this visit.   No results found.  Review of Systems:   A ROS was performed including pertinent positives and negatives as documented in the HPI.  Physical Exam :   Constitutional: NAD and appears stated age Neurological: Alert and oriented Psych: Appropriate affect and cooperative There were no vitals taken for this visit.   Comprehensive Musculoskeletal Exam:    Exam of bilateral knees demonstrates active range of motion from approximately 0 to 110 degrees with palpable crepitus.  Minimal to mild effusions present without overlying erythema or warmth.  No collateral ligament laxity with varus or valgus stress.   Imaging:     Assessment:   78 y.o. male with advanced bilateral knee osteoarthritis particularly of the medial compartments which prior x-rays have demonstrated complete loss of joint space.  He is still continuing with weight loss in order to become eligible for total knee replacement and has been managing this with cortisone injections which have been providing 2-3 months of relief.  Repeat injections were performed  today into both knees.  Patient understands that we need to wait at least 3 months before repeating these again and he will continue to work on weight loss in the meantime.  Plan :    - Bilateral knee cortisone injections performed today and return to clinic as needed     Procedure Note  Patient: Victor Medina             Date of Birth: 12/14/1945           MRN: [id number]             Visit Date: 06/26/2024  Procedures: Visit Diagnoses:  1. Bilateral primary osteoarthritis of knee      Large Joint Inj: bilateral knee on 06/26/2024 12:30 PM Indications: pain Details: 22 G 1.5 in needle, ultrasound-guided anterolateral approach Medications (Right): 4 mL lidocaine  1 %; 2 mL triamcinolone  acetonide 40 MG/ML Medications (Left): 4 mL lidocaine  1 %; 2 mL triamcinolone  acetonide 40 MG/ML Outcome: tolerated well, no immediate complications Procedure, treatment alternatives, risks and benefits explained, specific risks discussed. Consent was given by the patient. Immediately prior to procedure a time out was called to verify the correct patient, procedure, equipment, support staff and site/side marked as required. Patient was prepped and draped in the usual sterile fashion.      I personally saw and evaluated the patient, and participated in the management and treatment plan.   Leonce Reveal, PA-C Orthopedics  This document was dictated using Conservation officer, historic buildings. A reasonable attempt at proof reading has been made to minimize errors.

## 2024-06-28 ENCOUNTER — Other Ambulatory Visit (HOSPITAL_BASED_OUTPATIENT_CLINIC_OR_DEPARTMENT_OTHER): Payer: Self-pay

## 2024-06-28 MED ORDER — COMIRNATY 30 MCG/0.3ML IM SUSY
0.3000 mL | PREFILLED_SYRINGE | Freq: Once | INTRAMUSCULAR | 0 refills | Status: AC
  Filled 2024-06-28: qty 0.3, 1d supply, fill #0

## 2024-06-28 MED ORDER — FLUZONE HIGH-DOSE 0.5 ML IM SUSY
0.5000 mL | PREFILLED_SYRINGE | Freq: Once | INTRAMUSCULAR | 0 refills | Status: AC
  Filled 2024-06-28: qty 0.5, 1d supply, fill #0

## 2024-07-07 ENCOUNTER — Encounter (HOSPITAL_BASED_OUTPATIENT_CLINIC_OR_DEPARTMENT_OTHER): Payer: Self-pay | Admitting: Family Medicine

## 2024-07-09 ENCOUNTER — Ambulatory Visit: Payer: Self-pay

## 2024-07-09 NOTE — Telephone Encounter (Signed)
 FYI Only or Action Required?: FYI only for provider: appointment scheduled on 07/10/24.  Patient was last seen in primary care on 06/25/2024 by de Cuba, Quintin PARAS, MD.  Called Nurse Triage reporting Facial Swelling.  Symptoms began several days ago.  Interventions attempted: Nothing.  Symptoms are: gradually worsening.  Triage Disposition: See Physician Within 24 Hours  Patient/caregiver understands and will follow disposition?: Yes                               1. ONSET: When did the swelling start? (e.g., minutes, hours, days)     Friday 2. LOCATION: What part of the face is swollen? (e.g., cheek, entire face, jaw joint area, under jaw)     Below right ear and down, along cheekbone  3. SEVERITY: How swollen is it?     Mild 4. ITCHING: Is there any itching? If Yes, ask: How much?   (Scale 1-10; mild, moderate or severe)     Denies 5. PAIN: Is the swelling painful to touch? If Yes, ask: How painful is it?   (Scale 0-10; mild, moderate or severe)     Rates pain 6-7, worsens upon applying pressure 6. FEVER: Do you have a fever? If Yes, ask: What is it, how was it measured, and when did it start?      Denies 7. CAUSE: What do you think is causing the face swelling?     Unsure, states he was recently treated for an ear infection in the right ear 8. NEW MEDICINES: Have there been any new medicines started recently?     Augmentin  10. OTHER SYMPTOMS: Do you have any other symptoms? (e.g., leg swelling, toothache)     Reports mild chest cold/sinus congestion   Denies difficulty breathing, denies chest pain, denies difficulty swallowing, denies redness, denies rash   Copied from CRM #[id number]. Topic: Clinical - Red Word Triage >> Jul 09, 2024 10:25 AM Suzen RAMAN wrote: Red Word that prompted transfer to Nurse Triage: face swelling below right ear; requesting an appt  Reason for Disposition  Face swelling is painful to  touch  Protocols used: Face Swelling-A-AH

## 2024-07-09 NOTE — Telephone Encounter (Signed)
 Pt scheduled for appt 12/2.

## 2024-07-10 ENCOUNTER — Encounter (HOSPITAL_BASED_OUTPATIENT_CLINIC_OR_DEPARTMENT_OTHER): Payer: Self-pay | Admitting: Family Medicine

## 2024-07-10 ENCOUNTER — Ambulatory Visit (INDEPENDENT_AMBULATORY_CARE_PROVIDER_SITE_OTHER): Admitting: Family Medicine

## 2024-07-10 VITALS — BP 146/83 | HR 71 | Ht 73.0 in | Wt 307.0 lb

## 2024-07-10 DIAGNOSIS — H6690 Otitis media, unspecified, unspecified ear: Secondary | ICD-10-CM | POA: Diagnosis not present

## 2024-07-10 MED ORDER — CEFDINIR 300 MG PO CAPS: 300.0000 mg | ORAL_CAPSULE | Freq: Two times a day (BID) | ORAL | 0 refills | Status: AC

## 2024-07-10 NOTE — Progress Notes (Signed)
 Acute Care Office Visit  Subjective:   Victor Medina 07-07-1946 07/10/2024  Chief Complaint  Patient presents with   Sinusitis    Pt states he had an ear infection and had some swelling and pain under his right ear which began about 5 days ago.    HPI: Patient following up due to right ear infection starting on 06/25/2024 and completion of Augmentin  abx. Reports he began having improvement but began having recurrence of right ear, swelling to right ear and lymphoadenopathy x 5 days . Reports mild tenderness to external ear and area to right jaw with palpation with mild swelling. Denies drainage, fever, dizziness, headache or nasal congestion.    The following portions of the patient's history were reviewed and updated as appropriate: past medical history, past surgical history, family history, social history, allergies, medications, and problem list.   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   Wellness examination 01/19/2022   Persistent atrial fibrillation (HCC) 11/27/2021   Bilateral knee pain 09/29/2021   Bilateral hearing loss 08/05/2020   Elevated serum glucose 11/13/2019   Hyperlipidemia 09/20/2018   Benign prostatic hyperplasia with urinary frequency 09/13/2018   Osteoarthritis of finger of right hand 03/07/2018   Morbid obesity with BMI of 45.0-49.9, adult (HCC) 07/29/2016   Hearing loss 07/28/2016   Asthma 07/27/2016   PAF (paroxysmal atrial fibrillation) (HCC) 08/14/2015   Acquired dilation of ascending aorta and aortic root 10/26/2013   Edema of extremities 10/26/2013   HTN (hypertension) 10/26/2013   Chronic diastolic CHF (congestive heart failure) (HCC)    RBBB    Obstructive sleep apnea 08/16/2013   Morbid obesity (HCC) 08/16/2013   Rotator cuff disorder 08/16/2013   Cholecystitis, acute s/p laparoscopic cholecystectomy  12/01/12 12/02/2012   Past Medical History:  Diagnosis Date   Acquired dilation of ascending aorta and aortic root    41 mm and 40 mm respectively   Arthritis    Asthma    Cancer (HCC)    mild skin   Chronic diastolic CHF (congestive heart failure) (HCC)    GERD (gastroesophageal reflux disease)    Hypertension    Morbid obesity (HCC)    OSA (obstructive sleep apnea)    PAF (paroxysmal atrial fibrillation) (HCC) 08/14/2015   s/p TEE/DCCV to NSR;  denies palplita    RBBB    atrial fib on cardizem    Past Surgical History:  Procedure Laterality Date   ANTERIOR CERVICAL DECOMP/DISCECTOMY FUSION N/A 01/13/2023   Procedure: CERVICAL 4- CERVICAL 5, CERVICAL 5- CERVICAL 6 ANTERIOR CERVICAL DECOMPRESSION FUSION WITH INSTRUMENTATION AND ALLOGRAFT;  Surgeon: Beuford Anes, MD;  Location: [facility] OR;  Service: Orthopedics;  Laterality: N/A;   CARDIOVERSION N/A 05/19/2015   Procedure: CARDIOVERSION;  Surgeon: Vina Okey GAILS, MD;  Location: [facility] ENDOSCOPY;  Service: Cardiovascular;  Laterality: N/A;   CARDIOVERSION N/A 12/17/2021   Procedure: CARDIOVERSION;  Surgeon: Okey Vina GAILS, MD;  Location: [facility] ENDOSCOPY;  Service: Cardiovascular;  Laterality: N/A;   CATARACT EXTRACTION W/ INTRAOCULAR LENS IMPLANT Right    CHOLECYSTECTOMY N/A 12/01/2012   Procedure: LAPAROSCOPIC CHOLECYSTECTOMY;  Surgeon: Lynwood MALVA Pina, MD;  Location: [facility] OR;  Service: General;  Laterality: N/A;   COLONOSCOPY WITH PROPOFOL  N/A 07/13/2019   Procedure: COLONOSCOPY WITH PROPOFOL ;  Surgeon: Rollin Dover, MD;  Location: [facility] ENDOSCOPY;  Service: Endoscopy;  Laterality: N/A;   MASS EXCISION  Right 02/20/2018   Procedure: RIGHT THUMB EXCISION MASS AND DEBRIDEMENT DISTAL INTRPHLANGEAL JOINT;  Surgeon: Murrell Drivers, MD;  Location: [facility];  Service: Orthopedics;  Laterality: Right;   POLYPECTOMY  07/13/2019   Procedure: POLYPECTOMY;  Surgeon: Rollin Dover, MD;  Location: [facility] ENDOSCOPY;  Service: Endoscopy;;   right knee      meniscus removal 6 years ago   SHOULDER SURGERY Left    Rotator Cuff Repair   TEE WITHOUT CARDIOVERSION N/A 05/19/2015   Procedure: TRANSESOPHAGEAL ECHOCARDIOGRAM (TEE);  Surgeon: Vina Okey GAILS, MD;  Location: [facility] ENDOSCOPY;  Service: Cardiovascular;  Laterality: N/A;   Family History  Problem Relation Age of Onset   Hypotension Mother    Heart disease Sister    Outpatient Medications Prior to Visit  Medication Sig Dispense Refill   acetaminophen  (TYLENOL ) 500 MG tablet Take 1,000 mg by mouth every 6 (six) hours as needed for moderate pain.     apixaban  (ELIQUIS ) 5 MG TABS tablet Take 5 mg by mouth 2 (two) times daily.     Cholecalciferol  (VITAMIN D3) 2000 UNITS TABS Take 4,000 Units by mouth daily.      diltiazem  (CARDIZEM  CD) 120 MG 24 hr capsule Take 1 capsule (120 mg total) by mouth 2 (two) times daily. 180 capsule 3   dronedarone  (MULTAQ ) 400 MG tablet Take 1 tablet (400 mg total) by mouth 2 (two) times daily with a meal. 30 tablet 0   finasteride (PROSCAR) 5 MG tablet Take 5 mg by mouth daily.     fluticasone  (FLONASE ) 50 MCG/ACT nasal spray Place 1 spray into both nostrils at bedtime.     furosemide  (LASIX ) 20 MG tablet Take 1 tablet (20 mg total) by mouth daily. Take 2 tabs daily for 3 days, then reduce dose to one tablet daily. 90 tablet 3   hydrochlorothiazide  (HYDRODIURIL ) 25 MG tablet TAKE 1 TABLET (25 MG TOTAL) BY MOUTH DAILY. 90 tablet 3   loratadine  (CLARITIN ) 10 MG tablet Take 10 mg by mouth daily.     Magnesium 250 MG TABS Take 250 mg by mouth 2 (two) times daily.     methocarbamol  (ROBAXIN ) 500 MG tablet Take 500 mg by mouth as needed for muscle spasms.     metoprolol  tartrate (LOPRESSOR ) 25 MG tablet TAKE 1 AND 1/2 TABLETS BY MOUTH TWICE DAILY 270 tablet 1   Multiple Vitamins-Minerals (MULTIVITAMIN ADULT) CHEW Chew 2 each by mouth daily.     tamsulosin  (FLOMAX ) 0.4 MG CAPS capsule Take 2 capsules (0.8 mg total) by mouth daily. 180 capsule 1   trolamine salicylate  (ASPERCREME) 10 % cream Apply 1 application topically as needed for muscle pain.     amoxicillin -clavulanate (AUGMENTIN ) 875-125 MG tablet Take 1 tablet by mouth 2 (two) times daily. 14 tablet 0   No facility-administered medications prior to visit.   No Known Allergies   ROS: A complete ROS was performed with pertinent positives/negatives noted in the HPI. The remainder of the ROS are negative.    Objective:   Today's Vitals   07/10/24 1053  BP: (!) 146/83  Pulse: 71  SpO2: 99%  Weight: (!) 307 lb (139.3 kg)  Height: 6' 1 (1.854 m)    GENERAL: Well-appearing, in NAD. Well nourished.  SKIN: Pink, warm and dry. No rash, lesion, ulceration, or ecchymoses.  Head: Normocephalic. Mild right sided pre-auricular swelling present NECK: Trachea midline. Full ROM w/o pain or tenderness. No lymphadenopathy.  EARS: Right TM is injected, cloudy and suppurative but intact  without drainage. Left Tympanic membrane is intact, translucent without bulging and without drainage. Appropriate landmarks visualized.  EYES: Conjunctiva clear without exudates. EOMI, PERRL, no drainage present.  NOSE: Septum midline w/o deformity. Nares patent, mucosa pink and non-inflamed w/o drainage. No sinus tenderness.  THROAT: Uvula midline. Oropharynx clear. Mucous membranes pink and moist.  RESPIRATORY: Chest wall symmetrical. Respirations even and non-labored. Breath sounds clear to auscultation bilaterally.  CARDIAC: S1, S2 present, regular rate and rhythm without murmur or gallops. Peripheral pulses 2+ bilaterally.  MSK: Muscle tone and strength appropriate for age.   NEUROLOGIC: No motor or sensory deficits. Steady, even gait. C2-C12 intact.  PSYCH/MENTAL STATUS: Alert, oriented x 3. Cooperative, appropriate mood and affect.     Assessment & Plan:  1. Acute otitis media, unspecified otitis media type (Primary) Will start Cefdinir 300mg  BID x 7 days given recurrence of AOM to right ear with lymphadenopathy.  Recommend follow up in 1-2 weeks with PCP if no improvement.    Meds ordered this encounter  Medications   cefdinir (OMNICEF) 300 MG capsule    Sig: Take 1 capsule (300 mg total) by mouth 2 (two) times daily.    Dispense:  14 capsule    Refill:  0    Supervising Provider:   DE CUBA, RAYMOND J [[id number]]   Lab Orders  No laboratory test(s) ordered today   Return if symptoms worsen or fail to improve.    Patient to reach out to office if new, worrisome, or unresolved symptoms arise or if no improvement in patient's condition. Patient verbalized understanding and is agreeable to treatment plan. All questions answered to patient's satisfaction.    Thersia Schuyler Stark, [place]

## 2024-07-11 LAB — GENECONNECT MOLECULAR SCREEN: Genetic Analysis Overall Interpretation: NEGATIVE

## 2024-07-19 NOTE — Progress Notes (Signed)
 AMADOU KATZENSTEIN                                          MRN: [id number]   07/19/2024   The [facility] Quality Team Specialist reviewed this patient medical record for the purposes of chart review for care gap closure. The following were reviewed: chart review for care gap closure-controlling blood pressure.    [facility] Quality Team

## 2024-07-20 ENCOUNTER — Encounter (HOSPITAL_COMMUNITY): Payer: Self-pay | Admitting: *Deleted

## 2024-07-26 ENCOUNTER — Encounter: Payer: Self-pay | Admitting: Dermatology

## 2024-07-26 ENCOUNTER — Ambulatory Visit: Admitting: Dermatology

## 2024-07-26 DIAGNOSIS — C4492 Squamous cell carcinoma of skin, unspecified: Secondary | ICD-10-CM

## 2024-07-26 DIAGNOSIS — Z85828 Personal history of other malignant neoplasm of skin: Secondary | ICD-10-CM | POA: Diagnosis not present

## 2024-07-26 DIAGNOSIS — L905 Scar conditions and fibrosis of skin: Secondary | ICD-10-CM

## 2024-07-26 DIAGNOSIS — W908XXA Exposure to other nonionizing radiation, initial encounter: Secondary | ICD-10-CM

## 2024-07-26 DIAGNOSIS — L578 Other skin changes due to chronic exposure to nonionizing radiation: Secondary | ICD-10-CM | POA: Diagnosis not present

## 2024-07-26 NOTE — Progress Notes (Unsigned)
° °  Follow Up Visit   Subjective  Victor Medina is a 78 y.o. male who presents for the following: follow up from Mohs surgery   The patient presents for follow up from Mohs surgery for a SCC on the right cheek, treated on 10/18/23, repaired with linear closure. The patient has been bandaging the wound as directed. The endorse the following concerns: none   The following portions of the chart were reviewed this encounter and updated as appropriate: medications, allergies, medical history  Review of Systems:  No other skin or systemic complaints except as noted in HPI or Assessment and Plan.  Objective  Well appearing patient in no apparent distress; mood and affect are within normal limits.  A focal examination was performed including scalp, head, face. All findings within normal limits unless otherwise noted below.  Healing wound with mild erythema  Relevant physical exam findings are noted in the Assessment and Plan.    Assessment & Plan    Healing Wound s/p Mohs for SCC, treated on 10/18/23, repaired with linear closure - Reassured that wound is healing well - No evidence of infection - No swelling, induration, purulence, dehiscence, or tenderness out of proportion to the clinical exam, see photo above - Discussed that scars take up to 12 months to mature from the date of surgery - Recommend SPF 30+ to scar daily to prevent purple color from UV exposure during scar maturation process - Discussed that erythema and raised appearance of scar will fade over the next 4-6 months - OK to start scar massage at 4-6 weeks post-op - Can consider silicone based products for scar healing starting at 6 weeks post-op     No follow-ups on file.  I, Victor Medina, CMA, am acting as scribe for Victor CHRISTELLA HOLY, MD.   Documentation: I have reviewed the above documentation for accuracy and completeness, and I agree with the above.  Victor CHRISTELLA HOLY, MD

## 2024-07-26 NOTE — Patient Instructions (Signed)

## 2024-09-07 ENCOUNTER — Encounter (HOSPITAL_BASED_OUTPATIENT_CLINIC_OR_DEPARTMENT_OTHER): Payer: Self-pay

## 2024-09-13 ENCOUNTER — Ambulatory Visit (HOSPITAL_BASED_OUTPATIENT_CLINIC_OR_DEPARTMENT_OTHER)

## 2024-09-13 ENCOUNTER — Ambulatory Visit (HOSPITAL_BASED_OUTPATIENT_CLINIC_OR_DEPARTMENT_OTHER): Admitting: Student

## 2024-09-13 DIAGNOSIS — M25561 Pain in right knee: Secondary | ICD-10-CM

## 2024-09-13 DIAGNOSIS — M170 Bilateral primary osteoarthritis of knee: Secondary | ICD-10-CM | POA: Diagnosis not present

## 2024-09-13 DIAGNOSIS — M25562 Pain in left knee: Secondary | ICD-10-CM | POA: Diagnosis not present

## 2024-09-13 NOTE — Progress Notes (Signed)
 "                                Chief Complaint: Bilateral knee pain     History of Present Illness:   Victor Medina is a 79 y.o. male with known bilateral knee osteoarthritis who presents today for evaluation after a recent fall.  6 days ago, patient slipped due to icy conditions and had difficulty getting back on his feet.  He was helped by a neighbor.  Since then, he has had increased pain particularly within the left knee with some increases in bruising and swelling.  He has been taking Tylenol  and using topical lidocaine  with some mild relief.  Last cortisone injections were performed on 06/26/2024.  Surgical History:   None  PMH/PSH/Family History/Social History/Meds/Allergies:    Past Medical History:  Diagnosis Date   Acquired dilation of ascending aorta and aortic root    41 mm and 40 mm respectively   Arthritis    Asthma    Cancer (HCC)    mild skin   Chronic diastolic CHF (congestive heart failure) (HCC)    GERD (gastroesophageal reflux disease)    Hypertension    Morbid obesity (HCC)    OSA (obstructive sleep apnea)    PAF (paroxysmal atrial fibrillation) (HCC) 08/14/2015   s/p TEE/DCCV to NSR;  denies palplita    RBBB    atrial fib on cardizem    Past Surgical History:  Procedure Laterality Date   ANTERIOR CERVICAL DECOMP/DISCECTOMY FUSION N/A 01/13/2023   Procedure: CERVICAL 4- CERVICAL 5, CERVICAL 5- CERVICAL 6 ANTERIOR CERVICAL DECOMPRESSION FUSION WITH INSTRUMENTATION AND ALLOGRAFT;  Surgeon: Beuford Anes, MD;  Location: [facility] OR;  Service: Orthopedics;  Laterality: N/A;   CARDIOVERSION N/A 05/19/2015   Procedure: CARDIOVERSION;  Surgeon: Vina Okey GAILS, MD;  Location: [facility] ENDOSCOPY;  Service: Cardiovascular;  Laterality: N/A;   CARDIOVERSION N/A 12/17/2021   Procedure: CARDIOVERSION;  Surgeon: Okey Vina GAILS, MD;  Location: [facility] ENDOSCOPY;  Service: Cardiovascular;  Laterality: N/A;   CATARACT EXTRACTION W/ INTRAOCULAR LENS IMPLANT Right    CHOLECYSTECTOMY N/A  12/01/2012   Procedure: LAPAROSCOPIC CHOLECYSTECTOMY;  Surgeon: Lynwood MALVA Pina, MD;  Location: [facility] OR;  Service: General;  Laterality: N/A;   COLONOSCOPY WITH PROPOFOL  N/A 07/13/2019   Procedure: COLONOSCOPY WITH PROPOFOL ;  Surgeon: Rollin Dover, MD;  Location: [facility] ENDOSCOPY;  Service: Endoscopy;  Laterality: N/A;   MASS EXCISION Right 02/20/2018   Procedure: RIGHT THUMB EXCISION MASS AND DEBRIDEMENT DISTAL INTRPHLANGEAL JOINT;  Surgeon: Murrell Drivers, MD;  Location: [facility];  Service: Orthopedics;  Laterality: Right;   POLYPECTOMY  07/13/2019   Procedure: POLYPECTOMY;  Surgeon: Rollin Dover, MD;  Location: [facility] ENDOSCOPY;  Service: Endoscopy;;   right knee     meniscus removal 6 years ago   SHOULDER SURGERY Left    Rotator Cuff Repair   TEE WITHOUT CARDIOVERSION N/A 05/19/2015   Procedure: TRANSESOPHAGEAL ECHOCARDIOGRAM (TEE);  Surgeon: Vina Okey GAILS, MD;  Location: [facility] ENDOSCOPY;  Service: Cardiovascular;  Laterality: N/A;   Social History   Socioeconomic History   Marital status: Married    Spouse name: Not on file   Number of children: Not on file   Years of education: Not on file   Highest education level: Associate degree: occupational, scientist, product/process development, or vocational program  Occupational History   Not on file  Tobacco Use   Smoking status: Former    Types: Cigarettes  Passive exposure: Past   Smokeless tobacco: Never   Tobacco comments:    Quit 45 years ago as of 2024  Vaping Use   Vaping status: Never Used  Substance and Sexual Activity   Alcohol use: Not Currently   Drug use: No   Sexual activity: Never  Other Topics Concern   Not on file  Social History Narrative   Not on file   Social Drivers of Health   Tobacco Use: Medium Risk (07/26/2024)   Patient History    Smoking Tobacco Use: Former    Smokeless Tobacco Use: Never    Passive Exposure: Past  Physicist, Medical Strain: Low Risk (06/25/2024)   Overall Financial Resource Strain (CARDIA)     Difficulty of Paying Living Expenses: Not hard at all  Food Insecurity: No Food Insecurity (06/25/2024)   Epic    Worried About Programme Researcher, Broadcasting/film/video in the Last Year: Never true    Ran Out of Food in the Last Year: Never true  Transportation Needs: No Transportation Needs (06/25/2024)   Epic    Lack of Transportation (Medical): No    Lack of Transportation (Non-Medical): No  Physical Activity: Sufficiently Active (06/25/2024)   Exercise Vital Sign    Days of Exercise per Week: 3 days    Minutes of Exercise per Session: 60 min  Stress: No Stress Concern Present (06/25/2024)   [employer] of Occupational Health - Occupational Stress Questionnaire    Feeling of Stress: Not at all  Social Connections: Socially Isolated (06/25/2024)   Social Connection and Isolation Panel    Frequency of Communication with Friends and Family: Never    Frequency of Social Gatherings with Friends and Family: Never    Attends Religious Services: Never    Database Administrator or Organizations: No    Attends Engineer, Structural: Not on file    Marital Status: Married  Depression (PHQ2-9): Low Risk (02/16/2024)   Depression (PHQ2-9)    PHQ-2 Score: 0  Alcohol Screen: Low Risk (04/02/2022)   Alcohol Screen    Last Alcohol Screening Score (AUDIT): 0  Housing: High Risk (06/25/2024)   Epic    Unable to Pay for Housing in the Last Year: Yes    Number of Times Moved in the Last Year: Not on file    Homeless in the Last Year: No  Utilities: Not At Risk (09/27/2023)   [employer] Utilities    Threatened with loss of utilities: No  Health Literacy: Adequate Health Literacy (05/03/2023)   B1300 Health Literacy    Frequency of need for help with medical instructions: Never   Family History  Problem Relation Age of Onset   Hypotension Mother    Heart disease Sister    No Known Allergies  Current Outpatient Medications  Medication Sig Dispense Refill   acetaminophen  (TYLENOL ) 500 MG tablet Take 1,000  mg by mouth every 6 (six) hours as needed for moderate pain.     apixaban  (ELIQUIS ) 5 MG TABS tablet Take 5 mg by mouth 2 (two) times daily.     cefdinir  (OMNICEF ) 300 MG capsule Take 1 capsule (300 mg total) by mouth 2 (two) times daily. 14 capsule 0   Cholecalciferol  (VITAMIN D3) 2000 UNITS TABS Take 4,000 Units by mouth daily.      diltiazem  (CARDIZEM  CD) 120 MG 24 hr capsule Take 1 capsule (120 mg total) by mouth 2 (two) times daily. 180 capsule 3   finasteride (PROSCAR) 5 MG tablet Take 5 mg  by mouth daily.     fluticasone  (FLONASE ) 50 MCG/ACT nasal spray Place 1 spray into both nostrils at bedtime.     furosemide  (LASIX ) 20 MG tablet Take 1 tablet (20 mg total) by mouth daily. Take 2 tabs daily for 3 days, then reduce dose to one tablet daily. 90 tablet 3   hydrochlorothiazide  (HYDRODIURIL ) 25 MG tablet TAKE 1 TABLET (25 MG TOTAL) BY MOUTH DAILY. 90 tablet 3   loratadine  (CLARITIN ) 10 MG tablet Take 10 mg by mouth daily.     Magnesium 250 MG TABS Take 250 mg by mouth 2 (two) times daily.     methocarbamol  (ROBAXIN ) 500 MG tablet Take 500 mg by mouth as needed for muscle spasms.     metoprolol  tartrate (LOPRESSOR ) 25 MG tablet TAKE 1 AND 1/2 TABLETS BY MOUTH TWICE DAILY 270 tablet 1   Multiple Vitamins-Minerals (MULTIVITAMIN ADULT) CHEW Chew 2 each by mouth daily.     tamsulosin  (FLOMAX ) 0.4 MG CAPS capsule Take 2 capsules (0.8 mg total) by mouth daily. 180 capsule 1   trolamine salicylate (ASPERCREME) 10 % cream Apply 1 application topically as needed for muscle pain.     No current facility-administered medications for this visit.   No results found.  Review of Systems:   A ROS was performed including pertinent positives and negatives as documented in the HPI.  Physical Exam :   Constitutional: NAD and appears stated age Neurological: Alert and oriented Psych: Appropriate affect and cooperative There were no vitals taken for this visit.   Comprehensive Musculoskeletal Exam:     Mild effusion and anterior ecchymosis noted of the left knee.  No significant patellar tenderness.  Stable collaterals with varus and valgus stress.  Bilateral active range of motion from 0 to 110 degrees with crepitus.  Tenderness over bilateral medial joint lines.   Imaging:   Xray (left knee 4 views): Tricompartmental osteoarthritis that appears bone-on-bone within the medial compartment.  No evidence of acute fracture or bony abnormality.   I personally reviewed and interpreted the radiographs.   Assessment:   79 y.o. male with history of chronic bilateral knee pain in the setting of advanced osteoarthritis which is bone-on-bone in the medial compartments.  He did have a recent fall on the ice with impact of his left knee although x-rays today show no evidence of acute abnormality.  Suspect that his increased pain is due to an arthritis flare.  His last injections were 2-1/2 months ago which gave him some relief.  He has been working on weight loss, although states that this has recently stalled despite being diligent about his diet.  BMI calculation today puts him at 39.4, which I did discuss makes him a candidate for total knee replacement.  He would like to defer referral today and return once he is eligible for repeat injections, although he will still consider discussing this further with the surgeon.  Also discussed resources for weight loss management but in the meantime he will continue this on his own.  Plan :    - Return to clinic on 2/18 or later for repeat cortisone injections     I personally saw and evaluated the patient, and participated in the management and treatment plan.   Leonce Reveal, PA-C Orthopedics  This document was dictated using Conservation officer, historic buildings. A reasonable attempt at proof reading has been made to minimize errors. "

## 2024-09-27 ENCOUNTER — Ambulatory Visit (HOSPITAL_BASED_OUTPATIENT_CLINIC_OR_DEPARTMENT_OTHER): Admitting: Student
